# Patient Record
Sex: Female | Born: 1975 | Race: Black or African American | Hispanic: No | Marital: Married | Smoking: Never smoker
Health system: Southern US, Community
[De-identification: ages and names within clinical notes are randomized; demographics above are authoritative.]

## PROBLEM LIST (undated history)

## (undated) DIAGNOSIS — K760 Fatty (change of) liver, not elsewhere classified: Secondary | ICD-10-CM

## (undated) DIAGNOSIS — Z8619 Personal history of other infectious and parasitic diseases: Secondary | ICD-10-CM

## (undated) DIAGNOSIS — E119 Type 2 diabetes mellitus without complications: Secondary | ICD-10-CM

## (undated) DIAGNOSIS — M7989 Other specified soft tissue disorders: Secondary | ICD-10-CM

## (undated) DIAGNOSIS — K5900 Constipation, unspecified: Secondary | ICD-10-CM

## (undated) DIAGNOSIS — G43909 Migraine, unspecified, not intractable, without status migrainosus: Secondary | ICD-10-CM

## (undated) DIAGNOSIS — I10 Essential (primary) hypertension: Secondary | ICD-10-CM

## (undated) DIAGNOSIS — K824 Cholesterolosis of gallbladder: Secondary | ICD-10-CM

## (undated) DIAGNOSIS — E7800 Pure hypercholesterolemia, unspecified: Secondary | ICD-10-CM

## (undated) DIAGNOSIS — K429 Umbilical hernia without obstruction or gangrene: Principal | ICD-10-CM

## (undated) DIAGNOSIS — D649 Anemia, unspecified: Secondary | ICD-10-CM

## (undated) DIAGNOSIS — K7689 Other specified diseases of liver: Secondary | ICD-10-CM

## (undated) HISTORY — DX: Essential (primary) hypertension: I10

## (undated) HISTORY — DX: Fatty (change of) liver, not elsewhere classified: K76.0

## (undated) HISTORY — DX: Cholesterolosis of gallbladder: K82.4

## (undated) HISTORY — DX: Pure hypercholesterolemia, unspecified: E78.00

## (undated) HISTORY — DX: Constipation, unspecified: K59.00

## (undated) HISTORY — DX: Migraine, unspecified, not intractable, without status migrainosus: G43.909

## (undated) HISTORY — DX: Other specified diseases of liver: K76.89

## (undated) HISTORY — DX: Anemia, unspecified: D64.9

## (undated) HISTORY — DX: Umbilical hernia without obstruction or gangrene: K42.9

## (undated) HISTORY — DX: Personal history of other infectious and parasitic diseases: Z86.19

## (undated) HISTORY — DX: Other specified soft tissue disorders: M79.89

---

## 2005-07-21 HISTORY — PX: HERNIA REPAIR: SHX51

## 2006-02-18 ENCOUNTER — Ambulatory Visit: Payer: Self-pay

## 2006-04-02 ENCOUNTER — Other Ambulatory Visit: Payer: Self-pay

## 2006-04-09 ENCOUNTER — Ambulatory Visit: Payer: Self-pay | Admitting: General Surgery

## 2009-11-29 ENCOUNTER — Ambulatory Visit: Payer: Self-pay | Admitting: Unknown Physician Specialty

## 2012-07-15 ENCOUNTER — Emergency Department: Payer: Self-pay | Admitting: Emergency Medicine

## 2012-07-15 LAB — URINALYSIS, COMPLETE
Bilirubin,UR: NEGATIVE
Blood: NEGATIVE
Nitrite: NEGATIVE
Protein: NEGATIVE
RBC,UR: <1 /HPF (ref 0–5)
Specific Gravity: 1.018 (ref 1.003–1.030)
Squamous Epithelial: 5

## 2012-07-15 LAB — CBC
MCH: 26.1 pg (ref 26.0–34.0)
MCHC: 31.5 g/dL — ABNORMAL LOW (ref 32.0–36.0)
Platelet: 337 10*3/uL (ref 150–440)
RBC: 4.52 10*6/uL (ref 3.80–5.20)
WBC: 8.7 10*3/uL (ref 3.6–11.0)

## 2012-07-15 LAB — COMPREHENSIVE METABOLIC PANEL
Albumin: 3.5 g/dL (ref 3.4–5.0)
Anion Gap: 7 (ref 7–16)
BUN: 7 mg/dL (ref 7–18)
Bilirubin,Total: 0.4 mg/dL (ref 0.2–1.0)
Calcium, Total: 8.9 mg/dL (ref 8.5–10.1)
Chloride: 106 mmol/L (ref 98–107)
Creatinine: 0.88 mg/dL (ref 0.60–1.30)
EGFR (African American): >60
EGFR (Non-African Amer.): >60
Glucose: 92 mg/dL (ref 65–99)
Osmolality: 273 (ref 275–301)
Potassium: 3.7 mmol/L (ref 3.5–5.1)
Sodium: 138 mmol/L (ref 136–145)
Total Protein: 8.1 g/dL (ref 6.4–8.2)

## 2012-07-15 LAB — LIPASE, BLOOD: Lipase: 73 U/L (ref 73–393)

## 2012-07-21 DIAGNOSIS — K429 Umbilical hernia without obstruction or gangrene: Secondary | ICD-10-CM

## 2012-07-21 HISTORY — DX: Umbilical hernia without obstruction or gangrene: K42.9

## 2012-10-12 ENCOUNTER — Encounter: Payer: Self-pay | Admitting: Internal Medicine

## 2012-10-12 ENCOUNTER — Ambulatory Visit (INDEPENDENT_AMBULATORY_CARE_PROVIDER_SITE_OTHER): Payer: No Typology Code available for payment source | Admitting: Internal Medicine

## 2012-10-12 DIAGNOSIS — I10 Essential (primary) hypertension: Secondary | ICD-10-CM

## 2012-10-12 DIAGNOSIS — N946 Dysmenorrhea, unspecified: Secondary | ICD-10-CM

## 2012-10-12 NOTE — Patient Instructions (Addendum)
  Return for fasting labs at your convenience  Lab appt)  Schedule follow up visit a few days later  Ask hubby about snoring and breathing pattern  Food diary 3 days   Referral to Dr Evelene Croon for evaluation of fibroids

## 2012-10-12 NOTE — Progress Notes (Signed)
Patient ID: Nicole Burns, female   DOB: 24-Nov-1975, 37 y.o.   MRN: [id number]    Patient Active Problem List  Diagnosis  . Obesity, morbid  . Dysmenorrhea  . Essential hypertension, benign    Subjective:  CC:   Chief Complaint  Patient presents with  . Establish Care    HPI:   Nicole Burns is a 37 y.o. female who presents as a new patient to establish primary care with the chief complaint of Transferring from [facility].  No labs in over one year.    1) Hypertension, diastolic. History of e and levated bp with each pregnancy which would resolve.  Not taking hctz every day home (bp checks usually 134/90)   bc it gives her cramps and headaches. Snores. Has low energy during the day,  Has tried taking vitamins.  Has changed diet since her father came to live with her July last year after his hospitalization  For CABG x 4 preceded by a  hip fracture . Will and a will also has a history of Diabetes, tobacco abuse.   2) dysmenorrhea and in her menstrual cycle is regular but her bleeds are very heavy and accompanied by extreme irritability and severe cramping. She's had prior evaluation with transvaginal ultrasound which showed fibroids. This was done by by Dr. Barnabas Lister who attempted to place a Mirena but was unable to do so because of the pelvic anatomy.  She has 2 children and has no interest in enlarging her family. She is ready to have a hysterectomy..  Gets very cold during menses but has not been told she has developed anemia.  Wants to see Dr Logan Bores or Dr. Evelene Croon.Marland Kitchen   3) Reduced libido:  She fears intercourse because it is painful to her fibroid uterus. She has pain both during and after intercourse.      4) lower extremity edema:  Develops fluid retention on left ankle > right pre menses.  Uses the hctz for that. Had a 24 hour period of recurrent stabbing chest pain which started under her left scapula and radiated to left breast /chest wall during her last cycle.. Lasted  about 24 hours, resolved with use of diuretic.   5) Morbid obesity:  High school wt was 195.  Dropped to 160 after first pregnancy at age 67, then started Depo Provera and has been steadily gaining weight ever since.  After second pregnancy baseline became 200,  Which represents a 40 lb wt gain in ten years.  Dr Welton Flakes tried an appetite suppressant and diet plan which was moderately successful until she had a episode of SVT which resulted in stopping the medication.  She had some success with  Wt Watchers on her 2nd attempt because the knees were done at work during her lunch break up. However when meetings were no longer held at work she lost her momentum and has been unable to lose any weight since then. She has considered gastric bypass but would like to avoid surgery if possible.     6) Has some insomnia issues due to home life ,  She is troubled by the recent unusual behavior over 16 year old daughter who is living at home and beginning to rebel.     Past Medical History  Diagnosis Date  . History of chicken pox   . Hypertension   . Migraine     After menses cycle.     Past Surgical History  Procedure Laterality Date  . Hernia repair  2005  .  Cesarean section      1993 & 2003    Family History  Problem Relation Age of Onset  . Hypertension Mother   . Hyperlipidemia Father   . Heart disease Father   . Stroke Father   . Hypertension Father   . Diabetes Father   . Hyperlipidemia Maternal Grandmother   . Cancer Maternal Grandmother     Breast  . Kidney disease Paternal Grandmother   . Diabetes Paternal Grandmother     History   Social History  . Marital Status: Married    Spouse Name: N/A    Number of Children: N/A  . Years of Education: N/A   Occupational History  . Not on file.   Social History Main Topics  . Smoking status: Never Smoker   . Smokeless tobacco: Not on file  . Alcohol Use: No  . Drug Use: No  . Sexually Active: Yes   Other Topics Concern  . Not  on file   Social History Narrative  . No narrative on file   No Known Allergies  Review of Systems:   Patient denies headache, fevers, malaise, unintentional weight loss, skin rash, eye pain, sinus congestion and sinus pain, sore throat, dysphagia,  hemoptysis , cough, dyspnea, wheezing, chest pain, palpitations, orthopnea, edema, abdominal pain, nausea, melena, diarrhea, constipation, flank pain, dysuria, hematuria, urinary  Frequency, nocturia, numbness, tingling, seizures,  Focal weakness, Loss of consciousness,  Tremor, insomnia, depression, anxiety, and suicidal ideation.       Objective:  BP 134/90  Pulse 79  Temp(Src) 98.6 F (37 C) (Oral)  Resp 16  Ht 5' 4.25" (1.632 m)  Wt 271 lb 8 oz (123.152 kg)  BMI 46.24 kg/m2  SpO2 97%  LMP 10/05/2012  General appearance: alert, cooperative and appears stated age Ears: normal TM's and external ear canals both ears Throat: lips, mucosa, and tongue normal; teeth and gums normal Neck: no adenopathy, no carotid bruit, supple, symmetrical, trachea midline and thyroid not enlarged, symmetric, no tenderness/mass/nodules Back: symmetric, no curvature. ROM normal. No CVA tenderness. Lungs: clear to auscultation bilaterally Heart: regular rate and rhythm, S1, S2 normal, no murmur, click, rub or gallop Abdomen: soft, non-tender; bowel sounds normal; no masses,  no organomegaly Pulses: 2+ and symmetric Skin: Skin color, texture, turgor normal. No rashes or lesions Lymph nodes: Cervical, supraclavicular, and axillary nodes normal.  Assessment and Plan:  Obesity, morbid I have addressed  BMI and recommended a low glycemic index diet utilizing smaller more frequent meals to increase metabolism.  I have also recommended that patient start exercising with a goal of 30 minutes of aerobic exercise a minimum of 5 days per week. Screening for lipid disorders, thyroid and diabetes to be done today.    Dysmenorrhea With history of fibroid uterus  causing severe cramping , dyspareunia and heavy menses. Referral to Dr. Evelene Croon for consideration of  hysterectomy.  Essential hypertension, benign Mostly diastolic with history of hypertension during pregnancy. Return for for assessment of renal function .  She has not tolerated hydrochlorothiazide in the past due to cramping. We'll assess renal function and electrolytes and consider ACE inhibitor    A total of 60 minutes was spent with patient more than half of which was spent in counseling, reviewing records from other prviders and coordination of care. Updated Medication List  Outpatient Encounter Prescriptions as of 10/12/2012  Medication Sig Dispense Refill  . hydrochlorothiazide (HYDRODIURIL) 25 MG tablet Pt takes 1 tablet every other day, due to side effects.      Marland Kitchen  Multiple Vitamin (MULTIVITAMIN) tablet Take 1 tablet by mouth daily.      . pantoprazole (PROTONIX) 40 MG tablet        No facility-administered encounter medications on file as of 10/12/2012.     Orders Placed This Encounter  Procedures  . Ambulatory referral to Gynecology    No Follow-up on file.

## 2012-10-13 ENCOUNTER — Encounter: Payer: Self-pay | Admitting: Internal Medicine

## 2012-10-13 DIAGNOSIS — I10 Essential (primary) hypertension: Secondary | ICD-10-CM | POA: Insufficient documentation

## 2012-10-13 DIAGNOSIS — N943 Premenstrual tension syndrome: Secondary | ICD-10-CM | POA: Insufficient documentation

## 2012-10-13 NOTE — Assessment & Plan Note (Signed)
Mostly diastolic with history of hypertension during pregnancy. Return for for assessment of renal function .  She has not tolerated hydrochlorothiazide in the past due to cramping. We'll assess renal function and electrolytes and consider ACE inhibitor

## 2012-10-13 NOTE — Assessment & Plan Note (Signed)
I have addressed  BMI and recommended a low glycemic index diet utilizing smaller more frequent meals to increase metabolism.  I have also recommended that patient start exercising with a goal of 30 minutes of aerobic exercise a minimum of 5 days per week. Screening for lipid disorders, thyroid and diabetes to be done today.   

## 2012-10-13 NOTE — Assessment & Plan Note (Signed)
With history of fibroid uterus causing severe cramping , dyspareunia and heavy menses. Referral to Dr. Evelene Croon for consideration of  hysterectomy.

## 2012-10-26 ENCOUNTER — Telehealth: Payer: Self-pay | Admitting: *Deleted

## 2012-10-26 DIAGNOSIS — R739 Hyperglycemia, unspecified: Secondary | ICD-10-CM

## 2012-10-26 NOTE — Telephone Encounter (Signed)
Pt is coming in for labs tomorrow 04.09.2014 what labs and dx code would you like?

## 2012-10-26 NOTE — Telephone Encounter (Signed)
Labs entered, thanks!

## 2012-10-27 ENCOUNTER — Other Ambulatory Visit (INDEPENDENT_AMBULATORY_CARE_PROVIDER_SITE_OTHER): Payer: No Typology Code available for payment source

## 2012-10-27 DIAGNOSIS — R739 Hyperglycemia, unspecified: Secondary | ICD-10-CM

## 2012-10-27 DIAGNOSIS — R7309 Other abnormal glucose: Secondary | ICD-10-CM

## 2012-10-27 LAB — COMPREHENSIVE METABOLIC PANEL
Albumin: 3.7 g/dL (ref 3.5–5.2)
Alkaline Phosphatase: 59 U/L (ref 39–117)
BUN: 10 mg/dL (ref 6–23)
CO2: 24 mEq/L (ref 19–32)
Calcium: 9 mg/dL (ref 8.4–10.5)
GFR: 86.3 mL/min (ref >60.00)
Glucose, Bld: 118 mg/dL — ABNORMAL HIGH (ref 70–99)
Potassium: 3.9 mEq/L (ref 3.5–5.1)

## 2012-10-27 LAB — LIPID PANEL
Cholesterol: 135 mg/dL (ref 0–200)
LDL Cholesterol: 91 mg/dL (ref 0–99)
Triglycerides: 55 mg/dL (ref 0.0–149.0)

## 2012-10-27 LAB — TSH: TSH: 1.1 u[IU]/mL (ref 0.35–5.50)

## 2012-10-29 ENCOUNTER — Encounter: Payer: Self-pay | Admitting: Internal Medicine

## 2012-10-29 ENCOUNTER — Ambulatory Visit (INDEPENDENT_AMBULATORY_CARE_PROVIDER_SITE_OTHER): Payer: No Typology Code available for payment source | Admitting: Internal Medicine

## 2012-10-29 VITALS — BP 138/85 | HR 95 | Temp 98.7°F | Resp 16 | Wt 268.0 lb

## 2012-10-29 DIAGNOSIS — I10 Essential (primary) hypertension: Secondary | ICD-10-CM

## 2012-10-29 DIAGNOSIS — R197 Diarrhea, unspecified: Secondary | ICD-10-CM

## 2012-10-29 DIAGNOSIS — Z1239 Encounter for other screening for malignant neoplasm of breast: Secondary | ICD-10-CM

## 2012-10-29 DIAGNOSIS — R7309 Other abnormal glucose: Secondary | ICD-10-CM

## 2012-10-29 MED ORDER — LISINOPRIL-HYDROCHLOROTHIAZIDE 10-12.5 MG PO TABS: 1.0000 | ORAL_TABLET | Freq: Every day | ORAL | Status: DC

## 2012-10-29 NOTE — Progress Notes (Signed)
Patient ID: Nicole Burns, female   DOB: [birth year], 37 y.o.   MRN: [id number]  Patient Active Problem List  Diagnosis  . Obesity, morbid  . Dysmenorrhea  . Essential hypertension, benign  . Other abnormal glucose  . Diarrhea of presumed infectious origin    Subjective:  CC:   Chief Complaint  Patient presents with  . Follow-up    From Inital visit    HPI:   Nicole Mitchellis a 37 y.o. female who presents 1 month follow up on hypertension, dysmennorha, edema and obesity.  She is recovering from a Gi illness which started after eating hibachi vegetables with cooked shrimp at [employer]. She had recurrent diarrhea and vomiting for one week but was able to  Maintain hydration.  The vomiting has resolved., but she continues to have two or three liquid stools daily.  She lost 3 lbs because of the illness.  She was referred to GYN for consideration of hysterectomy secondary to severe dysmenorrha and dyspareunia secondary to large fibroid uterus.  Dr. Evelene Croon plants to do a cryoablation on April 20th per patient under conscious sedation.  No notes available,.  She is morbidly obese but has no known history of sleep apnea, although she has hypertension and edema .    Past Medical History  Diagnosis Date  . History of chicken pox   . Hypertension   . Migraine     After menses cycle.     Past Surgical History  Procedure Laterality Date  . Hernia repair  2005  . Cesarean section      1993 & 2003       The following portions of the patient's history were reviewed and updated as appropriate: Allergies, current medications, and problem list.    Review of Systems:   Patient denies headache, fevers, malaise, unintentional weight loss, skin rash, eye pain, sinus congestion and sinus pain, sore throat, dysphagia,  hemoptysis , cough, dyspnea, wheezing, chest pain, palpitations, orthopnea, edema, abdominal pain, nausea, melena, diarrhea, constipation, flank pain,  dysuria, hematuria, urinary  Frequency, nocturia, numbness, tingling, seizures,  Focal weakness, Loss of consciousness,  Tremor, insomnia, depression, anxiety, and suicidal ideation.     History   Social History  . Marital Status: Married    Spouse Name: N/A    Number of Children: N/A  . Years of Education: N/A   Occupational History  . Not on file.   Social History Main Topics  . Smoking status: Never Smoker   . Smokeless tobacco: Not on file  . Alcohol Use: No  . Drug Use: No  . Sexually Active: Yes   Other Topics Concern  . Not on file   Social History Narrative  . No narrative on file    Objective:  BP 138/85  Pulse 95  Temp(Src) 98.7 F (37.1 C) (Oral)  Resp 16  Wt 268 lb (121.564 kg)  BMI 45.64 kg/m2  SpO2 98%  LMP 10/05/2012  General appearance: alert, cooperative and appears stated age Ears: normal TM's and external ear canals both ears Throat: lips, mucosa, and tongue normal; teeth and gums normal Neck: no adenopathy, no carotid bruit, supple, symmetrical, trachea midline and thyroid not enlarged, symmetric, no tenderness/mass/nodules Back: symmetric, no curvature. ROM normal. No CVA tenderness. Lungs: clear to auscultation bilaterally Heart: regular rate and rhythm, S1, S2 normal, no murmur, click, rub or gallop Abdomen: soft, non-tender; bowel sounds normal; no masses,  no organomegaly Pulses: 2+ and symmetric Skin: Skin color, texture, turgor normal. No rashes  or lesions Lymph nodes: Cervical, supraclavicular, and axillary nodes normal.  Assessment and Plan:  Obesity, morbid Screening done at first visit shows normal cholesterol and thyroid function, and abnormal fasting glucose of 118 and hgba1c of 6.1 Low GI diet and exercise discussed at length today.  Diet handout given.   Essential hypertension, benign Given borderline dx of DM,  Starting lisinopril/hct .  Asked to return for BMET one week after starting ACE inhibitor to monitor renal  function and K.   Diarrhea of presumed infectious origin Nonbloody, without fevers,  Occurring after local restaurant dining but without fevers and bloody stools unlikely to be shigella or salmonella, still  Could be campylobacter.  Recommended stool cultures if still present on Sunday.  Probiotics and metamucil advised to help bulk up stools    Updated Medication List Outpatient Encounter Prescriptions as of 10/29/2012  Medication Sig Dispense Refill  . hydrochlorothiazide (HYDRODIURIL) 25 MG tablet Pt takes 1 tablet every other day, due to side effects.      Marland Kitchen lisinopril-hydrochlorothiazide (ZESTORETIC) 10-12.5 MG per tablet Take 1 tablet by mouth daily.  30 tablet  2  . Multiple Vitamin (MULTIVITAMIN) tablet Take 1 tablet by mouth daily.      . pantoprazole (PROTONIX) 40 MG tablet        No facility-administered encounter medications on file as of 10/29/2012.     Orders Placed This Encounter  Procedures  . Stool C-Diff Toxin Assay  . Stool Culture  . Stool Giardia/Cryptosporidium  . MM Digital Screening  . Stool, WBC/Lactoferrin  . Basic metabolic panel    No Follow-up on file.

## 2012-10-29 NOTE — Patient Instructions (Addendum)
When you make the switch from hctz to lisinopril/hctz,  Return for blood test (nonfasting ) 1 week later.    I would like you to lose 10%  Of your current body weight over the next 6 months ( to help prevent diabetes )   This is  my version of a  "Low GI"  Diet:  It is not ultra low carb, but will still lower your blood sugars and allow you to lose 5 to 10 lbs per month if you follow it carefully. All of the foods can be found at grocery stores and in bulk at [employer].  The Atkins protein bars and shakes are available in more varieties at [employer], [employer] and [employer].     7 AM Breakfast:  Low carbohydrate Protein  Shakes (I recommend the EAS AdvantEdge "Carb Control" shakes  Or the low carb shakes by Atkins.   Both are available everywhere:  In  cases at [employer]Js  Or in 4 packs at grocery stores and pharmacies  2.5 carbs  (Alternative is  a toasted Arnold's Sandwhich Thin w/ peanut butter, a "Bagel Thin" with cream cheese and salmon) or  a scrambled egg burrito made with a low carb tortilla .  Avoid cereal and bananas, oatmeal too unless you are cooking the old fashioned kind that takes 30-40 minutes to prepare.  the rest is overly processed, has minimal fiber, and is loaded with carbohydrates!   10 AM: Protein bar by Atkins (the snack size, under 200 cal).  There are many varieties , available widely again or in bulk in limited varieties at BJs)  Other so called "protein bars" tend to be loaded with carbohydrates.  Remember, in food advertising, the word "energy" is synonymous for " carbohydrate."  Lunch: sandwich of [place], (or any lunchmeat, grilled meat or canned tuna), fresh avocado, mayonnaise  and cheese on a lower carbohydrate pita bread, flatbread, or tortilla . Ok to use regular mayonnaise. The bread is the only source or carbohydrate that can be decreased (Joseph's makes a pita bread and a flat bread that are 50 cal and 4 net carbs ; Toufayan makes a low carb flatbread that's 100 cal and 9 net  carbs  and  Mission makes a low carb whole wheat tortilla  That is 210 cal and 6 net carbs)  3 PM:  Mid day :  Another protein bar,  Or a  cheese stick (100 cal, 0 carbs),  Or 1 ounce of  almonds, walnuts, pistachios, pecans, peanuts,  Macadamia nuts. Or a Dannon light n Fit greek yogurt, 80 cal 8 net carbs . Avoid "granola"; the dried cranberries and raisins are loaded with carbohydrates. Mixed nuts ok if no raisins or cranberries or dried fruit.      6 PM  Dinner:  "mean and green:"  Meat/chicken/fish or a high protein legume; , with a green salad, and a low GI  Veggie (broccoli, cauliflower, green beans, spinach, brussel sprouts. Lima beans) : Avoid "Low fat dressings, as well as Reyne Dumas and [address]! They are loaded with sugar! Instead use ranch, vinagrette,  Blue cheese, etc.  There is a low carb pasta by Dreamfield's available at [facility] that is acceptable and tastes great. Try Michel Angel's chicken piccata over low carb pasta. The chicken dish is 0 carbs, and can be found in frozen section at BJs and [employer]. Also try [employer]" (pulled pork, no sauce,  0 carbs) and his pot roast.  both are in the refrigerated section at BJs   Dreamfield's makes a low carb pasta only 5 g/serving.  Available at all grocery stores,  And tastes like normal pasta  9 PM snack : Breyer's "low carb" fudgsicle or  ice cream bar (Carb Smart line), or  Weight Watcher's ice cream bar , or another "no sugar added" ice cream;a serving of fresh berries/cherries with whipped cream (Avoid bananas, pineapple, grapes  and watermelon on a regular basis because they are high in sugar)   Remember that snack Substitutions should be less than 10 carbs per serving and meals < 20 carbs. Remember to subtract fiber grams and sugar alcohols to get the "net carbs."

## 2012-10-30 ENCOUNTER — Encounter: Payer: Self-pay | Admitting: Internal Medicine

## 2012-10-31 ENCOUNTER — Encounter: Payer: Self-pay | Admitting: Internal Medicine

## 2012-10-31 DIAGNOSIS — E1169 Type 2 diabetes mellitus with other specified complication: Secondary | ICD-10-CM | POA: Insufficient documentation

## 2012-10-31 DIAGNOSIS — E1159 Type 2 diabetes mellitus with other circulatory complications: Secondary | ICD-10-CM | POA: Insufficient documentation

## 2012-10-31 DIAGNOSIS — R197 Diarrhea, unspecified: Secondary | ICD-10-CM | POA: Insufficient documentation

## 2012-10-31 DIAGNOSIS — E119 Type 2 diabetes mellitus without complications: Secondary | ICD-10-CM | POA: Insufficient documentation

## 2012-10-31 NOTE — Assessment & Plan Note (Signed)
Screening done at first visit shows normal cholesterol and thyroid function, and abnormal fasting glucose of 118 and hgba1c of 6.1 Low GI diet and exercise discussed at length today.  Diet handout given.

## 2012-10-31 NOTE — Assessment & Plan Note (Addendum)
Given borderline dx of DM,  Starting lisinopril/hct .  Asked to return for BMET one week after starting ACE inhibitor to monitor renal function and K.

## 2012-10-31 NOTE — Assessment & Plan Note (Addendum)
Nonbloody, without fevers,  Occurring after local restaurant dining but without fevers and bloody stools unlikely to be shigella or salmonella, still  Could be campylobacter.  Recommended stool cultures if still present on Sunday.  Probiotics and metamucil advised to help bulk up stools

## 2012-12-17 ENCOUNTER — Encounter: Payer: Self-pay | Admitting: Internal Medicine

## 2013-01-14 ENCOUNTER — Other Ambulatory Visit: Payer: Self-pay | Admitting: Adult Health

## 2013-01-14 ENCOUNTER — Ambulatory Visit: Payer: Self-pay | Admitting: Adult Health

## 2013-01-14 ENCOUNTER — Encounter: Payer: Self-pay | Admitting: Adult Health

## 2013-01-14 ENCOUNTER — Ambulatory Visit (INDEPENDENT_AMBULATORY_CARE_PROVIDER_SITE_OTHER): Payer: No Typology Code available for payment source | Admitting: Adult Health

## 2013-01-14 VITALS — BP 132/80 | HR 77 | Temp 98.3°F | Resp 12 | Wt 262.5 lb

## 2013-01-14 DIAGNOSIS — R109 Unspecified abdominal pain: Secondary | ICD-10-CM

## 2013-01-14 DIAGNOSIS — K429 Umbilical hernia without obstruction or gangrene: Secondary | ICD-10-CM

## 2013-01-14 DIAGNOSIS — Z3490 Encounter for supervision of normal pregnancy, unspecified, unspecified trimester: Secondary | ICD-10-CM

## 2013-01-14 DIAGNOSIS — Z331 Pregnant state, incidental: Secondary | ICD-10-CM

## 2013-01-14 LAB — POCT URINE PREGNANCY: Preg Test, Ur: NEGATIVE

## 2013-01-14 LAB — BASIC METABOLIC PANEL
CO2: 29 mEq/L (ref 19–32)
Chloride: 104 mEq/L (ref 96–112)
Creatinine, Ser: 1 mg/dL (ref 0.4–1.2)
Potassium: 3.5 mEq/L (ref 3.5–5.1)
Sodium: 137 mEq/L (ref 135–145)

## 2013-01-14 LAB — HEPATIC FUNCTION PANEL
Albumin: 3.9 g/dL (ref 3.5–5.2)
Alkaline Phosphatase: 57 U/L (ref 39–117)

## 2013-01-14 LAB — CBC WITH DIFFERENTIAL/PLATELET
Basophils Absolute: 0 10*3/uL (ref 0.0–0.1)
Eosinophils Relative: 2.4 % (ref 0.0–5.0)
HCT: 35.9 % — ABNORMAL LOW (ref 36.0–46.0)
Hemoglobin: 11.6 g/dL — ABNORMAL LOW (ref 12.0–15.0)
Lymphs Abs: 2.9 10*3/uL (ref 0.7–4.0)
Monocytes Relative: 9.6 % (ref 3.0–12.0)
Neutro Abs: 5.1 10*3/uL (ref 1.4–7.7)
RDW: 15.4 % — ABNORMAL HIGH (ref 11.5–14.6)

## 2013-01-14 NOTE — Assessment & Plan Note (Signed)
Diffuse throughout abdomen but greater in the epigastric area and left quadrants. Check hCG, CBC, liver panel, metabolic panel and send for CT of the abdomen. Patient also has appointment with GI on Monday. I have advised her to keep that appointment. Will await results for further recommendation and plan of care.

## 2013-01-14 NOTE — Progress Notes (Signed)
  Subjective:    Patient ID: Nicole Burns, female    DOB: 1976-02-09, 37 y.o.   MRN: [id number]  HPI  Patient is a pleasant 37 year old female who presents to clinic with the following concerns: Tuesday - ate fresh fruit for breakfast. Shortly after "felt a war going on in her stomach". She later ate buffalo wings. Developed abdominal pain and vomiting. She was experiencing cramps. She had vomiting on Tuesday night into early Wednesday morning. She then developed diarrhea Thursday x 3. No blood noted. She reports feeling feverish. Patient has no appetite. She has not taken anything OTC. She had some left over Protonix from a previous visit to Urgent care for similar event during Christmas so she has started taking this. Also, patient developed a rash on both lower extremities with this event. She reports the rash feels like a "razor burn". Patient had not shaved. It is painful to touch. She has not taken any new medications. Recently started HCTZ in May. She does not take this on a daily basis.   Current Outpatient Prescriptions on File Prior to Visit  Medication Sig Dispense Refill  . lisinopril-hydrochlorothiazide (ZESTORETIC) 10-12.5 MG per tablet Take 1 tablet by mouth daily.  30 tablet  2  . pantoprazole (PROTONIX) 40 MG tablet Take 40 mg by mouth daily.        No current facility-administered medications on file prior to visit.     Review of Systems  Constitutional: Positive for fever. Negative for chills.  Respiratory: Negative.   Cardiovascular: Negative.   Gastrointestinal: Positive for nausea, vomiting, abdominal pain, diarrhea and abdominal distention. Negative for blood in stool.  Genitourinary: Negative.   Skin: Positive for rash.       Developed rash bilateral LE. The rash was painful. Describes feeling as "razor burn" but she had not shaved.  Allergic/Immunologic: Negative for environmental allergies and food allergies.  Neurological: Positive for headaches.   Psychiatric/Behavioral: Negative.     BP 132/80  Pulse 77  Temp(Src) 98.3 F (36.8 C) (Oral)  Resp 12  Wt 262 lb 8 oz (119.069 kg)  BMI 44.71 kg/m2  SpO2 98%  LMP 01/01/2013    Objective:   Physical Exam  Constitutional: She is oriented to person, place, and time.  Overweight, pleasant female. Appears uncomfortable  Cardiovascular: Normal rate and regular rhythm.   Pulmonary/Chest: Effort normal. No respiratory distress.  Abdominal: Soft. She exhibits no mass. There is tenderness.  Tenderness increased with palpation at the epigastric area and on the left side.  Musculoskeletal: Normal range of motion. She exhibits no edema.  Neurological: She is alert and oriented to person, place, and time. Coordination normal.  Skin: Skin is warm and dry. Rash noted. No erythema.  Pinpoint Papular rash bilateral lower extremities. Mid lower extremity down to right above ankle  Psychiatric: She has a normal mood and affect. Her behavior is normal. Judgment and thought content normal.       Assessment & Plan:

## 2013-01-17 ENCOUNTER — Encounter: Payer: Self-pay | Admitting: General Surgery

## 2013-01-24 ENCOUNTER — Encounter: Payer: Self-pay | Admitting: General Surgery

## 2013-01-24 ENCOUNTER — Ambulatory Visit (INDEPENDENT_AMBULATORY_CARE_PROVIDER_SITE_OTHER): Payer: No Typology Code available for payment source | Admitting: General Surgery

## 2013-01-24 VITALS — BP 132/70 | HR 74 | Resp 14 | Ht 63.0 in | Wt 270.0 lb

## 2013-01-24 DIAGNOSIS — K432 Incisional hernia without obstruction or gangrene: Secondary | ICD-10-CM | POA: Insufficient documentation

## 2013-01-24 DIAGNOSIS — K429 Umbilical hernia without obstruction or gangrene: Secondary | ICD-10-CM | POA: Insufficient documentation

## 2013-01-24 NOTE — Progress Notes (Signed)
Patient ID: Nicole Burns, female   DOB: [birth year], 37 y.o.   MRN: [id number]  Chief Complaint  Patient presents with  . Other    peri-umbilical hernia    HPI Nicole Burns is a 37 y.o. female here today for an umbilical hernia. She states her hernia has been there for years. She has not noticed it getting larger over time. She is non symptomatic at this time. Patient had a CT scan approximately 10 days ago for abdominal pain. The abdominal pain has resolved.   HPI  Past Medical History  Diagnosis Date  . History of chicken pox   . Hypertension   . Migraine     After menses cycle.   Marland Kitchen Umbilical hernia 2014    Past Surgical History  Procedure Laterality Date  . Cesarean section      1993 & 2003  . Hernia repair  2007    Family History  Problem Relation Age of Onset  . Hypertension Mother   . Hyperlipidemia Father   . Heart disease Father   . Stroke Father   . Hypertension Father   . Diabetes Father   . Hyperlipidemia Maternal Grandmother   . Cancer Maternal Grandmother     Breast  . Kidney disease Paternal Grandmother   . Diabetes Paternal Grandmother     Social History History  Substance Use Topics  . Smoking status: Never Smoker   . Smokeless tobacco: Not on file  . Alcohol Use: No    No Known Allergies  Current Outpatient Prescriptions  Medication Sig Dispense Refill  . lisinopril-hydrochlorothiazide (ZESTORETIC) 10-12.5 MG per tablet Take 1 tablet by mouth daily.  30 tablet  2  . pantoprazole (PROTONIX) 40 MG tablet Take 40 mg by mouth daily.        No current facility-administered medications for this visit.    Review of Systems Review of Systems  Constitutional: Negative.   Respiratory: Negative.   Cardiovascular: Negative.     Blood pressure 132/70, pulse 74, resp. rate 14, height 5\' 3"  (1.6 m), weight 270 lb (122.471 kg), last menstrual period 01/01/2013.  Physical Exam Physical Exam  Constitutional: She is oriented to person,  place, and time. She appears well-developed and well-nourished.  Eyes: Conjunctivae are normal. No scleral icterus.  Neck: Trachea normal. No mass and no thyromegaly present.  Cardiovascular: Normal rate, regular rhythm, normal heart sounds and normal pulses.   No murmur heard. Pulmonary/Chest: Effort normal and breath sounds normal.  Abdominal: Soft. Normal appearance and bowel sounds are normal. There is no hepatosplenomegaly. There is no tenderness. A hernia is present.  Umbilical Hernia noted.   Neurological: She is alert and oriented to person, place, and time.  Skin: Skin is warm and dry.    Data Reviewed CT reviewed.   Assessment    In addition to umbilical hernia there is large recurrent ventral hernia below the umbilicus with bowel seen within it.     Plan    Ideal option is an open repair with component separation. This was explained fullyto patient. Reasons, risks and benefits explained in full. Patient wishes to think about this and decide.    Patient to call back when ready to arrange surgery.    SANKAR,SEEPLAPUTHUR G 01/24/2013, 9:41 PM

## 2013-01-24 NOTE — Patient Instructions (Addendum)
Patient advised to have umbilical hernia repair. Patient to call our office back when she is ready to schedule surgery. Patient to contact our office with any new symptoms.    Hernia, Surgical Repair A hernia occurs when an internal organ pushes out through a weak spot in the belly (abdominal) wall muscles. Hernias commonly occur in the groin and around the navel. Hernias often can be pushed back into place (reduced). Most hernias tend to get worse over time. Problems occur when abdominal contents get stuck in the opening (incarcerated hernia). The blood supply gets cut off (strangulated hernia). This is an emergency and needs surgery. Otherwise, hernia repair can be an elective procedure. This means you can schedule this at your convenience when an emergency is not present. Because complications can occur, if you decide to repair the hernia, it is best to do it soon. When it becomes an emergency procedure, there is increased risk of complications after surgery. CAUSES   Heavy lifting.  Obesity.  Prolonged coughing.  Straining to move your bowels.  Hernias can also occur through a cut (incision) by a surgeonafter an abdominal operation. HOME CARE INSTRUCTIONS Before the repair:  Bed rest is not required. You may continue your normal activities, but avoid heavy lifting (more than 10 pounds) or straining. Cough gently. If you are a smoker, it is best to stop. Even the best hernia repair can break down with the continual strain of coughing.  Do not wear anything tight over your hernia. Do not try to keep it in with an outside bandage or truss. These can damage abdominal contents if they are trapped in the hernia sac.  Eat a normal diet. Avoid constipation. Straining over long periods of time to have a bowel movement will increase hernia size. It also can breakdown repairs. If you cannot do this with diet alone, laxatives or stool softeners may be used. PRIOR TO SURGERY, SEEK IMMEDIATE MEDICAL  CARE IF: You have problems (symptoms) of a trapped (incarcerated) hernia. Symptoms include:  An oral temperature above 102 F (38.9 C) develops, or as your caregiver suggests.  Increasing abdominal pain.  Feeling sick to your stomach(nausea) and vomiting.  You stop passing gas or stool.  The hernia is stuck outside the abdomen, looks discolored, feels hard, or is tender.  You have any changes in your bowel habits or in the hernia that is unusual for you. LET YOUR CAREGIVERS KNOW ABOUT THE FOLLOWING:  Allergies.  Medications taken including herbs, eye drops, over the counter medications, and creams.  Use of steroids (by mouth or creams).  Family or personal history of problems with anesthetics or Novocaine.  Possibility of pregnancy, if this applies.  Personal history of blood clots (thrombophlebitis).  Family or personal history of bleeding or blood problems.  Previous surgery.  Other health problems. BEFORE THE PROCEDURE You should be present 1 hour prior to your procedure, or as directed by your caregiver.  AFTER THE PROCEDURE After surgery, you will be taken to the recovery area. A nurse will watch and check your progress there. Once you are awake, stable, and taking fluids well, you will be allowed to go home as long as there are no problems. Once home, an ice pack (wrapped in a light towel) applied to your operative site may help with discomfort. It may also keep the swelling down. Do not lift anything heavier than 10 pounds (4.55 kilograms). Take showers not baths. Do not drive while taking narcotics. Follow instructions as suggested by  your caregiver.  SEEK IMMEDIATE MEDICAL CARE IF: After surgery:  There is redness, swelling, or increasing pain in the wound.  There is pus coming from the wound.  There is drainage from a wound lasting longer than 1 day.  An unexplained oral temperature above 102 F (38.9 C) develops.  You notice a foul smell coming from the  wound or dressing.  There is a breaking open of a wound (edged not staying together) after the sutures have been removed.  You notice increasing pain in the shoulders (shoulder strap areas).  You develop dizzy episodes or fainting while standing.  You develop persistent nausea or vomiting.  You develop a rash.  You have difficulty breathing.  You develop any reaction or side effects to medications given. MAKE SURE YOU:   Understand these instructions.  Will watch your condition.  Will get help right away if you are not doing well or get worse. Document Released: 12/31/2000 Document Revised: 09/29/2011 Document Reviewed: 11/23/2007 [facility] Patient Information 2014 [place], [place].  Patient to call back when ready to arrange surgery.

## 2013-02-01 ENCOUNTER — Encounter: Payer: Self-pay | Admitting: Internal Medicine

## 2013-02-01 ENCOUNTER — Ambulatory Visit (INDEPENDENT_AMBULATORY_CARE_PROVIDER_SITE_OTHER): Payer: No Typology Code available for payment source | Admitting: Internal Medicine

## 2013-02-01 DIAGNOSIS — R7309 Other abnormal glucose: Secondary | ICD-10-CM

## 2013-02-01 DIAGNOSIS — N943 Premenstrual tension syndrome: Secondary | ICD-10-CM

## 2013-02-01 DIAGNOSIS — K429 Umbilical hernia without obstruction or gangrene: Secondary | ICD-10-CM

## 2013-02-01 DIAGNOSIS — I10 Essential (primary) hypertension: Secondary | ICD-10-CM

## 2013-02-01 DIAGNOSIS — N939 Abnormal uterine and vaginal bleeding, unspecified: Secondary | ICD-10-CM

## 2013-02-01 LAB — HM PAP SMEAR: HM Pap smear: NORMAL

## 2013-02-01 MED ORDER — LISINOPRIL 20 MG PO TABS: 20.0000 mg | ORAL_TABLET | Freq: Every day | ORAL | Status: DC

## 2013-02-01 MED ORDER — SPIRONOLACTONE 25 MG PO TABS: 25.0000 mg | ORAL_TABLET | Freq: Every day | ORAL | Status: DC

## 2013-02-01 NOTE — Progress Notes (Signed)
Patient ID: Nicole Burns, female   DOB: September 11, 1975, 37 y.o.   MRN: [id number]  Patient Active Problem List   Diagnosis Date Noted  . Severe obesity (BMI >= 40) 02/03/2013  . Recurrent ventral hernia 01/24/2013  . Umbilical hernia   . Abdominal  pain, other specified site 01/14/2013  . Other abnormal glucose 10/31/2012  . Obesity, morbid 10/13/2012  . Premenstrual symptom 10/13/2012  . Essential hypertension, benign 10/13/2012    Subjective:  CC:   Chief Complaint  Patient presents with  . Follow-up    3 month    HPI:   Nicole Mitchellis a 37 y.o. female who presents Follow up on multiple medical conditons including hypertension, morbid obesity,  And abnormal fastiing glucose.  She was referred to GYN Nicole Burns for consideration of hysterectomy secondary to recurrent dysmenorrhea and menorrhagiaespite trial of oral contraceptives and NuvaRing. A PAP smear and endometrial biopsy was  Done.  Cryoablation was attempted not successful due to position of cervix.  She is not considering hysterectomy at this time due to her obesity.  She was also referred to Nicole. Evette Burns for periumbilical hernia found on CT ordered during any evaluation of abdominal pain by Nicole Burns.  She was told that since she has a recurrent right inguinal hernia it would require open repair and drainage and was not advised at her current BMI.  She is frustrated and tearful and feels that it was not a good interaction with Nicole Burns.  She is motivated to lose weight and has an 39 yr old dtr who is obese as well.     Past Medical History  Diagnosis Date  . History of chicken pox   . Hypertension   . Migraine     After menses cycle.   Marland Kitchen Umbilical hernia 2014    Past Surgical History  Procedure Laterality Date  . Cesarean section      1993 & 2003  . Hernia repair  2007       The following portions of the patient's history were reviewed and updated as appropriate: Allergies, current medications,  and problem list.    Review of Systems:   Patient denies headache, fevers, malaise, unintentional weight loss, skin rash, eye pain, sinus congestion and sinus pain, sore throat, dysphagia,  hemoptysis , cough, dyspnea, wheezing, chest pain, palpitations, orthopnea, edema, abdominal pain, nausea, melena, diarrhea, constipation, flank pain, dysuria, hematuria, urinary  Frequency, nocturia, numbness, tingling, seizures,  Focal weakness, Loss of consciousness,  Tremor, insomnia, depression, anxiety, and suicidal ideation.     History   Social History  . Marital Status: Married    Spouse Name: N/A    Number of Children: N/A  . Years of Education: N/A   Occupational History  . Not on file.   Social History Main Topics  . Smoking status: Never Smoker   . Smokeless tobacco: Not on file  . Alcohol Use: No  . Drug Use: No  . Sexually Active: Yes   Other Topics Concern  . Not on file   Social History Narrative  . No narrative on file    Objective:  BP 142/96  Pulse 84  Temp(Src) 98.9 F (37.2 C) (Oral)  Resp 14  Wt 265 lb 1 oz (120.232 kg)  BMI 46.97 kg/m2  SpO2 93%  LMP 01/28/2013  General appearance: alert, cooperative and appears stated age Ears: normal TM's and external ear canals both ears Throat: lips, mucosa, and tongue normal; teeth and gums normal Neck: no  adenopathy, no carotid bruit, supple, symmetrical, trachea midline and thyroid not enlarged, symmetric, no tenderness/mass/nodules Back: symmetric, no curvature. ROM normal. No CVA tenderness. Lungs: clear to auscultation bilaterally Heart: regular rate and rhythm, S1, S2 normal, no murmur, click, rub or gallop Abdomen: soft, non-tender; bowel sounds normal; no masses,  no organomegaly Pulses: 2+ and symmetric Skin: Skin color, texture, turgor normal. No rashes or lesions Lymph nodes: Cervical, supraclavicular, and axillary nodes normal.  Assessment and Plan:  Obesity, morbid I have addressed  BMI and  recommended a low glycemic index diet utilizing smaller more frequent meals to increase metabolism.  I have also recommended that patient start exercising with a goal of 30 minutes of aerobic exercise a minimum of 5 days per week. She has been given a printed diet and has been referred to a Nicole Burns a [employer], newspaper    Essential hypertension, benign She has been having recurrent muscle cramps on current medications.  HCTZ stopped, Lisinopril dose increased to 20 mg  Daily   Premenstrual symptom She has increased edema prior to menses.  Prn spironolactone   Other abnormal glucose Fasting glucose was 118, a1c was 6.1 in March.  Low glycemic index diet, exercise and wt loss recommended and referrals made.   Umbilical hernia She will lose 10% of her body weight before considering hernia surgery after discussion today  A total of 40 minutes was spent with patient more than half of which was spent in counseling, reviewing records from other prviders and coordination of care.  Updated Medication List Outpatient Encounter Prescriptions as of 02/01/2013  Medication Sig Dispense Refill  . pantoprazole (PROTONIX) 40 MG tablet Take 40 mg by mouth daily.       . [DISCONTINUED] lisinopril-hydrochlorothiazide (ZESTORETIC) 10-12.5 MG per tablet Take 1 tablet by mouth daily.  30 tablet  2  . lisinopril (PRINIVIL,ZESTRIL) 20 MG tablet Take 1 tablet (20 mg total) by mouth daily.  90 tablet  1  . spironolactone (ALDACTONE) 25 MG tablet Take 1 tablet (25 mg total) by mouth daily. As needed for fluid retention  30 tablet  3   No facility-administered encounter medications on file as of 02/01/2013.     No orders of the defined types were placed in this encounter.    No Follow-up on file.

## 2013-02-01 NOTE — Patient Instructions (Addendum)
Stop the lisinopril hct.   Start lisinopril 20  Mg daily  Use spironolactone as needed for fluid retention   This is  One version of a  "Low GI"  Diet:  It will still lower your blood sugars and allow you to lose 4 to 8  lbs  per month if you follow it carefully.  Your goal with exercise is a minimum of 30 minutes of aerobic exercise 5 days per week (Walking does not count once it becomes easy!)    All of the foods can be found at grocery stores and in bulk at [employer].  The Atkins protein bars and shakes are available in more varieties at Target, WalMart and [employer]'s Foods.     7 AM Breakfast:  Choose from the following:  Low carbohydrate Protein  Shakes (I recommend the EAS AdvantEdge "Carb Control" shakes  Or the low carb shakes by Atkins.    2.5 carbs   Arnold's "Sandwhich Thin"toasted  w/ peanut butter (no jelly: about 20 net carbs  "Bagel Thin" with cream cheese and salmon: about 20 carbs   a scrambled egg/bacon/cheese burrito made with Mission's "carb balance" whole wheat tortilla  (about 10 net carbs )   Avoid cereal and bananas, oatmeal and cream of wheat and grits. They are loaded with carbohydrates!   10 AM: high protein snack  Protein bar by Atkins (the snack size, under 200 cal, usually < 6 net carbs).    A stick of cheese:  Around 1 carb,  100 cal     Dannon Light n Fit [place] Yogurt  (80 cal, 8 carbs)  Other so called "protein bars" and Greek yogurts tend to be loaded with carbohydrates.  Remember, in food advertising, the word "energy" is synonymous for " carbohydrate."  Lunch:   A Sandwich using the bread choices listed, Can use any  Eggs,  lunchmeat, grilled meat or canned tuna), avocado, regular mayo/mustard  and cheese.  A Salad using blue cheese, ranch,  Goddess or vinagrette,  No croutons or "confetti" and no "candied nuts" but regular nuts OK.   No pretzels or chips.  Pickles and miniature sweet peppers are a good low carb alternative that provide a "crunch"  The  bread is the only source of carbohydrate in a sandwich and  can be decreased by trying some of these alternatives to traditional loaf bread  Joseph's makes a pita bread and a flat bread that are 50 cal and 4 net carbs available at BJs and WalMart.  This can be toasted to use with hummous as well  Toufayan makes a low carb flatbread that's 100 cal and 9 net carbs available at [employer] and [employer] makes 2 sizes of  Low carb whole wheat tortilla  (The large one is 210 cal and 6 net carbs) Avoid "Low fat dressings, as well as Reyne Dumas and [address] dressings They are loaded with sugar!   3 PM/ Mid day  Snack:  Consider  1 ounce of  almonds, walnuts, pistachios, pecans, peanuts,  Macadamia nuts or a nut medley.  Avoid "granola"; the dried cranberries and raisins are loaded with carbohydrates. Mixed nuts as long as there are no raisins,  cranberries or dried fruit.     6 PM  Dinner:     Meat/fowl/fish with a green salad, and either broccoli, cauliflower, green beans, spinach, brussel sprouts or  Lima beans. DO NOT BREAD THE PROTEIN!!      There is a  low carb pasta by Dreamfield's that is acceptable and tastes great: only 5 digestible carbs/serving.( All grocery stores but BJs carry it )  Try Kai Levins Angelo's chicken piccata or chicken or eggplant parm over low carb pasta.(Lowes and BJs)   Clifton Custard Sanchez's "Carnitas" (pulled pork, no sauce,  0 carbs) or his beef pot roast to make a dinner burrito (at BJ's)  Pesto over low carb pasta (bj's sells a good quality pesto in the center refrigerated section of the deli   Whole wheat pasta is still full of digestible carbs and  Not as low in glycemic index as Dreamfield's.   Brown rice is still rice,  So skip the rice and noodles if you eat [place] or [place] (or at least limit to 1/2 cup)  9 PM snack :   Breyer's "low carb" fudgsicle or  ice cream bar (Carb Smart line), or  Weight Watcher's ice cream bar , or another "no sugar added" ice cream;  a  serving of fresh berries/cherries with whipped cream   Cheese or DANNON'S LlGHT N FIT [place] YOGURT  Avoid bananas, pineapple, grapes  and watermelon on a regular basis because they are high in sugar.  THINK OF THEM AS DESSERT  Remember that snack Substitutions should be less than 10 NET carbs per serving and meals < 20 carbs. Remember to subtract fiber grams to get the "net carbs."

## 2013-02-03 ENCOUNTER — Encounter: Payer: Self-pay | Admitting: Internal Medicine

## 2013-02-03 NOTE — Assessment & Plan Note (Addendum)
She has been having recurrent muscle cramps on current medications.  HCTZ stopped, Lisinopril dose increased to 20 mg  Daily

## 2013-02-03 NOTE — Assessment & Plan Note (Addendum)
She will lose 10% of her body weight before considering hernia surgery after discussion today

## 2013-02-03 NOTE — Assessment & Plan Note (Signed)
I have addressed  BMI and recommended a low glycemic index diet utilizing smaller more frequent meals to increase metabolism.  I have also recommended that patient start exercising with a goal of 30 minutes of aerobic exercise a minimum of 5 days per week. She has been given a printed diet and has been referred to a Nichelle Crump-Murray a [employer], newspaper

## 2013-02-03 NOTE — Assessment & Plan Note (Signed)
She has increased edema prior to menses.  Prn spironolactone

## 2013-02-03 NOTE — Assessment & Plan Note (Signed)
Fasting glucose was 118, a1c was 6.1 in March.  Low glycemic index diet, exercise and wt loss recommended and referrals made.

## 2013-02-10 ENCOUNTER — Encounter: Payer: Self-pay | Admitting: Adult Health

## 2013-05-04 ENCOUNTER — Encounter: Payer: Self-pay | Admitting: Internal Medicine

## 2013-05-04 ENCOUNTER — Ambulatory Visit (INDEPENDENT_AMBULATORY_CARE_PROVIDER_SITE_OTHER): Payer: No Typology Code available for payment source | Admitting: Internal Medicine

## 2013-05-04 VITALS — BP 126/78 | HR 86 | Temp 98.3°F | Resp 12 | Wt 264.5 lb

## 2013-05-04 DIAGNOSIS — R739 Hyperglycemia, unspecified: Secondary | ICD-10-CM

## 2013-05-04 DIAGNOSIS — N920 Excessive and frequent menstruation with regular cycle: Secondary | ICD-10-CM

## 2013-05-04 DIAGNOSIS — Z79899 Other long term (current) drug therapy: Secondary | ICD-10-CM

## 2013-05-04 DIAGNOSIS — R7309 Other abnormal glucose: Secondary | ICD-10-CM

## 2013-05-04 DIAGNOSIS — I10 Essential (primary) hypertension: Secondary | ICD-10-CM

## 2013-05-04 NOTE — Progress Notes (Signed)
Patient ID: Nicole Burns, female   DOB: 02-Aug-1975, 37 y.o.   MRN: [id number]   Patient Active Problem List   Diagnosis Date Noted  . Menorrhagia 05/04/2013  . Severe obesity (BMI >= 40) 02/03/2013  . Recurrent ventral hernia 01/24/2013  . Umbilical hernia   . Abdominal  pain, other specified site 01/14/2013  . Other abnormal glucose 10/31/2012  . Obesity, morbid 10/13/2012  . Premenstrual symptom 10/13/2012  . Essential hypertension, benign 10/13/2012    Subjective:  CC:   Chief Complaint  Patient presents with  . Follow-up    3 month followup    HPI:   Nicole Mitchellis a 37 y.o. female who presents for 3 month follow up on hypertension, obesity and metabolic syndrome.  She has decided to participatein a program at work with other women to lose weight .  The program involves following the Weight Watchers dietary program for 12 weeks planned.  Exercise ?  Not doing yet. Has lost 1 lb thus far   hypertension:  Her medication  makes her sleepy so she is taking lisinopril at night.  Home pressures have been very high.  Past Medical History  Diagnosis Date  . History of chicken pox   . Hypertension   . Migraine     After menses cycle.   Marland Kitchen Umbilical hernia 2014    Past Surgical History  Procedure Laterality Date  . Cesarean section      1993 & 2003  . Hernia repair  2007       The following portions of the patient's history were reviewed and updated as appropriate: Allergies, current medications, and problem list.    Review of Systems:   12 Pt  review of systems was negative except those addressed in the HPI,     History   Social History  . Marital Status: Married    Spouse Name: N/A    Number of Children: N/A  . Years of Education: N/A   Occupational History  . Not on file.   Social History Main Topics  . Smoking status: Never Smoker   . Smokeless tobacco: Not on file  . Alcohol Use: No  . Drug Use: No  . Sexual Activity: Yes   Other  Topics Concern  . Not on file   Social History Narrative  . No narrative on file    Objective:  Filed Vitals:   05/04/13 1541  BP: 126/78  Pulse: 86  Temp: 98.3 F (36.8 C)  Resp: 12     General appearance: alert, cooperative and appears stated age Ears: normal TM's and external ear canals both ears Throat: lips, mucosa, and tongue normal; teeth and gums normal Neck: no adenopathy, no carotid bruit, supple, symmetrical, trachea midline and thyroid not enlarged, symmetric, no tenderness/mass/nodules Back: symmetric, no curvature. ROM normal. No CVA tenderness. Lungs: clear to auscultation bilaterally Heart: regular rate and rhythm, S1, S2 normal, no murmur, click, rub or gallop Abdomen: soft, non-tender; bowel sounds normal; no masses,  no organomegaly Pulses: 2+ and symmetric Skin: Skin color, texture, turgor normal. No rashes or lesions Lymph nodes: Cervical, supraclavicular, and axillary nodes normal.  Assessment and Plan:  Menorrhagia Secondary to fibroids,  Bleeds heavy 4 or 5 days.  hgb was 11.6 in June  Will rechekc with iron.,  Severe obesity (BMI >= 40) I have addressed  BMI and recommended wt loss of 10% of body weight over the next 6 months using a low glycemic index diet and regular exercise  a minimum of 5 days per week.    Essential hypertension, benign Home bps have been elevated by BP is normal here Continue Lisinopril dose increased to 20 mg  Daily.  Adding spironolactone for edema,  Return in 2 weeks for bmet and bring bp cuff    Updated Medication List Outpatient Encounter Prescriptions as of 05/04/2013  Medication Sig Dispense Refill  . lisinopril (PRINIVIL,ZESTRIL) 20 MG tablet Take 1 tablet (20 mg total) by mouth daily.  90 tablet  1  . spironolactone (ALDACTONE) 25 MG tablet Take 1 tablet (25 mg total) by mouth daily. As needed for fluid retention  30 tablet  3  . [DISCONTINUED] pantoprazole (PROTONIX) 40 MG tablet Take 40 mg by mouth daily.         No facility-administered encounter medications on file as of 05/04/2013.     Orders Placed This Encounter  Procedures  . Iron and TIBC  . Ferritin  . CBC with Differential  . Comprehensive metabolic panel  . TSH  . Lipid panel  . Hemoglobin A1c    No Follow-up on file.

## 2013-05-04 NOTE — Patient Instructions (Signed)
Continue the lisinopril in the evening and add the spironolactone daily as well.   We will recheck your potassium when you return with your blood pressure monitor   You need to have a TDap and the influenza vaccine when your return  Return as soon as convenient  for fasting labs and a bp check using your own bp machine

## 2013-05-04 NOTE — Assessment & Plan Note (Signed)
Secondary to fibroids,  Bleeds heavy 4 or 5 days.  hgb was 11.6 in June  Will rechekc with iron.,

## 2013-05-08 NOTE — Assessment & Plan Note (Signed)
I have addressed  BMI and recommended wt loss of 10% of body weight over the next 6 months using a low glycemic index diet and regular exercise a minimum of 5 days per week.   

## 2013-05-08 NOTE — Assessment & Plan Note (Addendum)
Home bps have been elevated by BP is normal here Continue Lisinopril dose increased to 20 mg  Daily.  Adding spironolactone for edema,  Return in 2 weeks for bmet and bring bp cuff

## 2013-05-18 ENCOUNTER — Ambulatory Visit (INDEPENDENT_AMBULATORY_CARE_PROVIDER_SITE_OTHER): Payer: No Typology Code available for payment source | Admitting: *Deleted

## 2013-05-18 ENCOUNTER — Other Ambulatory Visit (INDEPENDENT_AMBULATORY_CARE_PROVIDER_SITE_OTHER): Payer: No Typology Code available for payment source

## 2013-05-18 DIAGNOSIS — N920 Excessive and frequent menstruation with regular cycle: Secondary | ICD-10-CM

## 2013-05-18 DIAGNOSIS — Z23 Encounter for immunization: Secondary | ICD-10-CM

## 2013-05-18 DIAGNOSIS — Z79899 Other long term (current) drug therapy: Secondary | ICD-10-CM

## 2013-05-18 DIAGNOSIS — I10 Essential (primary) hypertension: Secondary | ICD-10-CM

## 2013-05-18 LAB — COMPREHENSIVE METABOLIC PANEL
AST: 14 U/L (ref 0–37)
Albumin: 3.6 g/dL (ref 3.5–5.2)
BUN: 13 mg/dL (ref 6–23)
CO2: 23 mEq/L (ref 19–32)
Calcium: 8.9 mg/dL (ref 8.4–10.5)
Chloride: 105 mEq/L (ref 96–112)
Creatinine, Ser: 0.9 mg/dL (ref 0.4–1.2)
GFR: 86.05 mL/min (ref >60.00)
Glucose, Bld: 109 mg/dL — ABNORMAL HIGH (ref 70–99)
Sodium: 136 mEq/L (ref 135–145)
Total Protein: 7.4 g/dL (ref 6.0–8.3)

## 2013-05-18 LAB — LIPID PANEL
Cholesterol: 133 mg/dL (ref 0–200)
LDL Cholesterol: 85 mg/dL (ref 0–99)
Total CHOL/HDL Ratio: 4
Triglycerides: 55 mg/dL (ref 0.0–149.0)

## 2013-05-18 LAB — CBC WITH DIFFERENTIAL/PLATELET
Basophils Absolute: 0 10*3/uL (ref 0.0–0.1)
Basophils Relative: 0.5 % (ref 0.0–3.0)
Eosinophils Absolute: 0.1 10*3/uL (ref 0.0–0.7)
HCT: 32.3 % — ABNORMAL LOW (ref 36.0–46.0)
Hemoglobin: 10.4 g/dL — ABNORMAL LOW (ref 12.0–15.0)
Lymphocytes Relative: 31.1 % (ref 12.0–46.0)
Lymphs Abs: 2.3 10*3/uL (ref 0.7–4.0)
MCV: 81.8 fl (ref 78.0–100.0)
Monocytes Relative: 10.4 % (ref 3.0–12.0)
Neutro Abs: 4.2 10*3/uL (ref 1.4–7.7)
Platelets: 288 10*3/uL (ref 150.0–400.0)
RBC: 3.95 Mil/uL (ref 3.87–5.11)
RDW: 15.5 % — ABNORMAL HIGH (ref 11.5–14.6)

## 2013-05-18 LAB — FERRITIN: Ferritin: 13.5 ng/mL (ref 10.0–291.0)

## 2013-05-18 LAB — HEMOGLOBIN A1C: Hgb A1c MFr Bld: 5.9 % (ref 4.6–6.5)

## 2013-05-18 LAB — TSH: TSH: 0.93 u[IU]/mL (ref 0.35–5.50)

## 2013-05-19 LAB — IRON AND TIBC
%SAT: 9 % — ABNORMAL LOW (ref 20–55)
Iron: 30 ug/dL — ABNORMAL LOW (ref 42–145)
TIBC: 336 ug/dL (ref 250–470)
UIBC: 306 ug/dL (ref 125–400)

## 2013-08-05 ENCOUNTER — Telehealth: Payer: Self-pay | Admitting: *Deleted

## 2013-08-05 NOTE — Telephone Encounter (Signed)
Pt is coming in on Monday what labs and dx? 

## 2013-08-08 ENCOUNTER — Other Ambulatory Visit (INDEPENDENT_AMBULATORY_CARE_PROVIDER_SITE_OTHER): Payer: No Typology Code available for payment source

## 2013-08-08 ENCOUNTER — Telehealth: Payer: Self-pay | Admitting: *Deleted

## 2013-08-08 DIAGNOSIS — D649 Anemia, unspecified: Secondary | ICD-10-CM

## 2013-08-08 LAB — CBC WITH DIFFERENTIAL/PLATELET
BASOS ABS: 0 10*3/uL (ref 0.0–0.1)
Basophils Relative: 0.5 % (ref 0.0–3.0)
EOS ABS: 0.1 10*3/uL (ref 0.0–0.7)
Eosinophils Relative: 1.5 % (ref 0.0–5.0)
HCT: 32.7 % — ABNORMAL LOW (ref 36.0–46.0)
Hemoglobin: 10.3 g/dL — ABNORMAL LOW (ref 12.0–15.0)
LYMPHS PCT: 31.3 % (ref 12.0–46.0)
Lymphs Abs: 2.6 10*3/uL (ref 0.7–4.0)
MCHC: 31.6 g/dL (ref 30.0–36.0)
MCV: 80.7 fl (ref 78.0–100.0)
Monocytes Absolute: 1 10*3/uL (ref 0.1–1.0)
Monocytes Relative: 11.4 % (ref 3.0–12.0)
NEUTROS ABS: 4.6 10*3/uL (ref 1.4–7.7)
Neutrophils Relative %: 55.3 % (ref 43.0–77.0)
Platelets: 308 10*3/uL (ref 150.0–400.0)
RBC: 4.05 Mil/uL (ref 3.87–5.11)
RDW: 16.5 % — AB (ref 11.5–14.6)
WBC: 8.4 10*3/uL (ref 4.5–10.5)

## 2013-08-08 NOTE — Telephone Encounter (Signed)
What labs and dx?  

## 2013-08-08 NOTE — Telephone Encounter (Signed)
She came back too early to do the A1c , but everything else is ordered.  When patients are told to return in 3 months,  They need to  Avoid coming early .  Any ideas how we can prevent this ?

## 2013-08-09 LAB — COMPREHENSIVE METABOLIC PANEL
ALT: 12 U/L (ref 0–35)
AST: 13 U/L (ref 0–37)
Albumin: 3.9 g/dL (ref 3.5–5.2)
Alkaline Phosphatase: 55 U/L (ref 39–117)
BUN: 12 mg/dL (ref 6–23)
CO2: 24 mEq/L (ref 19–32)
Calcium: 9 mg/dL (ref 8.4–10.5)
Chloride: 108 mEq/L (ref 96–112)
Creat: 0.93 mg/dL (ref 0.50–1.10)
Glucose, Bld: 103 mg/dL — ABNORMAL HIGH (ref 70–99)
Potassium: 4.6 mEq/L (ref 3.5–5.3)
Sodium: 136 mEq/L (ref 135–145)
Total Bilirubin: 0.5 mg/dL (ref 0.3–1.2)
Total Protein: 7.1 g/dL (ref 6.0–8.3)

## 2013-08-09 LAB — IRON AND TIBC
%SAT: 11 % — ABNORMAL LOW (ref 20–55)
Iron: 40 ug/dL — ABNORMAL LOW (ref 42–145)
TIBC: 365 ug/dL (ref 250–470)
UIBC: 325 ug/dL (ref 125–400)

## 2013-08-09 LAB — FERRITIN: Ferritin: 12 ng/mL (ref 10–291)

## 2013-08-15 ENCOUNTER — Ambulatory Visit (INDEPENDENT_AMBULATORY_CARE_PROVIDER_SITE_OTHER): Payer: No Typology Code available for payment source | Admitting: Internal Medicine

## 2013-08-15 ENCOUNTER — Encounter: Payer: Self-pay | Admitting: Internal Medicine

## 2013-08-15 VITALS — BP 126/100 | HR 89 | Temp 98.2°F | Ht 64.25 in | Wt 265.4 lb

## 2013-08-15 DIAGNOSIS — R7309 Other abnormal glucose: Secondary | ICD-10-CM

## 2013-08-15 DIAGNOSIS — I10 Essential (primary) hypertension: Secondary | ICD-10-CM

## 2013-08-15 NOTE — Patient Instructions (Signed)
Your blood pressure is elevated today, which may be due to the fluid retention.  When you take the spironolactone you can suspend the lisinopril if your bp is still < 130/80.  If it is not,  Take 1/2 lisinopril tablet  You do not have diabetes,  But you are "borderline"  Losing weight through diet and exercise may prevent the development of diabetes   This is  my version of a  "Low GI"  Diet:  It will still lower your blood sugars and allow you to lose 4 to 8  lbs  per month if you follow it carefully.  Your goal with exercise is a minimum of 30 minutes of aerobic exercise 5 days per week (Walking does not count once it becomes easy!)    All of the foods can be found at grocery stores and in bulk at [employer].  The Atkins protein bars and shakes are available in more varieties at Target, WalMart and [place].     7 AM Breakfast:  Choose from the following:  Low carbohydrate Protein  Shakes (I recommend the EAS AdvantEdge "Carb Control" shakes  Or the low carb shakes by Atkins.    2.5 carbs   Arnold's "Sandwhich Thin"toasted  w/ peanut butter (no jelly: about 20 net carbs  "Bagel Thin" with cream cheese and salmon: about 20 carbs   a scrambled egg/bacon/cheese burrito made with [employer] "carb balance" whole wheat tortilla  (about 10 net carbs )   Avoid cereal and bananas, oatmeal and cream of wheat and grits. They are loaded with carbohydrates!   10 AM: high protein snack  Protein bar by Atkins (the snack size, under 200 cal, usually < 6 net carbs).    A stick of cheese:  Around 1 carb,  100 cal     Dannon Light n Fit [place] Yogurt  (80 cal, 8 carbs)  Other so called "protein bars" and Greek yogurts tend to be loaded with carbohydrates.  Remember, in food advertising, the word "energy" is synonymous for " carbohydrate."  Lunch:   A Sandwich using the bread choices listed, Can use any  Eggs,  lunchmeat, grilled meat or canned tuna), avocado, regular mayo/mustard  and cheese.  A Salad  using blue cheese, ranch,  Goddess or vinagrette,  No croutons or "confetti" and no "candied nuts" but regular nuts OK.   No pretzels or chips.  Pickles and miniature sweet peppers are a good low carb alternative that provide a "crunch"  The bread is the only source of carbohydrate in a sandwich and  can be decreased by trying some of these alternatives to traditional loaf bread  Joseph's makes a pita bread and a flat bread that are 50 cal and 4 net carbs available at [place] and [place].  This can be toasted to use with hummous as well  Toufayan makes a low carb flatbread that's 100 cal and 9 net carbs available at [employer] and [employer]J's makes 2 sizes of  Low carb whole wheat tortilla  (The large one is 210 cal and 6 net carbs) Avoid "Low fat dressings, as well as Barry Brunner and [place] dressings They are loaded with sugar!   3 PM/ Mid day  Snack:  Consider  1 ounce of  almonds, walnuts, pistachios, pecans, peanuts,  Macadamia nuts or a nut medley.  Avoid "granola"; the dried cranberries and raisins are loaded with carbohydrates. Mixed nuts as long as there are no raisins,  cranberries  or dried fruit.     6 PM  Dinner:     Meat/fowl/fish with a green salad, and either broccoli, cauliflower, green beans, spinach, brussel sprouts or  Lima beans. DO NOT BREAD THE PROTEIN!!      There is a low carb pasta by Dreamfield's that is acceptable and tastes great: only 5 digestible carbs/serving.( All grocery stores but BJs carry it )  Try Hurley Cisco Angelo's chicken piccata or chicken or eggplant parm over low carb pasta.(Lowes and BJs)   Marjory Lies Sanchez's "Carnitas" (pulled pork, no sauce,  0 carbs) or his beef pot roast to make a dinner burrito (at [employer]J's)  Pesto over low carb pasta (bj's sells a good quality pesto in the center refrigerated section of the deli   Whole wheat pasta is still full of digestible carbs and  Not as low in glycemic index as Dreamfield's.   Brown rice is still rice,  So skip  the rice and noodles if you eat [place] or [place] (or at least limit to 1/2 cup)  9 PM snack :   Breyer's "low carb" fudgsicle or  ice cream bar (Carb Smart line), or  Weight Watcher's ice cream bar , or another "no sugar added" ice cream;  a serving of fresh berries/cherries with whipped cream   Cheese or DANNON'S LlGHT N FIT GREEK YOGURT  Avoid bananas, pineapple, grapes  and watermelon on a regular basis because they are high in sugar.  THINK OF THEM AS DESSERT  Remember that snack Substitutions should be less than 10 NET carbs per serving and meals < 20 carbs. Remember to subtract fiber grams to get the "net carbs."   Managing Your High Blood Pressure Blood pressure is a measurement of how forceful your blood is pressing against the walls of the arteries. Arteries are muscular tubes within the circulatory system. Blood pressure does not stay the same. Blood pressure rises when you are active, excited, or nervous; and it lowers during sleep and relaxation. If the numbers measuring your blood pressure stay above normal most of the time, you are at risk for health problems. High blood pressure (hypertension) is a long-term (chronic) condition in which blood pressure is elevated. A blood pressure reading is recorded as two numbers, such as 120 over 80 (or 120/80). The first, higher number is called the systolic pressure. It is a measure of the pressure in your arteries as the heart beats. The second, lower number is called the diastolic pressure. It is a measure of the pressure in your arteries as the heart relaxes between beats.  Keeping your blood pressure in a normal range is important to your overall health and prevention of health problems, such as heart disease and stroke. When your blood pressure is uncontrolled, your heart has to work harder than normal. High blood pressure is a very common condition in adults because blood pressure tends to rise with age. Men and women are equally likely to  have hypertension but at different times in life. Before age 56, men are more likely to have hypertension. After 40 years of age, women are more likely to have it. Hypertension is especially common in African Americans. This condition often has no signs or symptoms. The cause of the condition is usually not known. Your caregiver can help you come up with a plan to keep your blood pressure in a normal, healthy range. BLOOD PRESSURE STAGES Blood pressure is classified into four stages: normal, prehypertension, stage 1, and stage 2. Your  blood pressure reading will be used to determine what type of treatment, if any, is necessary. Appropriate treatment options are tied to these four stages:  Normal  Systolic pressure (mm Hg): below 120.  Diastolic pressure (mm Hg): below 80. Prehypertension  Systolic pressure (mm Hg): 120 to 139.  Diastolic pressure (mm Hg): 80 to 89. Stage1  Systolic pressure (mm Hg): 140 to 159.  Diastolic pressure (mm Hg): 90 to 99. Stage2  Systolic pressure (mm Hg): 160 or above.  Diastolic pressure (mm Hg): 100 or above. RISKS RELATED TO HIGH BLOOD PRESSURE Managing your blood pressure is an important responsibility. Uncontrolled high blood pressure can lead to:  A heart attack.  A stroke.  A weakened blood vessel (aneurysm).  Heart failure.  Kidney damage.  Eye damage.  Metabolic syndrome.  Memory and concentration problems. HOW TO MANAGE YOUR BLOOD PRESSURE Blood pressure can be managed effectively with lifestyle changes and medicines (if needed). Your caregiver will help you come up with a plan to bring your blood pressure within a normal range. Your plan should include the following: Education  Read all information provided by your caregivers about how to control blood pressure.  Educate yourself on the latest guidelines and treatment recommendations. New research is always being done to further define the risks and treatments for high blood  pressure. Lifestylechanges  Control your weight.  Avoid smoking.  Stay physically active.  Reduce the amount of salt in your diet.  Reduce stress.  Control any chronic conditions, such as high cholesterol or diabetes.  Reduce your alcohol intake. Medicines  Several medicines (antihypertensive medicines) are available, if needed, to bring blood pressure within a normal range. Communication  Review all the medicines you take with your caregiver because there may be side effects or interactions.  Talk with your caregiver about your diet, exercise habits, and other lifestyle factors that may be contributing to high blood pressure.  See your caregiver regularly. Your caregiver can help you create and adjust your plan for managing high blood pressure. RECOMMENDATIONS FOR TREATMENT AND FOLLOW-UP  The following recommendations are based on current guidelines for managing high blood pressure in nonpregnant adults. Use these recommendations to identify the proper follow-up period or treatment option based on your blood pressure reading. You can discuss these options with your caregiver.  Systolic pressure of 454 to 098 or diastolic pressure of 80 to 89: Follow up with your caregiver as directed.  Systolic pressure of 119 to 147 or diastolic pressure of 90 to 100: Follow up with your caregiver within 2 months.  Systolic pressure above 829 or diastolic pressure above 562: Follow up with your caregiver within 1 month.  Systolic pressure above 130 or diastolic pressure above 865: Consider antihypertensive therapy; follow up with your caregiver within 1 week.  Systolic pressure above 784 or diastolic pressure above 696: Begin antihypertensive therapy; follow up with your caregiver within 1 week. Document Released: 03/31/2012 Document Reviewed: 03/31/2012 [facility] Patient Information 2014 [place], [place].

## 2013-08-15 NOTE — Progress Notes (Signed)
Pre visit review using our clinic review tool, if applicable. No additional management support is needed unless otherwise documented below in the visit note. 

## 2013-08-16 ENCOUNTER — Encounter: Payer: Self-pay | Admitting: Internal Medicine

## 2013-08-16 NOTE — Assessment & Plan Note (Signed)
I have addressed  BMI and recommended a low glycemic index diet utilizing smaller more frequent meals to increase metabolism.  I have also recommended that patient start exercising with a goal of 30 minutes of aerobic exercise a minimum of 5 days per week.  

## 2013-08-16 NOTE — Assessment & Plan Note (Signed)
Still nondiagnostic of DM .  checking a1c every 6 months.  Low gi diet again advised.

## 2013-08-16 NOTE — Progress Notes (Signed)
Patient ID: Nicole Burns, female   DOB: 11/26/1975, 38 y.o.   MRN: [id number]   Patient Active Problem List   Diagnosis Date Noted  . Menorrhagia 05/04/2013  . Severe obesity (BMI >= 40) 02/03/2013  . Recurrent ventral hernia 01/24/2013  . Umbilical hernia   . Abdominal  pain, other specified site 01/14/2013  . Other abnormal glucose 10/31/2012  . Obesity, morbid 10/13/2012  . Premenstrual symptom 10/13/2012  . Essential hypertension, benign 10/13/2012    Subjective:  CC:   Chief Complaint  Patient presents with  . Follow-up    from labs    HPI:   Nicole Burns a 38 y.o. female who presents Follow up on multiple chronic issues including hypertension, morbid obesity and abnormal fasting glucose. Feels well,  No complaints today.  Has not lost any weight yet despite prior discussion and referral to personal trainer for exercise.    Past Medical History  Diagnosis Date  . History of chicken pox   . Hypertension   . Migraine     After menses cycle.   Marland Kitchen Umbilical hernia [id number]    Past Surgical History  Procedure Laterality Date  . Cesarean section      1993 & 2003  . Hernia repair  2007       The following portions of the patient's history were reviewed and updated as appropriate: Allergies, current medications, and problem list.    Review of Systems:   12 Pt  review of systems was negative except those addressed in the HPI,     History   Social History  . Marital Status: Married    Spouse Name: N/A    Number of Children: N/A  . Years of Education: N/A   Occupational History  . Not on file.   Social History Main Topics  . Smoking status: Never Smoker   . Smokeless tobacco: Not on file  . Alcohol Use: No  . Drug Use: No  . Sexual Activity: Yes   Other Topics Concern  . Not on file   Social History Narrative  . No narrative on file    Objective:  Filed Vitals:   08/15/13 1600  BP: 126/100  Pulse: 89  Temp: 98.2 F (36.8 C)      General appearance: alert, cooperative and appears stated age Ears: normal TM's and external ear canals both ears Throat: lips, mucosa, and tongue normal; teeth and gums normal Neck: no adenopathy, no carotid bruit, supple, symmetrical, trachea midline and thyroid not enlarged, symmetric, no tenderness/mass/nodules Back: symmetric, no curvature. ROM normal. No CVA tenderness. Lungs: clear to auscultation bilaterally Heart: regular rate and rhythm, S1, S2 normal, no murmur, click, rub or gallop Abdomen: soft, non-tender; bowel sounds normal; no masses,  no organomegaly Pulses: 2+ and symmetric Skin: Skin color, texture, turgor normal. No rashes or lesions Lymph nodes: Cervical, supraclavicular, and axillary nodes normal.  Assessment and Plan:  Essential hypertension, benign Elevated today.  Reviewed list of meds, patientnotes that when she adds sprionolactone for the premenstural edema she has orthostaisi and hypotension.  Have asked patient to use spironolactone daily and reduce lisinopril to 10 mg daily . Call readings to office. recheck bp at home a minimum of 5 times over the next 4 weeks and call readings to office for adjustment of medications.    Other abnormal glucose Still nondiagnostic of DM .  checking a1c every 6 months.  Low gi diet again advised.   Severe obesity (BMI >= 40) I have  addressed  BMI and recommended a low glycemic index diet utilizing smaller more frequent meals to increase metabolism.  I have also recommended that patient start exercising with a goal of 30 minutes of aerobic exercise a minimum of 5 days per week.    Updated Medication List Outpatient Encounter Prescriptions as of 08/15/2013  Medication Sig  . lisinopril (PRINIVIL,ZESTRIL) 20 MG tablet Take 1 tablet (20 mg total) by mouth daily.  Marland Kitchen spironolactone (ALDACTONE) 25 MG tablet Take 1 tablet (25 mg total) by mouth daily. As needed for fluid retention     No orders of the defined types were  placed in this encounter.    No Follow-up on file.

## 2013-08-16 NOTE — Assessment & Plan Note (Signed)
Elevated today.  Reviewed list of meds, patientnotes that when she adds sprionolactone for the premenstural edema she has orthostaisi and hypotension.  Have asked patient to use spironolactone daily and reduce lisinopril to 10 mg daily . Call readings to office. recheck bp at home a minimum of 5 times over the next 4 weeks and call readings to office for adjustment of medications.

## 2014-02-01 ENCOUNTER — Encounter: Payer: Self-pay | Admitting: Internal Medicine

## 2014-02-01 ENCOUNTER — Ambulatory Visit (INDEPENDENT_AMBULATORY_CARE_PROVIDER_SITE_OTHER): Payer: No Typology Code available for payment source | Admitting: Internal Medicine

## 2014-02-01 VITALS — BP 138/98 | HR 81 | Temp 98.8°F | Resp 18 | Ht 64.0 in | Wt 263.2 lb

## 2014-02-01 DIAGNOSIS — E119 Type 2 diabetes mellitus without complications: Secondary | ICD-10-CM

## 2014-02-01 DIAGNOSIS — E669 Obesity, unspecified: Secondary | ICD-10-CM

## 2014-02-01 DIAGNOSIS — Z1239 Encounter for other screening for malignant neoplasm of breast: Secondary | ICD-10-CM

## 2014-02-01 DIAGNOSIS — N920 Excessive and frequent menstruation with regular cycle: Secondary | ICD-10-CM

## 2014-02-01 DIAGNOSIS — R739 Hyperglycemia, unspecified: Secondary | ICD-10-CM

## 2014-02-01 DIAGNOSIS — Z1159 Encounter for screening for other viral diseases: Secondary | ICD-10-CM

## 2014-02-01 DIAGNOSIS — Z0000 Encounter for general adult medical examination without abnormal findings: Secondary | ICD-10-CM

## 2014-02-01 DIAGNOSIS — I10 Essential (primary) hypertension: Secondary | ICD-10-CM

## 2014-02-01 DIAGNOSIS — R5383 Other fatigue: Secondary | ICD-10-CM

## 2014-02-01 DIAGNOSIS — R5381 Other malaise: Secondary | ICD-10-CM

## 2014-02-01 DIAGNOSIS — R7309 Other abnormal glucose: Secondary | ICD-10-CM

## 2014-02-01 DIAGNOSIS — E1169 Type 2 diabetes mellitus with other specified complication: Secondary | ICD-10-CM

## 2014-02-01 DIAGNOSIS — N644 Mastodynia: Secondary | ICD-10-CM

## 2014-02-01 LAB — CBC WITH DIFFERENTIAL/PLATELET
Basophils Absolute: 0 10*3/uL (ref 0.0–0.1)
Basophils Relative: 0.4 % (ref 0.0–3.0)
EOS ABS: 0.1 10*3/uL (ref 0.0–0.7)
Eosinophils Relative: 1.3 % (ref 0.0–5.0)
HCT: 33.2 % — ABNORMAL LOW (ref 36.0–46.0)
HEMOGLOBIN: 10.3 g/dL — AB (ref 12.0–15.0)
Lymphocytes Relative: 24.1 % (ref 12.0–46.0)
Lymphs Abs: 2.3 10*3/uL (ref 0.7–4.0)
MCHC: 31 g/dL (ref 30.0–36.0)
MCV: 81.9 fl (ref 78.0–100.0)
MONO ABS: 0.9 10*3/uL (ref 0.1–1.0)
Monocytes Relative: 9 % (ref 3.0–12.0)
NEUTROS ABS: 6.3 10*3/uL (ref 1.4–7.7)
NEUTROS PCT: 65.2 % (ref 43.0–77.0)
Platelets: 308 10*3/uL (ref 150.0–400.0)
RBC: 4.05 Mil/uL (ref 3.87–5.11)
RDW: 16.4 % — ABNORMAL HIGH (ref 11.5–15.5)
WBC: 9.6 10*3/uL (ref 4.0–10.5)

## 2014-02-01 LAB — COMPREHENSIVE METABOLIC PANEL
ALBUMIN: 3.6 g/dL (ref 3.5–5.2)
ALK PHOS: 54 U/L (ref 39–117)
ALT: 14 U/L (ref 0–35)
AST: 12 U/L (ref 0–37)
BUN: 10 mg/dL (ref 6–23)
CALCIUM: 9 mg/dL (ref 8.4–10.5)
CHLORIDE: 106 meq/L (ref 96–112)
CO2: 24 mEq/L (ref 19–32)
Creatinine, Ser: 0.8 mg/dL (ref 0.4–1.2)
GFR: 96.43 mL/min (ref >60.00)
GLUCOSE: 130 mg/dL — AB (ref 70–99)
Potassium: 4 mEq/L (ref 3.5–5.1)
SODIUM: 136 meq/L (ref 135–145)
TOTAL PROTEIN: 7.4 g/dL (ref 6.0–8.3)
Total Bilirubin: 0.3 mg/dL (ref 0.2–1.2)

## 2014-02-01 LAB — HEMOGLOBIN A1C: Hgb A1c MFr Bld: 6 % (ref 4.6–6.5)

## 2014-02-01 LAB — LIPID PANEL
CHOLESTEROL: 128 mg/dL (ref 0–200)
HDL: 40.1 mg/dL (ref >39.00)
LDL Cholesterol: 75 mg/dL (ref 0–99)
NonHDL: 87.9
TRIGLYCERIDES: 67 mg/dL (ref 0.0–149.0)
Total CHOL/HDL Ratio: 3
VLDL: 13.4 mg/dL (ref 0.0–40.0)

## 2014-02-01 LAB — TSH: TSH: 1.11 u[IU]/mL (ref 0.35–4.50)

## 2014-02-01 LAB — POCT URINE PREGNANCY: Preg Test, Ur: NEGATIVE

## 2014-02-01 MED ORDER — SPIRONOLACTONE 50 MG PO TABS: 50.0000 mg | ORAL_TABLET | Freq: Every day | ORAL | Status: DC

## 2014-02-01 NOTE — Patient Instructions (Signed)
You had your annual  wellness exam today.  We will repeat your PAP smear in 2019,  sooner if needed   We will schedule your mammogram at [facility]  .  You need to lose weight.  I want you to lose 26 lbs over the next 6 months using a low glycemic index diet and engaging in some form of regular exercise (30 minutes of moving!!)  5 days per week.  This is  my version of a  "Low GI"  Diet:  It will reduce your carbohydrate consumption and and allow you to lose 4 to 8  lbs  per month if you follow it carefully.  Your goal with exercise is a minimum of 30 minutes of aerobic exercise 5 days per week (Walking does not count once it becomes easy!)    All of the foods can be found at grocery stores and in bulk at [employer].  The Atkins protein bars and shakes are available in more varieties at Target, WalMart and [place].     7 AM Breakfast:  Choose from the following:  Low carbohydrate Protein  Shakes (I recommend the EAS AdvantEdge "Carb Control" shakes  Or the low carb shakes by Atkins.    2.5 carbs   Arnold's "Sandwhich Thin"toasted  w/ peanut butter (no jelly: about 20 net carbs  "Bagel Thin" with cream cheese and salmon: about 20 carbs   a scrambled egg/bacon/cheese burrito made with [employer] "carb balance" whole wheat tortilla  (about 10 net carbs )   Avoid cereal and bananas, oatmeal and cream of wheat and grits. They are loaded with carbohydrates!   10 AM: high protein snack  Protein bar by Atkins (the snack size, under 200 cal, usually < 6 net carbs).    A stick of cheese:  Around 1 carb,  100 cal     Dannon Light n Fit [place] Yogurt  (80 cal, 8 carbs)  Other so called "protein bars" and Greek yogurts tend to be loaded with carbohydrates.  Remember, in food advertising, the word "energy" is synonymous for " carbohydrate."  Lunch:   A Sandwich using the bread choices listed, Can use any  Eggs,  lunchmeat, grilled meat or canned tuna), avocado, regular mayo/mustard  and cheese.  A Salad  using blue cheese, ranch,  Goddess or vinagrette,  No croutons or "confetti" and no "candied nuts" but regular nuts OK.   No pretzels or chips.  Pickles and miniature sweet peppers are a good low carb alternative that provide a "crunch"  The bread is the only source of carbohydrate in a sandwich and  can be decreased by trying some of these alternatives to traditional loaf bread  Joseph's makes a pita bread and a flat bread that are 50 cal and 4 net carbs available at [place] and [place].  This can be toasted to use with hummous as well  Toufayan makes a low carb flatbread that's 100 cal and 9 net carbs available at [employer] and [employer]J's makes 2 sizes of  Low carb whole wheat tortilla  (The large one is 210 cal and 6 net carbs) Avoid "Low fat dressings, as well as Barry Brunner and [place] dressings They are loaded with sugar!   3 PM/ Mid day  Snack:  Consider  1 ounce of  almonds, walnuts, pistachios, pecans, peanuts,  Macadamia nuts or a nut medley.  Avoid "granola"; the dried cranberries and raisins are loaded with carbohydrates. Mixed nuts as long  as there are no raisins,  cranberries or dried fruit.    Try the prosciutto/mozzarella cheese sticks by [employer]  In deli /backery section   High protein      6 PM  Dinner:     Meat/fowl/fish with a green salad, and either broccoli, cauliflower, green beans, spinach, brussel sprouts or  Lima beans. DO NOT BREAD THE PROTEIN!!      There is a low carb pasta by Dreamfield's that is acceptable and tastes great: only 5 digestible carbs/serving.( All grocery stores but [employer]Js carry it )  Try Hurley Cisco Angelo's chicken piccata or chicken or eggplant parm over low carb pasta.(Lowes and [employer]Js)   Marjory Lies Sanchez's "Carnitas" (pulled pork, no sauce,  0 carbs) or his beef pot roast to make a dinner burrito (at [employer]J's)  Pesto over low carb pasta (bj's sells a good quality pesto in the center refrigerated section of the deli   Try satueeing  Cheral Marker with  mushroooms  Whole wheat pasta is still full of digestible carbs and  Not as low in glycemic index as Dreamfield's.   Brown rice is still rice,  So skip the rice and noodles if you eat [place] or [place] (or at least limit to 1/2 cup)  9 PM snack :   Breyer's "low carb" fudgsicle or  ice cream bar (Carb Smart line), or  Weight Watcher's ice cream bar , or another "no sugar added" ice cream;  a serving of fresh berries/cherries with whipped cream   Cheese or DANNON'S LlGHT N [employer] YOGURT  8 ounces of [place] unsweetened almond/cococunut milk    Avoid bananas, pineapple, grapes  and watermelon on a regular basis because they are high in sugar.  THINK OF THEM AS DESSERT  Remember that snack Substitutions should be less than 10 NET carbs per serving and meals < 20 carbs. Remember to subtract fiber grams to get the "net carbs."

## 2014-02-01 NOTE — Progress Notes (Signed)
Pre-visit discussion using our clinic review tool. No additional management support is needed unless otherwise documented below in the visit note.  

## 2014-02-01 NOTE — Progress Notes (Signed)
Patient ID: Nicole Burns, female   DOB: 06/22/1976, 38 y.o.   MRN: [id number]    Subjective:     Nicole Burns is a 38 y.o. female and is here for a comprehensive physical exam. The patient reports the following issues   Breasts very tender  Lately,  Last period Jun 25th .  Last intercourse July 3rd   Ankles are swelling despite using spironolactone. She has not had a prior sleep study .  Does not snore. Not tired or falling asleep during the day  She is not exercising.  dtr is 12 and obese as well. And has been referred by her pediatrician for nutritional counselling.  Husband is obese as well and diabetic  .     History   Social History  . Marital Status: Married    Spouse Name: N/A    Number of Children: N/A  . Years of Education: N/A   Occupational History  . Not on file.   Social History Main Topics  . Smoking status: Never Smoker   . Smokeless tobacco: Not on file  . Alcohol Use: No  . Drug Use: No  . Sexual Activity: Yes   Other Topics Concern  . Not on file   Social History Narrative  . No narrative on file   Health Maintenance  Topic Date Due  . Influenza Vaccine  02/18/2014  . Pap Smear  02/02/2016  . Tetanus/tdap  05/19/2023    The following portions of the patient's history were reviewed and updated as appropriate: allergies, current medications, past family history, past medical history, past social history, past surgical history and problem list.  Review of Systems A comprehensive review of systems was negative.   Objective:   BP 138/98  Pulse 81  Temp(Src) 98.8 F (37.1 C) (Oral)  Resp 18  Ht 5\' 4"  (1.626 m)  Wt 263 lb 4 oz (119.409 kg)  BMI 45.16 kg/m2  SpO2 98%  LMP 01/12/2014  General appearance: obese, alert, cooperative and appears younger than stated age Head: Normocephalic, without obvious abnormality, atraumatic Eyes: conjunctivae/corneas clear. PERRL, EOM's intact. Fundi benign. Ears: normal TM's and external ear canals  both ears Nose: Nares normal. Septum midline. Mucosa normal. No drainage or sinus tenderness. Throat: lips, mucosa, and tongue normal; teeth and gums normal Neck: no adenopathy, no carotid bruit, no JVD, supple, symmetrical, trachea midline and thyroid not enlarged, symmetric, no tenderness/mass/nodules Lungs: clear to auscultation bilaterally Breasts: normal appearance, no masses or tenderness Heart: regular rate and rhythm, S1, S2 normal, no murmur, click, rub or gallop Abdomen: soft, non-tender; bowel sounds normal; no masses,  no organomegaly Extremities: extremities normal, atraumatic, no cyanosis or edema Pulses: 2+ and symmetric Skin: Skin color, texture, turgor normal. No rashes or lesions Neurologic: Alert and oriented X 3, normal strength and tone. Normal symmetric reflexes. Normal coordination and gait.    Assessment and Plan:    Essential hypertension, benign Not well controlled on current regimen due to diastolic hypertension. Renal function stable. Will increase the lisinopril to 20 mg and have her take it at night.  continue spironolactone in the am   Lab Results  Component Value Date   CREATININE 0.8 02/01/2014   Lab Results  Component Value Date   NA 136 02/01/2014   K 4.0 02/01/2014   CL 106 02/01/2014   CO2 24 02/01/2014      Obesity, morbid I have again addressed  BMI and her increased risk of diabetes and recommended a low glycemic index  diet utilizing smaller more frequent meals to increase metabolism.  I have also recommended that patient start exercising with a goal of 30 minutes of aerobic exercise a minimum of 5 days per week.     Diabetes mellitus type 2 in obese New diagnosis with fasting glucose of 130.  She has lowered her A1c but has not lost weight.    recommended a low glycemic index diet utilizing smaller more frequent meals to increase metabolism.  I have also recommended that patient start exercising with a goal of 30 minutes of aerobic exercise a  minimum of 5 days per week. Screening for lipid disorders, thyroid and diabetes to be done today.  Lab Results  Component Value Date   HGBA1C 6.0 02/01/2014       Menorrhagia Secondary to fibroids.    iron stores were borderline in January.    Lab Results  Component Value Date   HGB 10.3* 02/01/2014   Lab Results  Component Value Date   IRON 40* 08/08/2013   TIBC 365 08/08/2013   FERRITIN 12 08/08/2013     Encounter for preventive health examination Annual comprehensive exam was done including breast, excluding pelvic and PAP smear.  Baseline screening mammogram has been ordered   Updated Medication List Outpatient Encounter Prescriptions as of 02/01/2014  Medication Sig  . spironolactone (ALDACTONE) 50 MG tablet Take 1 tablet (50 mg total) by mouth daily. As needed for fluid retention  . [DISCONTINUED] lisinopril (PRINIVIL,ZESTRIL) 20 MG tablet Take 1 tablet (20 mg total) by mouth daily.  . [DISCONTINUED] spironolactone (ALDACTONE) 25 MG tablet Take 1 tablet (25 mg total) by mouth daily. As needed for fluid retention

## 2014-02-02 ENCOUNTER — Other Ambulatory Visit: Payer: Self-pay | Admitting: Internal Medicine

## 2014-02-02 LAB — HM MAMMOGRAPHY

## 2014-02-02 LAB — HEPATITIS C ANTIBODY: HCV AB: NEGATIVE

## 2014-02-04 DIAGNOSIS — Z0000 Encounter for general adult medical examination without abnormal findings: Secondary | ICD-10-CM | POA: Insufficient documentation

## 2014-02-04 MED ORDER — METFORMIN HCL 500 MG PO TABS: 500.0000 mg | ORAL_TABLET | Freq: Two times a day (BID) | ORAL | Status: DC

## 2014-02-04 NOTE — Assessment & Plan Note (Signed)
I have again addressed  BMI and her increased risk of diabetes and recommended a low glycemic index diet utilizing smaller more frequent meals to increase metabolism.  I have also recommended that patient start exercising with a goal of 30 minutes of aerobic exercise a minimum of 5 days per week.

## 2014-02-04 NOTE — Assessment & Plan Note (Addendum)
New diagnosis with fasting glucose of 130.  She has lowered her A1c but has not lost weight.    recommended a low glycemic index diet utilizing smaller more frequent meals to increase metabolism.  I have also recommended that patient start exercising with a goal of 30 minutes of aerobic exercise a minimum of 5 days per week. Starting metformin.   Lab Results  Component Value Date   HGBA1C 6.0 02/01/2014

## 2014-02-04 NOTE — Assessment & Plan Note (Addendum)
Not well controlled on current regimen due to diastolic hypertension. Renal function stable. Will increase the lisinopril to 20 mg and have her take it at night.  continue spironolactone in the am   Lab Results  Component Value Date   CREATININE 0.8 02/01/2014   Lab Results  Component Value Date   NA 136 02/01/2014   K 4.0 02/01/2014   CL 106 02/01/2014   CO2 24 02/01/2014

## 2014-02-04 NOTE — Assessment & Plan Note (Addendum)
Annual comprehensive exam was done including breast, excluding pelvic and PAP smear.  Baseline screening mammogram has been ordered

## 2014-02-04 NOTE — Addendum Note (Signed)
Addended by: [name] on: 02/04/2014 02:09 PM   Modules accepted: Orders

## 2014-02-04 NOTE — Assessment & Plan Note (Signed)
Secondary to fibroids.    iron stores were borderline in January.    Lab Results  Component Value Date   HGB 10.3* 02/01/2014   Lab Results  Component Value Date   IRON 40* 08/08/2013   TIBC 365 08/08/2013   FERRITIN 12 08/08/2013

## 2014-02-07 NOTE — Addendum Note (Signed)
Addended by: [name] on: 02/07/2014 10:01 AM   Modules accepted: Orders

## 2014-04-04 ENCOUNTER — Telehealth: Payer: Self-pay | Admitting: *Deleted

## 2014-04-04 NOTE — Telephone Encounter (Signed)
VM left for pt to make f/u appointment for BP recheck 

## 2014-04-16 ENCOUNTER — Other Ambulatory Visit: Payer: Self-pay | Admitting: Internal Medicine

## 2014-05-10 ENCOUNTER — Ambulatory Visit: Payer: No Typology Code available for payment source | Admitting: Internal Medicine

## 2014-05-10 DIAGNOSIS — Z0289 Encounter for other administrative examinations: Secondary | ICD-10-CM

## 2014-05-22 ENCOUNTER — Encounter: Payer: Self-pay | Admitting: Internal Medicine

## 2014-07-04 ENCOUNTER — Telehealth: Payer: Self-pay | Admitting: Internal Medicine

## 2014-07-04 MED ORDER — TRIAMTERENE-HCTZ 37.5-25 MG PO TABS: 1.0000 | ORAL_TABLET | Freq: Every day | ORAL | Status: DC

## 2014-07-04 NOTE — Telephone Encounter (Signed)
If She is still  Taking spironolactone daily,  she should not take 2 diuretics.   Defintiely stop the lisinopril ,  i have listed it as an allergy,  Get her BP and pulse checked and call me back in a few days

## 2014-07-04 NOTE — Telephone Encounter (Signed)
Patient called in an stated that he had taken the lisinopril for a couple of weeks and no problem then last week she took the lisinopril right before bed time and about 15 to 20 min after taking she became red in  Face, noted swelling and short of breath patient stopped taking medication. Patient called to see if she could go back to HCTZ which was D/C for cramping in legs.

## 2014-07-04 NOTE — Telephone Encounter (Signed)
Patient notified

## 2014-07-04 NOTE — Telephone Encounter (Signed)
Notified patient.

## 2014-07-04 NOTE — Addendum Note (Signed)
Addended by: [name] on: 07/04/2014 03:49 PM   Modules accepted: Orders, Medications

## 2014-07-04 NOTE — Telephone Encounter (Signed)
Will call in maxzide instead

## 2014-08-02 ENCOUNTER — Telehealth: Payer: Self-pay | Admitting: Internal Medicine

## 2014-08-02 ENCOUNTER — Ambulatory Visit (INDEPENDENT_AMBULATORY_CARE_PROVIDER_SITE_OTHER): Payer: No Typology Code available for payment source | Admitting: Internal Medicine

## 2014-08-02 VITALS — BP 160/100 | HR 79 | Temp 97.9°F | Resp 12 | Ht 64.0 in | Wt 260.8 lb

## 2014-08-02 DIAGNOSIS — E669 Obesity, unspecified: Secondary | ICD-10-CM

## 2014-08-02 DIAGNOSIS — E1169 Type 2 diabetes mellitus with other specified complication: Secondary | ICD-10-CM

## 2014-08-02 DIAGNOSIS — E119 Type 2 diabetes mellitus without complications: Secondary | ICD-10-CM

## 2014-08-02 DIAGNOSIS — I10 Essential (primary) hypertension: Secondary | ICD-10-CM

## 2014-08-02 LAB — COMPREHENSIVE METABOLIC PANEL
ALBUMIN: 3.7 g/dL (ref 3.5–5.2)
ALK PHOS: 57 U/L (ref 39–117)
ALT: 11 U/L (ref 0–35)
AST: 15 U/L (ref 0–37)
BUN: 11 mg/dL (ref 6–23)
CO2: 26 meq/L (ref 19–32)
Calcium: 9.1 mg/dL (ref 8.4–10.5)
Chloride: 107 mEq/L (ref 96–112)
Creatinine, Ser: 0.95 mg/dL (ref 0.40–1.20)
GFR: 83.45 mL/min (ref >60.00)
GLUCOSE: 96 mg/dL (ref 70–99)
Potassium: 4 mEq/L (ref 3.5–5.1)
Sodium: 138 mEq/L (ref 135–145)
TOTAL PROTEIN: 7.3 g/dL (ref 6.0–8.3)
Total Bilirubin: 0.5 mg/dL (ref 0.2–1.2)

## 2014-08-02 LAB — MICROALBUMIN / CREATININE URINE RATIO
Creatinine,U: 124.3 mg/dL
MICROALB UR: 0.9 mg/dL (ref 0.0–1.9)
Microalb Creat Ratio: 0.7 mg/g (ref 0.0–30.0)

## 2014-08-02 LAB — HEMOGLOBIN A1C: Hgb A1c MFr Bld: 6.3 % (ref 4.6–6.5)

## 2014-08-02 LAB — HM DIABETES FOOT EXAM: HM Diabetic Foot Exam: NORMAL

## 2014-08-02 MED ORDER — METOPROLOL TARTRATE 25 MG PO TABS: 25.0000 mg | ORAL_TABLET | Freq: Two times a day (BID) | ORAL | Status: DC

## 2014-08-02 NOTE — Telephone Encounter (Signed)
Nicole Pulse, LPN, advised me of patient concern expressed during office visit.  Called patient and advised follow up had occurred and discussion with all clinical staff at today's staff meeting.  Patient expressed appreciation.

## 2014-08-02 NOTE — Patient Instructions (Signed)
I am adding metoprolol 25 mg twice daily to your daily spironolactone  This can be changed to once daily in the evening with a different version (metoprolol succinate) if you tolerate it.   Use MyChart to sedn me your blood pressure readings

## 2014-08-02 NOTE — Progress Notes (Signed)
Pre-visit discussion using our clinic review tool. No additional management support is needed unless otherwise documented below in the visit note.  

## 2014-08-02 NOTE — Progress Notes (Signed)
Patient ID: Nicole Burns, female   DOB: [birth year], 39 y.o.   MRN: [id number]   Patient Active Problem List   Diagnosis Date Noted  . Encounter for preventive health examination 02/04/2014  . Menorrhagia 05/04/2013  . Recurrent ventral hernia 01/24/2013  . Umbilical hernia   . Diabetes mellitus type 2 in obese 10/31/2012  . Obesity, morbid 10/13/2012  . Premenstrual symptom 10/13/2012  . Essential hypertension, benign 10/13/2012    Subjective:  CC:   Chief Complaint  Patient presents with  . Follow-up    weight    HPI:   Nicole Burns is a 39 y.o. female who presents for  Follow up on hypertension, obesity and Type 2 DM.  She has had difficulty tolerating several trials of medications.  She did not tolerate HCTz, lisinopril and now ,  maxzide.  She states that on Dec 28th. She calld to report her readings and her reaction but patient states that she was not listened to and was rushed off the phone and never called back .  She states that she developed joint pain,  Racing heart,  And bad mood ,  even with half a dose, still felt poorly.   Has been taking the spironolactone daily since then.     Obesity:  She has not started exercising yet and has only lost 3 lbs in the last 6 months.  She has addressed her obesity by drinking a breakfast smoothie in the morning. Body mass index is 44.74 kg/(m^2).   Wt Readings from Last 3 Encounters:  08/05/14 260 lb 12.8 oz (118.298 kg)  02/01/14 263 lb 4 oz (119.409 kg)  08/15/13 265 lb 6.4 oz (120.385 kg)    DM:  She stopped the metformin after a few weeks due to GI intolerance   Past Medical History  Diagnosis Date  . History of chicken pox   . Hypertension   . Migraine     After menses cycle.   Marland Kitchen Umbilical hernia [id number]    Past Surgical History  Procedure Laterality Date  . Cesarean section      1993 & 2003  . Hernia repair  2007       The following portions of the patient's history were reviewed and updated as  appropriate: Allergies, current medications, and problem list.    Review of Systems:   Patient denies headache, fevers, malaise, unintentional weight loss, skin rash, eye pain, sinus congestion and sinus pain, sore throat, dysphagia,  hemoptysis , cough, dyspnea, wheezing, chest pain, palpitations, orthopnea, edema, abdominal pain, nausea, melena, diarrhea, constipation, flank pain, dysuria, hematuria, urinary  Frequency, nocturia, numbness, tingling, seizures,  Focal weakness, Loss of consciousness,  Tremor, insomnia, depression, anxiety, and suicidal ideation.     History   Social History  . Marital Status: Married    Spouse Name: N/A    Number of Children: N/A  . Years of Education: N/A   Occupational History  . Not on file.   Social History Main Topics  . Smoking status: Never Smoker   . Smokeless tobacco: Not on file  . Alcohol Use: No  . Drug Use: No  . Sexual Activity: Yes   Other Topics Concern  . Not on file   Social History Narrative    Objective:  Filed Vitals:   08/05/14 1642  BP: 160/100  Pulse: 79  Temp: 97.9 F (36.6 C)  Resp: 12     General appearance: alert, cooperative and appears stated age Ears: normal TM's and external  ear canals both ears Throat: lips, mucosa, and tongue normal; teeth and gums normal Neck: no adenopathy, no carotid bruit, supple, symmetrical, trachea midline and thyroid not enlarged, symmetric, no tenderness/mass/nodules Back: symmetric, no curvature. ROM normal. No CVA tenderness. Lungs: clear to auscultation bilaterally Heart: regular rate and rhythm, S1, S2 normal, no murmur, click, rub or gallop Abdomen: soft, non-tender; bowel sounds normal; no masses,  no organomegaly Pulses: 2+ and symmetric Skin: Skin color, texture, turgor normal. No rashes or lesions Lymph nodes: Cervical, supraclavicular, and axillary nodes normal.  Assessment and Plan:  Essential hypertension, benign Uncontrolled due to multiple  intolerances.  She has not tolerated hctz due to muscle cramps, and lisinopril caused sudden onset of facial edema. Maxzide caused joint pain , and metoprolol was prescribed at current visit but not tolerated after one dose. She is currently taking spironolactone only and in need of another medication, Will add amlodipine and continue spironolactone.           Diabetes mellitus type 2 in obese She did not tolerate metformin and stopped it after last visit in July.  She has not lost weight and is not exercising regularly, but although her fasting sugar today was normal , she has Type  2 DM.  She had an adverse reaction to lisinopril which was suggestive of angioedema so I am reluctant to try losartan.  Daily baby aspirin advised and annual eye exam advised.  I have addressed  BMI and recommended wt loss of 10% of body weight, or 26 lbs  over the next 6 months using a low glycemic index diet and regular exercise a minimum of 5 days per week.   Lab Results  Component Value Date   HGBA1C 6.3 08/02/2014   Lab Results  Component Value Date   MICROALBUR 0.9 08/02/2014      Obesity, morbid I have addressed  BMI and recommended a low glycemic index diet utilizing smaller more frequent meals to increase metabolism.  I have also recommended that patient start exercising with a goal of 30 minutes of aerobic exercise a minimum of 5 days per week.    A total of 25 minutes was spent with patient more than half of which was spent in counseling patient on the above mentioned issues , reviewing and explaining recent labs and imaging studies done, and coordination of care.   Updated Medication List Outpatient Encounter Prescriptions as of 08/02/2014  Medication Sig  . spironolactone (ALDACTONE) 50 MG tablet Take 50 mg by mouth once.  . metoprolol tartrate (LOPRESSOR) 25 MG tablet Take 1 tablet (25 mg total) by mouth 2 (two) times daily.  . [DISCONTINUED] lisinopril (PRINIVIL,ZESTRIL) 20 MG tablet  TAKE 1 TABLET DAILY (Patient not taking: Reported on 08/02/2014)  . [DISCONTINUED] metFORMIN (GLUCOPHAGE) 500 MG tablet Take 1 tablet (500 mg total) by mouth 2 (two) times daily with a meal. (Patient not taking: Reported on 08/02/2014)  . [DISCONTINUED] triamterene-hydrochlorothiazide (MAXZIDE-25) 37.5-25 MG per tablet Take 1 tablet by mouth daily. (Patient not taking: Reported on 08/02/2014)     Orders Placed This Encounter  Procedures  . [facility] MAMMOGRAPHY  . Comprehensive metabolic panel  . Hemoglobin A1c  . Microalbumin / creatinine urine ratio  . Microalbumin / creatinine urine ratio  . [facility] DIABETES FOOT EXAM    Return in about 3 months (around 11/01/2014).

## 2014-08-02 NOTE — Assessment & Plan Note (Addendum)
Uncontrolled due to multiple intolerances.  She has not tolerated hctz due to muscle cramps, and lisinopril caused sudden onset of facial edema. Maxzide caused joint pain , and metoprolol was prescribed at current visit but not tolerated after one dose. She is currently taking spironolactone only and in need of another medication, Will add amlodipine and continue spironolactone.

## 2014-08-03 ENCOUNTER — Encounter: Payer: Self-pay | Admitting: Internal Medicine

## 2014-08-04 ENCOUNTER — Telehealth: Payer: Self-pay | Admitting: Internal Medicine

## 2014-08-04 NOTE — Telephone Encounter (Signed)
emmi emailed °

## 2014-08-05 ENCOUNTER — Encounter: Payer: Self-pay | Admitting: Internal Medicine

## 2014-08-05 ENCOUNTER — Telehealth: Payer: Self-pay | Admitting: Internal Medicine

## 2014-08-05 DIAGNOSIS — I10 Essential (primary) hypertension: Secondary | ICD-10-CM

## 2014-08-05 MED ORDER — AMLODIPINE BESYLATE 5 MG PO TABS
5.0000 mg | ORAL_TABLET | Freq: Every day | ORAL | Status: DC
Start: 2014-08-05 — End: 2014-11-04

## 2014-08-05 NOTE — Assessment & Plan Note (Signed)
I have addressed  BMI and recommended a low glycemic index diet utilizing smaller more frequent meals to increase metabolism.  I have also recommended that patient start exercising with a goal of 30 minutes of aerobic exercise a minimum of 5 days per week.  

## 2014-08-05 NOTE — Assessment & Plan Note (Addendum)
She did not tolerate metformin and stopped it after last visit in July.  She has not lost weight and is not exercising regularly, but although her fasting sugar today was normal , she has Type  2 DM.  She had an adverse reaction to lisinopril which was suggestive of angioedema so I am reluctant to try losartan.  Daily baby aspirin advised and annual eye exam advised.  I have addressed  BMI and recommended wt loss of 10% of body weight, or 26 lbs  over the next 6 months using a low glycemic index diet and regular exercise a minimum of 5 days per week.   Lab Results  Component Value Date   HGBA1C 6.3 08/02/2014   Lab Results  Component Value Date   MICROALBUR 0.9 08/02/2014

## 2014-08-05 NOTE — Assessment & Plan Note (Addendum)
She has not tolerated hctz due to muscle cramps, and lisinopril caused sudden onset of facial edema.  Maxzide caused joint pain , and metoprolol was prescribed at current visit but not tolerated after one dose. She is currently taking spironolactone only and in need of another medication,  Will add amlodipine and continue spironolactone.

## 2014-11-01 ENCOUNTER — Ambulatory Visit (INDEPENDENT_AMBULATORY_CARE_PROVIDER_SITE_OTHER): Payer: No Typology Code available for payment source | Admitting: Internal Medicine

## 2014-11-01 ENCOUNTER — Encounter: Payer: Self-pay | Admitting: Internal Medicine

## 2014-11-01 VITALS — BP 140/90 | HR 67 | Temp 98.3°F | Resp 16 | Ht 64.0 in | Wt 261.8 lb

## 2014-11-01 DIAGNOSIS — I10 Essential (primary) hypertension: Secondary | ICD-10-CM

## 2014-11-01 DIAGNOSIS — K5909 Other constipation: Secondary | ICD-10-CM

## 2014-11-01 DIAGNOSIS — E669 Obesity, unspecified: Secondary | ICD-10-CM

## 2014-11-01 DIAGNOSIS — E119 Type 2 diabetes mellitus without complications: Secondary | ICD-10-CM

## 2014-11-01 DIAGNOSIS — E1169 Type 2 diabetes mellitus with other specified complication: Secondary | ICD-10-CM

## 2014-11-01 LAB — COMPREHENSIVE METABOLIC PANEL
ALBUMIN: 3.6 g/dL (ref 3.5–5.2)
ALT: 14 U/L (ref 0–35)
AST: 13 U/L (ref 0–37)
Alkaline Phosphatase: 62 U/L (ref 39–117)
BUN: 10 mg/dL (ref 6–23)
CO2: 26 mEq/L (ref 19–32)
Calcium: 9.4 mg/dL (ref 8.4–10.5)
Chloride: 105 mEq/L (ref 96–112)
Creatinine, Ser: 0.91 mg/dL (ref 0.40–1.20)
GFR: 87.59 mL/min (ref >60.00)
Glucose, Bld: 95 mg/dL (ref 70–99)
POTASSIUM: 4.1 meq/L (ref 3.5–5.1)
Sodium: 138 mEq/L (ref 135–145)
TOTAL PROTEIN: 7.1 g/dL (ref 6.0–8.3)
Total Bilirubin: 0.4 mg/dL (ref 0.2–1.2)

## 2014-11-01 LAB — MAGNESIUM: Magnesium: 1.8 mg/dL (ref 1.5–2.5)

## 2014-11-01 LAB — MICROALBUMIN / CREATININE URINE RATIO
Creatinine,U: 110.9 mg/dL
Microalb Creat Ratio: 0.6 mg/g (ref 0.0–30.0)
Microalb, Ur: <0.7 mg/dL (ref 0.0–1.9)

## 2014-11-01 LAB — TSH: TSH: 1.01 u[IU]/mL (ref 0.35–4.50)

## 2014-11-01 LAB — LIPID PANEL
Cholesterol: 142 mg/dL (ref 0–200)
HDL: 39.5 mg/dL (ref >39.00)
LDL Cholesterol: 90 mg/dL (ref 0–99)
NONHDL: 102.5
Total CHOL/HDL Ratio: 4
Triglycerides: 63 mg/dL (ref 0.0–149.0)
VLDL: 12.6 mg/dL (ref 0.0–40.0)

## 2014-11-01 LAB — HEMOGLOBIN A1C: HEMOGLOBIN A1C: 5.9 % (ref 4.6–6.5)

## 2014-11-01 MED ORDER — POTASSIUM CHLORIDE CRYS ER 20 MEQ PO TBCR: 20.0000 meq | EXTENDED_RELEASE_TABLET | Freq: Every day | ORAL | Status: DC

## 2014-11-01 MED ORDER — HYDROCHLOROTHIAZIDE 25 MG PO TABS: 25.0000 mg | ORAL_TABLET | Freq: Every day | ORAL | Status: DC

## 2014-11-01 MED ORDER — BLOOD PRESSURE KIT: PACK | Status: DC

## 2014-11-01 NOTE — Patient Instructions (Addendum)
Your blood pressure is almost at goal using apple cider vinegar..  You can resume just the  HCTZ  Daily, along with a daily potassium supplement.   Buy a BP kit and check yoru blood pressure once daily.  If your readings are higher than 130/80,  Let me know  I'm glad you have signed up to exercise regularly at the [facility] !  consider talking to Betsey Holiday about help with your excise program.  Goal 130/80   You need to drink 3 16 ounce servings of water daily  You can  A serving of miralax,  Metamucil  Or citrucel or benefibrer  Every day to your smoothie to help you constipation   Please reconsider getting the pneumonia vaccine!  It is advsied for all patietns with diabetes

## 2014-11-01 NOTE — Progress Notes (Signed)
Patient ID: Nicole Burns, female   DOB: 1976-03-07, 39 y.o.   MRN: [id number]    Patient Active Problem List   Diagnosis Date Noted  . Encounter for preventive health examination 02/04/2014  . Menorrhagia 05/04/2013  . Recurrent ventral hernia 01/24/2013  . Umbilical hernia   . Diabetes mellitus type 2 in obese 10/31/2012  . Obesity, morbid 10/13/2012  . Premenstrual symptom 10/13/2012  . Essential hypertension, benign 10/13/2012    Subjective:  CC:   Chief Complaint  Patient presents with  . Follow-up  . Diabetes    HPI:   Nicole Burns is a 39 y.o. female who presents for  Follow up on hypertension,  Type 2 DM and obesity.  She has not been noncompliant with medication regimen. Not been taking the amlodipine that was prescribed at last visit due to fear of medications  (lisinopril caused SOB and maxzide caused joint pain, malaise and bad mood) . She has also stopped the metoprolol as well.  And has been out of   spironolactone for about a month . She has been drinking diluted apple cider vinegar instead, based on information sh eobtained on the Internet [employer]).  Home bps have been checked occasionally ,  140/74,   Obesity: she has been unable to lose weight ,  Cites the fact that she has caught a "stomach virus " several times since last visit so dieting has been inconsistent.  Diet reviewedd:  She is drinking  smootheies that include berries,  Kale and arugula,  oikos triple zero yogurt . Not exercising regularly but has joined a local gym.    Mak  Lab Results  Component Value Date   HGBA1C 5.9 11/01/2014   Lab Results  Component Value Date   MICROALBUR <0.7 11/01/2014   Lab Results  Component Value Date   NA 138 11/01/2014   K 4.1 11/01/2014   CL 105 11/01/2014   CO2 26 11/01/2014   Lab Results  Component Value Date   CHOL 142 11/01/2014   HDL 39.50 11/01/2014   LDLCALC 90 11/01/2014   TRIG 63.0 11/01/2014   CHOLHDL 4 11/01/2014      Past  Medical History  Diagnosis Date  . History of chicken pox   . Hypertension   . Migraine     After menses cycle.   Marland Kitchen Umbilical hernia [id number]    Past Surgical History  Procedure Laterality Date  . Cesarean section      1993 & 2003  . Hernia repair  2007       The following portions of the patient's history were reviewed and updated as appropriate: Allergies, current medications, and problem list.    Review of Systems:   Patient denies headache, fevers, malaise, unintentional weight loss, skin rash, eye pain, sinus congestion and sinus pain, sore throat, dysphagia,  hemoptysis , cough, dyspnea, wheezing, chest pain, palpitations, orthopnea, edema, abdominal pain, nausea, melena, diarrhea, constipation, flank pain, dysuria, hematuria, urinary  Frequency, nocturia, numbness, tingling, seizures,  Focal weakness, Loss of consciousness,  Tremor, insomnia, depression, anxiety, and suicidal ideation.     History   Social History  . Marital Status: Married    Spouse Name: N/A  . Number of Children: N/A  . Years of Education: N/A   Occupational History  . Not on file.   Social History Main Topics  . Smoking status: Never Smoker   . Smokeless tobacco: Not on file  . Alcohol Use: No  . Drug Use: No  .  Sexual Activity: Yes   Other Topics Concern  . Not on file   Social History Narrative    Objective:  Filed Vitals:   10/21/14 1013  BP: 140/90  Pulse: 67  Temp: 98.3 F (36.8 C)  Resp: 16     General appearance: alert, cooperative and appears stated age Ears: normal TM's and external ear canals both ears Throat: lips, mucosa, and tongue normal; teeth and gums normal Neck: no adenopathy, no carotid bruit, supple, symmetrical, trachea midline and thyroid not enlarged, symmetric, no tenderness/mass/nodules Back: symmetric, no curvature. ROM normal. No CVA tenderness. Lungs: clear to auscultation bilaterally Heart: regular rate and rhythm, S1, S2 normal, no murmur,  click, rub or gallop Abdomen: soft, non-tender; bowel sounds normal; no masses,  no organomegaly Pulses: 2+ and symmetric Skin: Skin color, texture, turgor normal. No rashes or lesions Lymph nodes: Cervical, supraclavicular, and axillary nodes normal.  Assessment and Plan:  Obesity, morbid I have addressed  BMI  (Body mass index is 44.91 kg/(m^2). and recommended wt loss of 10% of body weigh over the next 6 months using a low glycemic index diet and regular exercise a minimum of 5 days per week.    Wt Readings from Last 3 Encounters:  10/21/14 261 lb 12 oz (118.729 kg)  08/05/14 260 lb 12.8 oz (118.298 kg)  02/01/14 263 lb 4 oz (119.409 kg)     Essential hypertension, benign Almost  controlled today, despite medication noncompliance.   She wants to resume  hctz despite her history of muscle cramps.  nd lisinopril caused sudden onset of facial edema.  Maxzide caused joint pain , and metoprolol was not tolerated after one dose. She did not start amlodipine and tolerates but  has been out of  Spironolactone for the past month and is only treating BP with apple cider vinegar.   Will renew spironolactone for management of  premenstrual edema.   Lab Results  Component Value Date   CREATININE 0.91 11/01/2014   Lab Results  Component Value Date   NA 138 11/01/2014   K 4.1 11/01/2014   CL 105 11/01/2014   CO2 26 11/01/2014      Diabetes mellitus type 2 in obese She did not tolerate metformin and stopped it after last visit in July.  She has not lost weight and is not exercising regularly, but has reduced her a1c to 5.9 .  She had an adverse reaction to lisinopril which was suggestive of angioedema so I am reluctant to try losartan. continue daily baby aspirin advised and annual eye exam.   I have addressed  BMI and recommended wt loss of 10% of body weight, or 26 lbs  over the next 6 months using a low glycemic index diet and regular exercise a minimum of 5 days per week.   Lab  Results  Component Value Date   HGBA1C 5.9 11/01/2014   Lab Results  Component Value Date   MICROALBUR <0.7 11/01/2014         Updated Medication List Outpatient Encounter Prescriptions as of 11/01/2014  Medication Sig  . spironolactone (ALDACTONE) 50 MG tablet Take 1 tablet (50 mg total) by mouth once.  . [DISCONTINUED] spironolactone (ALDACTONE) 50 MG tablet Take 50 mg by mouth once.  . Blood Pressure KIT Use daily to check blood pressure  . hydrochlorothiazide (HYDRODIURIL) 25 MG tablet Take 1 tablet (25 mg total) by mouth daily.  . potassium chloride SA (K-DUR,KLOR-CON) 20 MEQ tablet Take 1 tablet (20 mEq total) by  mouth daily.  . [DISCONTINUED] amLODipine (NORVASC) 5 MG tablet Take 1 tablet (5 mg total) by mouth daily. (Patient not taking: Reported on 11/01/2014)  . [DISCONTINUED] hydrochlorothiazide (HYDRODIURIL) 25 MG tablet Take 1 tablet (25 mg total) by mouth daily.  . [DISCONTINUED] metoprolol tartrate (LOPRESSOR) 25 MG tablet Take 1 tablet (25 mg total) by mouth 2 (two) times daily. (Patient not taking: Reported on 11/01/2014)     Orders Placed This Encounter  Procedures  . TSH  . Magnesium    Return in about 3 months (around 01/31/2015) for follow up diabetes.

## 2014-11-01 NOTE — Progress Notes (Signed)
Pre-visit discussion using our clinic review tool. No additional management support is needed unless otherwise documented below in the visit note.  

## 2014-11-03 ENCOUNTER — Encounter: Payer: Self-pay | Admitting: Internal Medicine

## 2014-11-04 MED ORDER — HYDROCHLOROTHIAZIDE 25 MG PO TABS: 25.0000 mg | ORAL_TABLET | Freq: Every day | ORAL | Status: DC

## 2014-11-04 MED ORDER — SPIRONOLACTONE 50 MG PO TABS: 50.0000 mg | ORAL_TABLET | Freq: Once | ORAL | Status: DC

## 2014-11-04 NOTE — Assessment & Plan Note (Addendum)
Almost  controlled today, despite medication noncompliance.   She wants to resume  hctz despite her history of muscle cramps.  nd lisinopril caused sudden onset of facial edema.  Maxzide caused joint pain , and metoprolol was not tolerated after one dose. She did not start amlodipine and tolerates but  has been out of  Spironolactone for the past month and is only treating BP with apple cider vinegar.   Will renew spironolactone for management of  premenstrual edema.   Lab Results  Component Value Date   CREATININE 0.91 11/01/2014   Lab Results  Component Value Date   NA 138 11/01/2014   K 4.1 11/01/2014   CL 105 11/01/2014   CO2 26 11/01/2014

## 2014-11-04 NOTE — Assessment & Plan Note (Signed)
She did not tolerate metformin and stopped it after last visit in July.  She has not lost weight and is not exercising regularly, but has reduced her a1c to 5.9 .  She had an adverse reaction to lisinopril which was suggestive of angioedema so I am reluctant to try losartan. continue daily baby aspirin advised and annual eye exam.   I have addressed  BMI and recommended wt loss of 10% of body weight, or 26 lbs  over the next 6 months using a low glycemic index diet and regular exercise a minimum of 5 days per week.   Lab Results  Component Value Date   HGBA1C 5.9 11/01/2014   Lab Results  Component Value Date   MICROALBUR <0.7 11/01/2014

## 2014-11-04 NOTE — Assessment & Plan Note (Addendum)
I have addressed  BMI  (Body mass index is 44.91 kg/(m^2). and recommended wt loss of 10% of body weigh over the next 6 months using a low glycemic index diet and regular exercise a minimum of 5 days per week.    Wt Readings from Last 3 Encounters:  10/21/14 261 lb 12 oz (118.729 kg)  08/05/14 260 lb 12.8 oz (118.298 kg)  02/01/14 263 lb 4 oz (119.409 kg)

## 2015-01-17 ENCOUNTER — Ambulatory Visit (INDEPENDENT_AMBULATORY_CARE_PROVIDER_SITE_OTHER): Payer: PRIVATE HEALTH INSURANCE | Admitting: Internal Medicine

## 2015-01-17 ENCOUNTER — Encounter: Payer: Self-pay | Admitting: Internal Medicine

## 2015-01-17 VITALS — BP 120/78 | HR 70 | Temp 98.7°F | Resp 14 | Ht 64.0 in | Wt 258.5 lb

## 2015-01-17 DIAGNOSIS — R1011 Right upper quadrant pain: Secondary | ICD-10-CM | POA: Diagnosis not present

## 2015-01-17 DIAGNOSIS — E119 Type 2 diabetes mellitus without complications: Secondary | ICD-10-CM

## 2015-01-17 DIAGNOSIS — R1013 Epigastric pain: Secondary | ICD-10-CM | POA: Diagnosis not present

## 2015-01-17 DIAGNOSIS — G8929 Other chronic pain: Secondary | ICD-10-CM

## 2015-01-17 DIAGNOSIS — I10 Essential (primary) hypertension: Secondary | ICD-10-CM

## 2015-01-17 DIAGNOSIS — E1169 Type 2 diabetes mellitus with other specified complication: Secondary | ICD-10-CM

## 2015-01-17 DIAGNOSIS — E669 Obesity, unspecified: Secondary | ICD-10-CM

## 2015-01-17 DIAGNOSIS — K760 Fatty (change of) liver, not elsewhere classified: Secondary | ICD-10-CM

## 2015-01-17 DIAGNOSIS — R109 Unspecified abdominal pain: Secondary | ICD-10-CM | POA: Insufficient documentation

## 2015-01-17 NOTE — Assessment & Plan Note (Signed)
Etiology unclear,  And includes  transient incarceration of her ventral hernia, recurrent  mesenteric inflammation (seen on 2014 CT), vs or gallstones/biliary colic,  ultrasound of abdomen ordered and referral to [facility]

## 2015-01-17 NOTE — Progress Notes (Signed)
Subjective:  Patient ID: Nicole Burns, female    DOB: 05/04/1976  Age: 39 y.o. MRN: [id number]  CC: The primary encounter diagnosis was Essential hypertension, benign. Diagnoses of Abdominal pain, chronic, epigastric, Hepatic steatosis, Right upper quadrant pain, and Diabetes mellitus type 2 in obese were also pertinent to this visit.  HPI Nicole Burns presents for follow up on hypertension,  Fatty liver, pre diabetes  and morbid obesity.  She has not started exercising yet, and is not following a specific diet.   She has been having  recurrent  Prolonged episodes of mid epigastric and riqht upper quadrant abdominal pain, accompanied by Nausea,  anorexia and increased gas followed by multiple loose watery stools which last  for 4 to 5 days.  She has tried changing her diet  To exclude corn and things with seeds. Her last episode was last severe  And ended about two weeks ago.  She recalls that the episode was less severe,  and using motrin has helped the pain somewhat .  During the episodes she noted increased gastric sounds,  (patient stats that "it sounds like there's a monster in my stomach"  Prior episodes have resulted in work absences  And have occurred every 4 to 8 weeks.   Prior workup included CT abd and pelvis in 2014 that suggested only mesenteric inflammatory  and referral to Dr Allen Norris   by [facility].   Dr Allen Norris diagnosed an isolated episode of viral gastroenteritis.   Outpatient Prescriptions Prior to Visit  Medication Sig Dispense Refill  . Blood Pressure KIT Use daily to check blood pressure 1 each 0  . hydrochlorothiazide (HYDRODIURIL) 25 MG tablet Take 1 tablet (25 mg total) by mouth daily. 90 tablet 3  . potassium chloride SA (K-DUR,KLOR-CON) 20 MEQ tablet Take 1 tablet (20 mEq total) by mouth daily. 90 tablet 3  . spironolactone (ALDACTONE) 50 MG tablet Take 1 tablet (50 mg total) by mouth once. (Patient not taking: Reported on 01/17/2015) 90 tablet 1   No  facility-administered medications prior to visit.    Review of Systems;  Patient denies headache, fevers, malaise, unintentional weight loss, skin rash, eye pain, sinus congestion and sinus pain, sore throat, dysphagia,  hemoptysis , cough, dyspnea, wheezing, chest pain, palpitations, orthopnea, edema, , melena,  constipation, flank pain, dysuria, hematuria, urinary  Frequency, nocturia, numbness, tingling, seizures,  Focal weakness, Loss of consciousness,  Tremor, insomnia, depression, anxiety, and suicidal ideation.      Objective:  BP 120/78 mmHg  Pulse 70  Temp(Src) 98.7 F (37.1 C) (Oral)  Resp 14  Ht _0  (1.626 m)  Wt 258 lb 8 oz (117.255 kg)  BMI 44.35 kg/m2  SpO2 99%  LMP 01/15/2015  BP Readings from Last 3 Encounters:  01/17/15 120/78  10/21/14 140/90  08/05/14 160/100    Wt Readings from Last 3 Encounters:  01/17/15 258 lb 8 oz (117.255 kg)  10/21/14 261 lb 12 oz (118.729 kg)  08/05/14 260 lb 12.8 oz (118.298 kg)    General appearance: alert, cooperative and appears stated age Ears: normal TM's and external ear canals both ears Throat: lips, mucosa, and tongue normal; teeth and gums normal Neck: no adenopathy, no carotid bruit, supple, symmetrical, trachea midline and thyroid not enlarged, symmetric, no tenderness/mass/nodules Back: symmetric, no curvature. ROM normal. No CVA tenderness. Lungs: clear to auscultation bilaterally Heart: regular rate and rhythm, S1, S2 normal, no murmur, click, rub or gallop Abdomen: obese, soft, non-tender; bowel sounds normal; no masses,  no organomegaly Pulses: 2+ and symmetric Skin: Skin color, texture, turgor normal. No rashes or lesions Lymph nodes: Cervical, supraclavicular, and axillary nodes normal.  Lab Results  Component Value Date   HGBA1C 5.9 11/01/2014   HGBA1C 6.3 08/02/2014   HGBA1C 6.0 02/01/2014    Lab Results  Component Value Date   CREATININE 0.91 11/01/2014   CREATININE 0.95 08/02/2014   CREATININE  0.8 02/01/2014    Lab Results  Component Value Date   WBC 9.6 02/01/2014   HGB 10.3* 02/01/2014   HCT 33.2* 02/01/2014   PLT 308.0 02/01/2014   GLUCOSE 95 11/01/2014   CHOL 142 11/01/2014   TRIG 63.0 11/01/2014   HDL 39.50 11/01/2014   LDLCALC 90 11/01/2014   ALT 14 11/01/2014   AST 13 11/01/2014   NA 138 11/01/2014   K 4.1 11/01/2014   CL 105 11/01/2014   CREATININE 0.91 11/01/2014   BUN 10 11/01/2014   CO2 26 11/01/2014   TSH 1.01 11/01/2014   HGBA1C 5.9 11/01/2014   MICROALBUR <0.7 11/01/2014    No results found.  Assessment & Plan:   Problem List Items Addressed This Visit      Unprioritized   Essential hypertension, benign - Primary    Tolerating hctz currently,  Using the potassium supplement daily        Diabetes mellitus type 2 in obese    She did not tolerate metformin and stopped it after last visit in July.  She has not lost weight and is not exercising regularly, but has reduced her a1c to 5.9 .  She had an adverse reaction to lisinopril which was suggestive of angioedema so I am reluctant to try losartan. continue daily baby aspirin advised and annual eye exam.   I have addressed  BMI and recommended wt loss of 10% of body weight, or 26 lbs  over the next 6 months using a low glycemic index diet and regular exercise a minimum of 5 days per week.   Lab Results  Component Value Date   HGBA1C 5.9 11/01/2014   Lab Results  Component Value Date   MICROALBUR <0.7 11/01/2014             Hepatic steatosis    Current liver enzymes are normal and all modifiable risk factors including obesity, diabetes and hyperlipidemia have been addressed .  She has received vaccines for hepatitis A and B in the past.       Abdominal pain    Etiology unclear,  And includes  transient incarceration of her ventral hernia, recurrent  mesenteric inflammation (seen on 2014 CT), vs or gallstones/biliary colic,  ultrasound of abdomen ordered and referral to [facility]        Other Visit Diagnoses    Abdominal pain, chronic, epigastric        Relevant Orders    US Abdomen Limited       I am having [name] maintain her potassium chloride SA, Blood Pressure, spironolactone, and hydrochlorothiazide.  No orders of the defined types were placed in this encounter.    There are no discontinued medications.  Follow-up: Return in about 3 months (around 04/19/2015).   [name], MD

## 2015-01-17 NOTE — Progress Notes (Signed)
Pre-visit discussion using our clinic review tool. No additional management support is needed unless otherwise documented below in the visit note.  

## 2015-01-17 NOTE — Assessment & Plan Note (Signed)
Current liver enzymes are normal and all modifiable risk factors including obesity, diabetes and hyperlipidemia have been addressed .  She has received vaccines for hepatitis A and B in the past.

## 2015-01-17 NOTE — Patient Instructions (Addendum)
Ultrasound and possibly HIDA  Scan to evaluate your liver  [facility]  Referral  Trial of generic Bentyl ,  An antispasmodic for the next episode  Of abdominal pain,  Use with Gas X   Please call the next time you have an episode so Morey Hummingbird or I can see you   I want you to lose 12 lbs by your next appointment   Fatty Liver Fatty liver is the accumulation of fat in liver cells. It is also called hepatosteatosis or steatohepatitis. It is normal for your liver to contain some fat. If fat is more than 5 to 10% of your liver's weight, you have fatty liver.  There are often no symptoms (problems) for years while damage is still occurring. People often learn about their fatty liver when they have medical tests for other reasons. Fat can damage your liver for years or even decades without causing problems. When it becomes severe, it can cause fatigue, weight loss, weakness, and confusion. This makes you more likely to develop more serious liver problems. The liver is the largest organ in the body. It does a lot of work and often gives no warning signs when it is sick until late in a disease. The liver has many important jobs including:  Breaking down foods.  Storing vitamins, iron, and other minerals.  Making proteins.  Making bile for food digestion.  Breaking down many products including medications, alcohol and some poisons. CAUSES  There are a number of different conditions, medications, and poisons that can cause a fatty liver. Eating too many calories causes fat to build up in the liver. Not processing and breaking fats down normally may also cause this. Certain conditions, such as obesity, diabetes, and high triglycerides also cause this. Most fatty liver patients tend to be middle-aged and over weight.  Some causes of fatty liver are:  Alcohol over consumption.  Malnutrition.  Steroid use.  Valproic acid toxicity.  Obesity.  Cushing's syndrome.  Poisons.  Tetracycline in  high dosages.  Pregnancy.  Diabetes.  Hyperlipidemia.  Rapid weight loss. Some people develop fatty liver even having none of these conditions. SYMPTOMS  Fatty liver most often causes no problems. This is called asymptomatic.  It can be diagnosed with blood tests and also by a liver biopsy.  It is one of the most common causes of minor elevations of liver enzymes on routine blood tests.  Specialized Imaging of the liver using ultrasound, CT (computed tomography) scan, or MRI (magnetic resonance imaging) can suggest a fatty liver but a biopsy is needed to confirm it.  A biopsy involves taking a small sample of liver tissue. This is done by using a needle. It is then looked at under a microscope by a specialist. TREATMENT  It is important to treat the cause. Simple fatty liver without a medical reason may not need treatment.  Weight loss, fat restriction, and exercise in overweight patients produces inconsistent results but is worth trying.  Fatty liver due to alcohol toxicity may not improve even with stopping drinking.  Good control of diabetes may reduce fatty liver.  Lower your triglycerides through diet, medication or both.  Eat a balanced, healthy diet.  Increase your physical activity.  Get regular checkups from a liver specialist.  There are no medical or surgical treatments for a fatty liver or NASH, but improving your diet and increasing your exercise may help prevent or reverse some of the damage. PROGNOSIS  Fatty liver may cause no damage or  it can lead to an inflammation of the liver. This is, called steatohepatitis. When it is linked to alcohol abuse, it is called alcoholic steatohepatitis. It often is not linked to alcohol. It is then called nonalcoholic steatohepatitis, or NASH. Over time the liver may become scarred and hardened. This condition is called cirrhosis. Cirrhosis is serious and may lead to liver failure or cancer. NASH is one of the leading causes of  cirrhosis. About 10-20% of Americans have fatty liver and a smaller 2-5% has NASH. Document Released: 08/22/2005 Document Revised: 09/29/2011 Document Reviewed: 11/16/2013 [facility] Patient Information 2015 [place], [place]. This information is not intended to replace advice given to you by your health care provider. Make sure you discuss any questions you have with your health care provider.

## 2015-01-17 NOTE — Assessment & Plan Note (Addendum)
Tolerating hctz currently,  Using the potassium supplement daily

## 2015-01-17 NOTE — Assessment & Plan Note (Signed)
She did not tolerate metformin and stopped it after last visit in July.  She has not lost weight and is not exercising regularly, but has reduced her a1c to 5.9 .  She had an adverse reaction to lisinopril which was suggestive of angioedema so I am reluctant to try losartan. continue daily baby aspirin advised and annual eye exam.   I have addressed  BMI and recommended wt loss of 10% of body weight, or 26 lbs  over the next 6 months using a low glycemic index diet and regular exercise a minimum of 5 days per week.   Lab Results  Component Value Date   HGBA1C 5.9 11/01/2014   Lab Results  Component Value Date   MICROALBUR <0.7 11/01/2014

## 2015-01-28 ENCOUNTER — Other Ambulatory Visit: Payer: Self-pay | Admitting: Internal Medicine

## 2015-01-28 DIAGNOSIS — R1084 Generalized abdominal pain: Secondary | ICD-10-CM

## 2015-02-01 ENCOUNTER — Encounter: Payer: Self-pay | Admitting: Internal Medicine

## 2015-02-01 ENCOUNTER — Other Ambulatory Visit: Payer: Self-pay | Admitting: Internal Medicine

## 2015-02-01 ENCOUNTER — Ambulatory Visit
Admission: RE | Admit: 2015-02-01 | Discharge: 2015-02-01 | Disposition: A | Discharge status: Home / Self Care | Payer: PRIVATE HEALTH INSURANCE | Source: Ambulatory Visit | Attending: Internal Medicine | Admitting: Internal Medicine

## 2015-02-01 DIAGNOSIS — K7689 Other specified diseases of liver: Secondary | ICD-10-CM | POA: Diagnosis not present

## 2015-02-01 DIAGNOSIS — K824 Cholesterolosis of gallbladder: Secondary | ICD-10-CM | POA: Insufficient documentation

## 2015-02-01 DIAGNOSIS — K760 Fatty (change of) liver, not elsewhere classified: Secondary | ICD-10-CM

## 2015-02-01 DIAGNOSIS — R1084 Generalized abdominal pain: Secondary | ICD-10-CM | POA: Diagnosis present

## 2015-02-01 DIAGNOSIS — R1013 Epigastric pain: Secondary | ICD-10-CM

## 2015-02-05 ENCOUNTER — Ambulatory Visit: Payer: No Typology Code available for payment source | Admitting: Internal Medicine

## 2015-02-14 ENCOUNTER — Encounter: Payer: Self-pay | Admitting: Physician Assistant

## 2015-03-05 ENCOUNTER — Encounter: Payer: Self-pay | Admitting: Physician Assistant

## 2015-03-05 ENCOUNTER — Other Ambulatory Visit (INDEPENDENT_AMBULATORY_CARE_PROVIDER_SITE_OTHER): Payer: PRIVATE HEALTH INSURANCE

## 2015-03-05 ENCOUNTER — Ambulatory Visit (INDEPENDENT_AMBULATORY_CARE_PROVIDER_SITE_OTHER): Payer: PRIVATE HEALTH INSURANCE | Admitting: Physician Assistant

## 2015-03-05 VITALS — BP 130/84 | HR 64 | Ht 63.0 in | Wt 257.4 lb

## 2015-03-05 DIAGNOSIS — Z8719 Personal history of other diseases of the digestive system: Secondary | ICD-10-CM

## 2015-03-05 DIAGNOSIS — K439 Ventral hernia without obstruction or gangrene: Secondary | ICD-10-CM

## 2015-03-05 DIAGNOSIS — R109 Unspecified abdominal pain: Secondary | ICD-10-CM

## 2015-03-05 DIAGNOSIS — R197 Diarrhea, unspecified: Secondary | ICD-10-CM | POA: Diagnosis not present

## 2015-03-05 DIAGNOSIS — R112 Nausea with vomiting, unspecified: Secondary | ICD-10-CM

## 2015-03-05 LAB — COMPREHENSIVE METABOLIC PANEL
ALBUMIN: 3.9 g/dL (ref 3.5–5.2)
ALK PHOS: 57 U/L (ref 39–117)
ALT: 11 U/L (ref 0–35)
AST: 12 U/L (ref 0–37)
BUN: 11 mg/dL (ref 6–23)
CALCIUM: 9.2 mg/dL (ref 8.4–10.5)
CHLORIDE: 105 meq/L (ref 96–112)
CO2: 26 mEq/L (ref 19–32)
Creatinine, Ser: 0.84 mg/dL (ref 0.40–1.20)
GFR: 95.91 mL/min (ref >60.00)
Glucose, Bld: 110 mg/dL — ABNORMAL HIGH (ref 70–99)
POTASSIUM: 4.1 meq/L (ref 3.5–5.1)
Sodium: 136 mEq/L (ref 135–145)
Total Bilirubin: 0.5 mg/dL (ref 0.2–1.2)
Total Protein: 7.5 g/dL (ref 6.0–8.3)

## 2015-03-05 LAB — CBC WITH DIFFERENTIAL/PLATELET
BASOS PCT: 0.4 % (ref 0.0–3.0)
Basophils Absolute: 0 10*3/uL (ref 0.0–0.1)
EOS PCT: 2 % (ref 0.0–5.0)
Eosinophils Absolute: 0.2 10*3/uL (ref 0.0–0.7)
HEMATOCRIT: 31.6 % — AB (ref 36.0–46.0)
HEMOGLOBIN: 10 g/dL — AB (ref 12.0–15.0)
LYMPHS PCT: 29.3 % (ref 12.0–46.0)
Lymphs Abs: 2.4 10*3/uL (ref 0.7–4.0)
MCHC: 31.6 g/dL (ref 30.0–36.0)
MCV: 78.3 fl (ref 78.0–100.0)
MONOS PCT: 11.5 % (ref 3.0–12.0)
Monocytes Absolute: 0.9 10*3/uL (ref 0.1–1.0)
Neutro Abs: 4.6 10*3/uL (ref 1.4–7.7)
Neutrophils Relative %: 56.8 % (ref 43.0–77.0)
Platelets: 337 10*3/uL (ref 150.0–400.0)
RBC: 4.03 Mil/uL (ref 3.87–5.11)
RDW: 16.2 % — AB (ref 11.5–15.5)
WBC: 8.2 10*3/uL (ref 4.0–10.5)

## 2015-03-05 NOTE — Patient Instructions (Signed)
Your physician has requested that you go to the basement for lab work before leaving today     You have been scheduled for a CT scan of the abdomen and pelvis at [facility] ([address]---this is in the same building as [facility]).   You are scheduled on 02/24/2015 at 3:00pm. You should arrive 15 minutes prior to your appointment time for registration. Please follow the written instructions below on the day of your exam:  WARNING: IF YOU ARE ALLERGIC TO IODINE/X-RAY DYE, PLEASE NOTIFY RADIOLOGY IMMEDIATELY AT [phone]! YOU WILL BE GIVEN A 13 HOUR PREMEDICATION PREP.  1) Do not eat or drink anything after 11:00am (4 hours prior to your test) 2) You have been given 2 bottles of oral contrast to drink. The solution may taste               better if refrigerated, but do NOT add ice or any other liquid to this solution. Shake             well before drinking.    Drink 1 bottle of contrast @ 1;00PM (2 hours prior to your exam)  Drink 1 bottle of contrast @ 2:00PM (1 hour prior to your exam)  You may take any medications as prescribed with a small amount of water except for the following: Metformin, Glucophage, Glucovance, Avandamet, Riomet, Fortamet, Actoplus Met, Janumet, Glumetza or Metaglip. The above medications must be held the day of the exam AND 48 hours after the exam.  The purpose of you drinking the oral contrast is to aid in the visualization of your intestinal tract. The contrast solution may cause some diarrhea. Before your exam is started, you will be given a small amount of fluid to drink. Depending on your individual set of symptoms, you may also receive an intravenous injection of x-ray contrast/dye. Plan on being at [facility] for 30 minutes or long, depending on the type of exam you are having performed.  This test typically takes 30-45 minutes to complete.  If you have any questions regarding your exam or if you need to reschedule, you may  call the CT department at [phone] between the hours of 8:00 am and 5:00 pm, Monday-Friday.  ________________________________________________________________________

## 2015-03-05 NOTE — Progress Notes (Signed)
Patient ID: Nicole Burns, female   DOB: [birth year], 39 y.o.   MRN: [id number]   Subjective:    Patient ID: Nicole Burns, female    DOB: 1975-09-15, 39 y.o.   MRN: [id number]  HPI Nicole Burns is a pleasant 39 year old African-American female new to GI today referred by Dr.Tullo for evaluation of episodic abdominal pain nausea vomiting and diarrhea. Patient has history of adult-onset diabetes mellitus, hypertension, and morbid obesity. She is status post right inguinal hernia repair and has had 2 prior C-sections. Patient says she has been having symptoms sporadically over the past couple of years. She says the episodes seem to have no pattern and start oat out of the blue". Her last episode was in late June 2016 and says this was not quite as bad as the one prior. She will have abrupt onset of hard cramping mid abdominal pain which she describes as "like hard labor pains" which, and go in her midabdomen and spread down to her lower abdomen.'s will be associated with multiple episodes of nausea and vomiting and then diarrhea. She says her worst episodes will last for 3-4 days and then resolve. She says her abdomen is usually sore for a few days afterwards and then eats she is fine in between. She had recent upper abdominal ultrasound done in July 2016 no gallstones normal common bile duct one small hepatic cyst 2 cm and slight echogenicity consistent with fatty infiltration of the liver. Reviewing her records she had CT of the abdomen and pelvis in 2014 for abdominal pain nausea and vomiting and this showed a umbilical hernia containing a loop of small bowel and just inferior to this is a second hernia with an opening of 9.42 cm and the hernia sac measuring 5.1 x 11.3 cm there are loops of thickened wall small bowel appreciated within the hernia also entering and exiting the hernia and mild inflammatory change within the surrounding mesenteric fat with a very small fluid collection anteriorly within the  hernia.  Review of Systems Pertinent positive and negative review of systems were noted in the above HPI section.  All other review of systems was otherwise negative.  Outpatient Encounter Prescriptions as of 03/05/2015  Medication Sig  . Blood Pressure KIT Use daily to check blood pressure  . hydrochlorothiazide (HYDRODIURIL) 25 MG tablet Take 1 tablet (25 mg total) by mouth daily.  . potassium chloride SA (K-DUR,KLOR-CON) 20 MEQ tablet Take 1 tablet (20 mEq total) by mouth daily.  . [DISCONTINUED] spironolactone (ALDACTONE) 50 MG tablet Take 1 tablet (50 mg total) by mouth once. (Patient not taking: Reported on 01/17/2015)   No facility-administered encounter medications on file as of 03/05/2015.   Allergies  Allergen Reactions  . Lisinopril Shortness Of Breath  . Maxzide [Hydrochlorothiazide W-Triamterene] Other (See Comments)    Joint pain,  Malaise and bad mood   Patient Active Problem List   Diagnosis Date Noted  . Hepatic steatosis 01/17/2015  . Abdominal pain 01/17/2015  . Encounter for preventive health examination 02/04/2014  . Menorrhagia 05/04/2013  . Recurrent ventral hernia 01/24/2013  . Umbilical hernia   . Diabetes mellitus type 2 in obese 10/31/2012  . Obesity, morbid 10/13/2012  . Premenstrual symptom 10/13/2012  . Essential hypertension, benign 10/13/2012   Social History   Social History  . Marital Status: Married    Spouse Name: N/A  . Number of Children: N/A  . Years of Education: N/A   Occupational History  . Not on file.   Social History  Main Topics  . Smoking status: Never Smoker   . Smokeless tobacco: Not on file  . Alcohol Use: No  . Drug Use: No  . Sexual Activity: Yes   Other Topics Concern  . Not on file   Social History Narrative    Ms. Hatchell's family history includes Cancer in her maternal grandmother; Diabetes in her father and paternal grandmother; Heart disease in her father; Hyperlipidemia in her father and maternal  grandmother; Hypertension in her father and mother; Kidney disease in her paternal grandmother; Stroke in her father.      Objective:    Filed Vitals:   03/05/15 1033  BP: 130/84  Pulse: 64    Physical Exam  well-developed African-American female in no acute distress, pleasant blood pressure 130/84 pulse 64 height 5 foot 3 weight 257, BMI 44. HEENT ;nontraumatic normocephalic EOMI PERRLA sclera anicteric, Neck; supple no JVD, Cardiovascular; regular rate and rhythm with S1-S2 no murmur or gallop, Pulmonary ;clear bilaterally, Abdomen; large soft bowel sounds are presents currently nontender nondistended no palpable mass or hepatosplenomegaly she does have an umbilical hernia which is reducible and I believe a large abdominal wall hernia below the umbilicus, Rectal ;exam not done, Extremities; no clubbing cyanosis or edema skin warm and dry, Neuropsych; mood and affect appropriate       Assessment & Plan:   #31 39 year old female with 2+ year history of sporadic episodes of acute intense abdominal cramping, pain nausea vomiting and diarrhea. I suspect her symptoms may be secondary to intermittent incarceration of previously documented. Umbilical and infraumbilical abdominal wall hernias. #2 hepatic steatosis-liver tests documented normal #3 adult-onset diabetes mellitus #4 morbid obesity #5 hypertension  Plan; Ct abd/pelvis with contrast this week  Likely will need surgical consult  for hernia repairs   Merranda Bolls S Laurence Folz PA-C 03/05/2015   Cc: [name], MD

## 2015-03-06 ENCOUNTER — Ambulatory Visit (INDEPENDENT_AMBULATORY_CARE_PROVIDER_SITE_OTHER)
Admission: RE | Admit: 2015-03-06 | Discharge: 2015-03-06 | Disposition: A | Discharge status: Home / Self Care | Payer: PRIVATE HEALTH INSURANCE | Source: Ambulatory Visit | Attending: Physician Assistant | Admitting: Physician Assistant

## 2015-03-06 DIAGNOSIS — R197 Diarrhea, unspecified: Secondary | ICD-10-CM

## 2015-03-06 DIAGNOSIS — R109 Unspecified abdominal pain: Secondary | ICD-10-CM | POA: Diagnosis not present

## 2015-03-06 DIAGNOSIS — K439 Ventral hernia without obstruction or gangrene: Secondary | ICD-10-CM

## 2015-03-06 DIAGNOSIS — R112 Nausea with vomiting, unspecified: Secondary | ICD-10-CM

## 2015-03-06 DIAGNOSIS — Z8719 Personal history of other diseases of the digestive system: Secondary | ICD-10-CM | POA: Diagnosis not present

## 2015-03-06 MED ORDER — IOHEXOL 300 MG/ML  SOLN
100.0000 mL | Freq: Once | INTRAMUSCULAR | Status: AC | PRN
  Administered 2015-03-06: 100 mL via INTRAVENOUS

## 2015-03-08 ENCOUNTER — Telehealth: Payer: Self-pay

## 2015-03-08 NOTE — Telephone Encounter (Signed)
Patient aware. Appointment with [facility] Dr Rosendo Gros 03/23/15 arrive at 10:30

## 2015-03-08 NOTE — Telephone Encounter (Signed)
-----   Message from Alfredia Ferguson, PA-C sent at 03/07/2015 12:24 PM EDT ----- Please let pt know the Ct scan shows less fatty changes in liver, and the umbilical hernia and the second hernia midline below the umbilical hernia  Both look about the same- both have loops of small bowel in them but no current sign of blockage-.  Please get her referred to [facility]  For consult for repair of both hernias which are symptomatic, and causing intermittent episodes of acute pain, nausea/vomiting

## 2015-03-12 NOTE — Progress Notes (Signed)
Agree with Ms. Genia Harold assessment and plan. CT confirmed persistent hernias and [facility] consult ordered.  She also had a small but enlarging lesion in uterus - and pelvic US was recommended so would also have her see her GYN or arrange a GYN referral for this please  Gatha Mayer, MD, [facility]

## 2015-03-15 NOTE — Progress Notes (Signed)
Beth- please call pt and let her know the Ct scan she had also showed a probable fibroid in her uterus- just want to be sure that doesn't get ignored- ask he to see her GYN and let them know she had a CT-we can fax report as well- thanks

## 2015-04-18 ENCOUNTER — Telehealth: Payer: Self-pay | Admitting: *Deleted

## 2015-04-18 ENCOUNTER — Other Ambulatory Visit (INDEPENDENT_AMBULATORY_CARE_PROVIDER_SITE_OTHER): Payer: PRIVATE HEALTH INSURANCE

## 2015-04-18 DIAGNOSIS — E669 Obesity, unspecified: Secondary | ICD-10-CM | POA: Diagnosis not present

## 2015-04-18 DIAGNOSIS — K760 Fatty (change of) liver, not elsewhere classified: Secondary | ICD-10-CM

## 2015-04-18 DIAGNOSIS — E1169 Type 2 diabetes mellitus with other specified complication: Secondary | ICD-10-CM

## 2015-04-18 DIAGNOSIS — E119 Type 2 diabetes mellitus without complications: Secondary | ICD-10-CM | POA: Diagnosis not present

## 2015-04-18 DIAGNOSIS — I10 Essential (primary) hypertension: Secondary | ICD-10-CM

## 2015-04-18 LAB — COMPREHENSIVE METABOLIC PANEL
ALBUMIN: 3.8 g/dL (ref 3.5–5.2)
ALT: 12 U/L (ref 0–35)
AST: 11 U/L (ref 0–37)
Alkaline Phosphatase: 60 U/L (ref 39–117)
BUN: 15 mg/dL (ref 6–23)
CALCIUM: 9.2 mg/dL (ref 8.4–10.5)
CHLORIDE: 105 meq/L (ref 96–112)
CO2: 27 mEq/L (ref 19–32)
CREATININE: 0.95 mg/dL (ref 0.40–1.20)
GFR: 83.16 mL/min (ref >60.00)
Glucose, Bld: 127 mg/dL — ABNORMAL HIGH (ref 70–99)
POTASSIUM: 4.3 meq/L (ref 3.5–5.1)
Sodium: 138 mEq/L (ref 135–145)
Total Bilirubin: 0.4 mg/dL (ref 0.2–1.2)
Total Protein: 6.9 g/dL (ref 6.0–8.3)

## 2015-04-18 LAB — LIPID PANEL
CHOL/HDL RATIO: 3
CHOLESTEROL: 147 mg/dL (ref 0–200)
HDL: 43.4 mg/dL (ref >39.00)
LDL CALC: 90 mg/dL (ref 0–99)
NonHDL: 103.35
Triglycerides: 69 mg/dL (ref 0.0–149.0)
VLDL: 13.8 mg/dL (ref 0.0–40.0)

## 2015-04-18 LAB — HEMOGLOBIN A1C: HEMOGLOBIN A1C: 6 % (ref 4.6–6.5)

## 2015-04-18 NOTE — Telephone Encounter (Signed)
Labs and dx?  

## 2015-04-20 ENCOUNTER — Ambulatory Visit (INDEPENDENT_AMBULATORY_CARE_PROVIDER_SITE_OTHER): Payer: PRIVATE HEALTH INSURANCE | Admitting: Internal Medicine

## 2015-04-20 ENCOUNTER — Encounter: Payer: Self-pay | Admitting: Internal Medicine

## 2015-04-20 VITALS — BP 132/70 | HR 86 | Temp 98.0°F | Wt 257.0 lb

## 2015-04-20 DIAGNOSIS — E119 Type 2 diabetes mellitus without complications: Secondary | ICD-10-CM | POA: Diagnosis not present

## 2015-04-20 DIAGNOSIS — E1169 Type 2 diabetes mellitus with other specified complication: Secondary | ICD-10-CM

## 2015-04-20 DIAGNOSIS — R1084 Generalized abdominal pain: Secondary | ICD-10-CM

## 2015-04-20 DIAGNOSIS — N9489 Other specified conditions associated with female genital organs and menstrual cycle: Secondary | ICD-10-CM

## 2015-04-20 DIAGNOSIS — K432 Incisional hernia without obstruction or gangrene: Secondary | ICD-10-CM

## 2015-04-20 DIAGNOSIS — N858 Other specified noninflammatory disorders of uterus: Secondary | ICD-10-CM

## 2015-04-20 DIAGNOSIS — I10 Essential (primary) hypertension: Secondary | ICD-10-CM

## 2015-04-20 DIAGNOSIS — Z23 Encounter for immunization: Secondary | ICD-10-CM

## 2015-04-20 DIAGNOSIS — E669 Obesity, unspecified: Secondary | ICD-10-CM

## 2015-04-20 MED ORDER — PHENTERMINE HCL 37.5 MG PO TABS: ORAL_TABLET | ORAL | Status: DC

## 2015-04-20 NOTE — Assessment & Plan Note (Signed)
Recurrent ,  With ventral hernia and transient bowel  incarceration suspected.  Complicated surgical repair advised which will include revision and replacement of prior mesh .

## 2015-04-20 NOTE — Assessment & Plan Note (Signed)
Well controlled on current regimen. Renal function stable, no changes today.  Lab Results  Component Value Date   CREATININE 0.95 04/18/2015   Lab Results  Component Value Date   NA 138 04/18/2015   K 4.3 04/18/2015   CL 105 04/18/2015   CO2 27 04/18/2015

## 2015-04-20 NOTE — Assessment & Plan Note (Signed)
She has had difficulty losing weight due to lack of motivation, which is now not an issue since she saw Surgery. She is requesting a trial of  Phentermine.  She is aware of the possible side effects and risks and understands that    The medication will be discontinued if she has not lost 5% of her body weight over the next 3 months, which , based on today's weight is 15 lbs.

## 2015-04-20 NOTE — Patient Instructions (Addendum)
I have authorized the use of phentermine for 3 months.  Please return in one week to have have your vital signs checked and your iron studies repeated.  Return to see me in 3 months.You can either take the whole tablet in the morning,  Or take  1`/2 tablet in the morning,  The second half by 2 PM to avoid insomnia.  You should be able to lose a minimu of 15 and a maximum of 30 lbs  by the time you return.  Phentermine tablets or capsules What is this medicine? PHENTERMINE (FEN ter meen) decreases your appetite. It is used with a reduced calorie diet and exercise to help you lose weight. This medicine may be used for other purposes; ask your health care Nicole Nicole Burns or pharmacist if you have questions. COMMON BRAND NAME(S): Adipex-P, Atti-Plex P, Atti-Plex P Spansule, Fastin, Pro-Fast, Tara-8 What should I tell my health care Nicole Nicole Burns before I take this medicine? They need to know if you have any of these conditions: -agitation -glaucoma -heart disease -high blood pressure -history of substance abuse -lung disease called Primary Pulmonary Hypertension (PPH) -taken an MAOI like Carbex, Eldepryl, Marplan, Nardil, or Parnate in last 14 days -thyroid disease -an unusual or allergic reaction to phentermine, other medicines, foods, dyes, or preservatives -pregnant or trying to get pregnant -breast-feeding How should I use this medicine? Take this medicine by mouth with a glass of water. Follow the directions on the prescription label. This medicine is usually taken 30 minutes before or 1 to 2 hours after breakfast. Avoid taking this medicine in the evening. It may interfere with sleep. Take your doses at regular intervals. Do not take your medicine more often than directed. Talk to your pediatrician regarding the use of this medicine in children. Special care may be needed. Overdosage: If you think you have taken too much of this medicine contact a poison control center or emergency room at  once. NOTE: This medicine is only for you. Do not share this medicine with others. What if I miss a dose? If you miss a dose, take it as soon as you can. If it is almost time for your next dose, take only that dose. Do not take double or extra doses. What may interact with this medicine? Do not take this medicine with any of the following medications: -duloxetine -MAOIs like Carbex, Eldepryl, Marplan, Nardil, and Parnate -medicines for colds or breathing difficulties like pseudoephedrine or phenylephrine -procarbazine -sibutramine -SSRIs like citalopram, escitalopram, fluoxetine, fluvoxamine, paroxetine, and sertraline -stimulants like dexmethylphenidate, methylphenidate or modafinil -venlafaxine This medicine may also interact with the following medications: -medicines for diabetes This list may not describe all possible interactions. Give your health care Nicole Nicole Burns a list of all the medicines, herbs, non-prescription drugs, or dietary supplements you use. Also tell them if you smoke, drink alcohol, or use illegal drugs. Some items may interact with your medicine. What should I watch for while using this medicine? Notify your physician immediately if you become short of breath while doing your normal activities. Do not take this medicine within 6 hours of bedtime. It can keep you from getting to sleep. Avoid drinks that contain caffeine and try to stick to a regular bedtime every night. This medicine was intended to be used in addition to a healthy diet and exercise. The best results are achieved this way. This medicine is only indicated for short-term use. Eventually your weight loss may level out. At that point, the drug will only help you maintain  your new weight. Do not increase or in any way change your dose without consulting your doctor. You may get drowsy or dizzy. Do not drive, use machinery, or do anything that needs mental alertness until you know how this medicine affects you. Do not  stand or sit up quickly, especially if you are an older patient. This reduces the risk of dizzy or fainting spells. Alcohol may increase dizziness and drowsiness. Avoid alcoholic drinks. What side effects may I notice from receiving this medicine? Side effects that you should report to your doctor or health care professional as soon as possible: -chest pain, palpitations -depression or severe changes in mood -increased blood pressure -irritability -nervousness or restlessness -severe dizziness -shortness of breath -problems urinating -unusual swelling of the legs -vomiting Side effects that usually do not require medical attention (report to your doctor or health care professional if they continue or are bothersome): -blurred vision or other eye problems -changes in sexual ability or desire -constipation or diarrhea -difficulty sleeping -dry mouth or unpleasant taste -headache -nausea This list may not describe all possible side effects. Call your doctor for medical advice about side effects. You may report side effects to FDA at 1-800-FDA-1088. Where should I keep my medicine? Keep out of the reach of children. This medicine can be abused. Keep your medicine in a safe place to protect it from theft. Do not share this medicine with anyone. Selling or giving away this medicine is dangerous and against the law. Store at room temperature between 20 and 25 degrees C (68 and 77 degrees F). Keep Nicole Burns tightly closed. Throw away any unused medicine after the expiration date. NOTE: This sheet is a summary. It may not cover all possible information. If you have questions about this medicine, talk to your doctor, pharmacist, or health care Nicole Nicole Burns.  2015, Elsevier/Gold Standard. (2010-08-21 11:02:44)   This is  my version of a  "Low GI"  Weight loss Diet:  It has enabled many of my patients to lose up to 10 lbs per month depending on how much you restrict your carbs.   follow it carefully.   The idea behind low carb diets is that your body has to take the fat and Nicole Burns in your food and turn it into an energy that is less efficient than carbohydrates, so you lose weight.    I have listed several choices at each of your 6 meal times to make it easy to shop.and plan    Remember that snack Substitutions should be equal to or less than 5 NET carbs per serving and  The only carbs in meals come from the pasta or vegetables so, they should be < 15 net carbs. Remember that carbohydrates from fiber do not count, so you can  subtract fiber grams to get the "net carbs " of any particular food item.    All of the foods can be found at grocery stores and in bulk at Nicole Nicole Burns.  The Nicole Nicole Burns Nicole Burns bars and shakes are available in more varieties at Nicole Nicole Burns, Nicole Nicole Burns and Nicole Nicole Burns.     7 AM Breakfast:  Choose from the following:  Low carbohydrate Nicole Burns  Shakes (I recommend the Nicole Nicole Burns "Carb Control" shakes, Nicole Nicole Burns,  Nicole Nicole Burns or Nicole Nicole Burns shakes  All are < 4  carbs . My favorite is the Nicole Nicole Burns chocolate   a scrambled egg/bacon/cheese burrito made with [employer] "carb balance" whole wheat tortilla  (this particular tortilla has only 6 net carbs, once  you subtract the fiber grams  )  A slice of home made fritatta (egg based dish without a crust:  google it)    Avoid cereal and bananas, oatmeal and cream of wheat and grits. They are loaded with carbohydrates and have too few fiber grams    10 AM: high Nicole Burns snack  Nicole Burns bar by Nicole Nicole Burns  Or Nicole Nicole Burns  (the lw sugar variety,  Not all are low , under 200 cal, usually < 6 net carbs).    A stick of cheese:  Around 1 carb,  100 cal      Other so called "Nicole Burns bars" and Greek yogurts tend to be loaded with carbohydrates.  Remember, in food advertising, the word "energy" is synonymous for " carbohydrate."  Lunch:   A Sandwich using the bread choices listed below,  You  Can use any  Eggs,  lunchmeat, grilled meat or canned tuna),  avocado, regular mayo/mustard  and cheese.  A Salad using blue cheese, ranch,  Goddess or vinagrette,  No croutons or "confetti" and no "candied nuts" but regular nuts OK. No "fat free": they add sugar for taste  No pretzels or chips.  Pickles and miniature sweet peppers are a good low carb alternative that provide a "crunch"   Please Note:  The bread is the only source of carbohydrate in a sandwich and  can be decreased by trying some of these alternatives to traditional loaf bread. Nicole Nicole Burns has the best selection:  Nicole Nicole Burns makes a pita bread and a flat ("Lavash")  bread that are 50 cal and 4 net carbs and are available at Nicole Nicole Burns and Nicole Nicole Burns.  These can be toasted to make them taste better,  And can be  used as a pita chip to use with hummous as well  Touf[employer] makes a low carb flatbread that's 100 cal and 9 net carbs available at Nicole Nicole Burns and Nicole Nicole Burns makes 2 sizes of  Low carb whole wheat tortilla  (The large one is 210 cal and 6 net carbs, the small is 120 cal and also 6 net carbs)  Flat Out makes flatbreads that are low carb as well . They're called "Fold Its")   Avoid "Low fat dressings, as well as Nicole Nicole Burns and Nicole Nicole Burns dressings They are loaded with sugar!   3 PM/ Mid day  Snack:  Consider  1 ounce of  almonds, walnuts, pistachios, pecans, peanuts,  Macadamia nuts or a nut medley are ok .  Avoid "granola"; the dried cranberries and raisins are loaded with carbohydrates. Mixed nuts as long as there are no raisins,  cranberries or dried fruit.    Try the prosciutto/mozzarella cheese sticks by Nicole Nicole Burns  In deli /backery section   High Nicole Burns 80 cal each , 1 carb   To avoid overindulging in snacks: Try drinking a glass of unsweeted almond/coconut Nicole Burns  Or a cup of coffee with your Nicole Nicole Burns chocolate bar to keep you from having 3!!!   Pork rinds!  Yes Pork Rinds  Are on the diet .  They are your potato chip.        6 PM  Dinner:     Meat/fowl/fish with a green salad, with  steamed/grilled/sauteed vegggie: broccoli, cauliflower, green beans, spinach, brussel sprouts or  Lima beans. DO NOT BREAD THE Nicole Burns!!      There is a low carb pasta by [employer] that is acceptable and tastes great: only 5 digestible carbs/serving.( All grocery stores but [employer]Js carry it )  Try  Michel Angelo's chicken piccata or chicken or eggplant parm over low carb pasta.(Lowes and [employer]Js)   Nicole Nicole Burns's "Carnitas" (pulled pork, no sauce,  0 carbs) can be used to make a dinner  burrito (available at Nicole Nicole Burns ) and his pot roast is also without veggies or potatoes   Pesto over low carb pasta (Nicole Nicole Burns sells a good quality pesto in the center refrigerated section of the deli   Try satueeing  Cheral Marker with mushrooms as another side dish   Whole wheat pasta is still full of digestible carbs and  Not as low in glycemic index as Dreamfield's.   Brown rice is still rice,  So skip the rice and noodles if you eat [place] or [place] (or at least limit to 1 cooked cup)  9 PM snack :   Breyer's "low carb" fudgsicle or  ice cream bar (Carb Smart line), or  Weight Watcher's ice cream bar , or another "no sugar added" ice cream;  a serving of fresh berries/cherries with whipped cream   Cheese or DANNON[employer]S LlG[employer] GREEK YOGURT or the [employer] greek yogurt   8 ounces of [place] unsweetened almond/cococunut Nicole Burns  Cheese and crackers (using WASA crackers,  They are low carb) or peanut butter on low carb crackers or pita bread     Avoid bananas, pineapple, grapes  and watermelon on a regular basis because they are high in sugar.  THINK OF THEM AS DESSERT. Stick to" berries and cherries"

## 2015-04-20 NOTE — Assessment & Plan Note (Signed)
She did not tolerate metformin and stopped it after last visit in July.  She has not lost weight and is not exercising regularly, but is now motivated because Surgery has advised her to do so. . continue daily baby aspirin advised and annual eye exam.   I have addressed  BMI and recommended trial of phentermine for goal  wt loss of 10% of body weight, or 26 lbs  over the next 3 to 6  months using a low glycemic index diet and regular exercise a minimum of 5 days per week.   Lab Results  Component Value Date   HGBA1C 6.0 04/18/2015   Lab Results  Component Value Date   MICROALBUR <0.7 11/01/2014

## 2015-04-20 NOTE — Progress Notes (Signed)
Pre visit review using our clinic review tool, if applicable. No additional management support is needed unless otherwise documented below in the visit note. 

## 2015-04-20 NOTE — Progress Notes (Signed)
Subjective:  Patient ID: Nicole Burns, female    DOB: 1975-11-04  Age: 38 y.o. MRN: [id number]  CC: The primary encounter diagnosis was Need for prophylactic vaccination and inoculation against influenza. Diagnoses of Diabetes mellitus type 2 in obese, Generalized abdominal pain, Recurrent ventral hernia, Obesity, morbid, and Essential hypertension, benign were also pertinent to this visit.  HPI Nicole Burns presents for  Follow up on  type 2 DM, with fatty liver and  morbid obesity . Patient has been referred to General Surgery after her GI evaluation for recurrent abdominal pain now determined to be due to recurrent bowel incarceration from ventral hernia.  She is dismayed that her surgery will require a week of hospitalization, and is now motivated to lose as much weight as possible prior to surgery.  She is not exercising yet or following a low carbohydrate diet, despite my repeated advisements over the last two years.     Outpatient Prescriptions Prior to Visit  Medication Sig Dispense Refill  . Blood Pressure KIT Use daily to check blood pressure 1 each 0  . hydrochlorothiazide (HYDRODIURIL) 25 MG tablet Take 1 tablet (25 mg total) by mouth daily. 90 tablet 3  . potassium chloride SA (K-DUR,KLOR-CON) 20 MEQ tablet Take 1 tablet (20 mEq total) by mouth daily. 90 tablet 3   No facility-administered medications prior to visit.    Review of Systems;  Patient denies headache, fevers, malaise, unintentional weight loss, skin rash, eye pain, sinus congestion and sinus pain, sore throat, dysphagia,  hemoptysis , cough, dyspnea, wheezing, chest pain, palpitations, orthopnea, edema, abdominal pain, nausea, melena, diarrhea, constipation, flank pain, dysuria, hematuria, urinary  Frequency, nocturia, numbness, tingling, seizures,  Focal weakness, Loss of consciousness,  Tremor, insomnia, depression, anxiety, and suicidal ideation.      Objective:  BP 132/70 mmHg  Pulse 86  Temp(Src) 98  F (36.7 C) (Oral)  Wt 257 lb (116.574 kg)  SpO2 98%  LMP 03/27/2015  BP Readings from Last 3 Encounters:  04/20/15 132/70  03/05/15 130/84  01/17/15 120/78    Wt Readings from Last 3 Encounters:  04/20/15 257 lb (116.574 kg)  03/05/15 257 lb 6.4 oz (116.756 kg)  01/17/15 258 lb 8 oz (117.255 kg)    General appearance: alert, cooperative and appears stated age  Neck: no adenopathy, no carotid bruit, supple, symmetrical, trachea midline and thyroid not enlarged, symmetric, no tenderness/mass/nodules Back: symmetric, no curvature. ROM normal. No CVA tenderness. Lungs: clear to auscultation bilaterally Heart: regular rate and rhythm, S1, S2 normal, no murmur, click, rub or gallop Abdomen: soft, non-tender; bowel sounds normal; no masses,  no organomegaly Pulses: 2+ and symmetric Skin: Skin color, texture, turgor normal. No rashes or lesions Lymph nodes: Cervical, supraclavicular, and axillary nodes normal.  Lab Results  Component Value Date   HGBA1C 6.0 04/18/2015   HGBA1C 5.9 11/01/2014   HGBA1C 6.3 08/02/2014    Lab Results  Component Value Date   CREATININE 0.95 04/18/2015   CREATININE 0.84 03/05/2015   CREATININE 0.91 11/01/2014    Lab Results  Component Value Date   WBC 8.2 03/05/2015   HGB 10.0* 03/05/2015   HCT 31.6* 03/05/2015   PLT 337.0 03/05/2015   GLUCOSE 127* 04/18/2015   CHOL 147 04/18/2015   TRIG 69.0 04/18/2015   HDL 43.40 04/18/2015   LDLCALC 90 04/18/2015   ALT 12 04/18/2015   AST 11 04/18/2015   NA 138 04/18/2015   K 4.3 04/18/2015   CL 105 04/18/2015   CREATININE  0.95 04/18/2015   BUN 15 04/18/2015   CO2 27 04/18/2015   TSH 1.01 11/01/2014   HGBA1C 6.0 04/18/2015   MICROALBUR <0.7 11/01/2014    Ct Abdomen Pelvis W Contrast  03/06/2015   CLINICAL DATA:  Chronic intermittent abdominal pain for 1 year. Nausea and vomiting. Diarrhea. Ventral hernia.  EXAM: CT ABDOMEN AND PELVIS WITH CONTRAST  TECHNIQUE: Multidetector CT imaging of the  abdomen and pelvis was performed using the standard protocol following bolus administration of intravenous contrast.  CONTRAST:  114m OMNIPAQUE IOHEXOL 300 MG/ML  SOLN  COMPARISON:  01/14/2013  FINDINGS: Lower Chest: No acute findings.  Hepatobiliary: Decreased hepatic steatosis noted since prior study. Multiple small hepatic cysts appears stable. No liver masses are identified. Gallbladder is unremarkable.  Pancreas: No mass, inflammatory changes, or other significant abnormality identified.  Spleen:  Within normal limits in size and appearance.  Adrenals:  No masses identified.  Kidneys/Urinary Tract: No evidence of masses or hydronephrosis. Probable tiny nonobstructive bilateral intrarenal calculi noted. A few tiny renal cysts again noted.  Stomach/Bowel/Peritoneum: Surgical mesh again seen within the lower anterior abdominal wall. A small paraumbilical hernia containing a small bowel loop shows no significant change. A moderate infraumbilical midline hernia is again noted also containing several small bowel loops. No evidence of small bowel obstruction or ischemia.  Vascular/Lymphatic: No pathologically enlarged lymph nodes identified. No abdominal aortic aneurysm or other significant retroperitoneal abnormality demonstrated.  Reproductive: 2.5 cm central uterine mass is seen which involves the endometrial cavity. This is increased in size and could represent a submucosal fibroid or endometrial polyp. Simple appearing right ovarian cyst or follicle also seen measuring 2.8 cm.  Other:  None.  Musculoskeletal:  No suspicious bone lesions identified.  IMPRESSION: No acute findings identified.  Small paraumbilical and moderate infraumbilical ventral hernias, both containing small bowel. No evidence of small bowel obstruction or ischemia.  Increased size of 2.5 cm central uterine mass, which may represent a submucosal fibroid or endometrial polyp. Recommend pelvic ultrasound for further evaluation.   Electronically  Signed   By: JEarle GellM.D.   On: 03/06/2015 17:18    Assessment & Plan:   Problem List Items Addressed This Visit    Obesity, morbid    She has had difficulty losing weight due to lack of motivation, which is now not an issue since she saw Surgery. She is requesting a trial of  Phentermine.  She is aware of the possible side effects and risks and understands that    The medication will be discontinued if she has not lost 5% of her body weight over the next 3 months, which , based on today's weight is 15 lbs.      Relevant Medications   phentermine (ADIPEX-P) 37.5 MG tablet   Essential hypertension, benign    Well controlled on current regimen. Renal function stable, no changes today.  Lab Results  Component Value Date   CREATININE 0.95 04/18/2015   Lab Results  Component Value Date   NA 138 04/18/2015   K 4.3 04/18/2015   CL 105 04/18/2015   CO2 27 04/18/2015         Diabetes mellitus type 2 in obese    She did not tolerate metformin and stopped it after last visit in July.  She has not lost weight and is not exercising regularly, but is now motivated because Surgery has advised her to do so. . continue daily baby aspirin advised and annual eye exam.   I  have addressed  BMI and recommended trial of phentermine for goal  wt loss of 10% of body weight, or 26 lbs  over the next 3 to 6  months using a low glycemic index diet and regular exercise a minimum of 5 days per week.   Lab Results  Component Value Date   HGBA1C 6.0 04/18/2015   Lab Results  Component Value Date   MICROALBUR <0.7 11/01/2014               Recurrent ventral hernia    Patient plans to have surgery in a few months after weight loss is achieved.       Abdominal pain    Recurrent ,  With ventral hernia and transient bowel  incarceration suspected.  Complicated surgical repair advised which will include revision and replacement of prior mesh .       Other Visit Diagnoses    Need for  prophylactic vaccination and inoculation against influenza    -  Primary    Relevant Orders    Flu Vaccine QUAD 36+ mos IM (Completed)      A total of 25 minutes of face to face time was spent with patient more than half of which was spent in counselling about the above mentioned conditions  and coordination of care  I am having Ms. Fowles start on phentermine. I am also having her maintain her potassium chloride SA, Blood Pressure, and hydrochlorothiazide.  Meds ordered this encounter  Medications  . phentermine (ADIPEX-P) 37.5 MG tablet    Sig: 1/2 tablet in the am and early afternoon    Dispense:  30 tablet    Refill:  2    There are no discontinued medications.  Follow-up: Return in about 3 months (around 07/20/2015).   [name], MD

## 2015-04-20 NOTE — Assessment & Plan Note (Signed)
Patient plans to have surgery in a few months after weight loss is achieved.

## 2015-04-22 ENCOUNTER — Encounter: Payer: Self-pay | Admitting: Internal Medicine

## 2015-04-22 NOTE — Addendum Note (Signed)
Addended by: [name] on: 04/22/2015 09:05 PM   Modules accepted: Orders

## 2015-04-26 ENCOUNTER — Ambulatory Visit: Payer: PRIVATE HEALTH INSURANCE | Admitting: Surgical

## 2015-04-26 VITALS — BP 116/80 | HR 72

## 2015-04-26 DIAGNOSIS — I10 Essential (primary) hypertension: Secondary | ICD-10-CM

## 2015-04-26 NOTE — Progress Notes (Signed)
Patient came in today for a BP check. Patient BP was 116/80 and pulse rate was 72.

## 2015-05-07 NOTE — Telephone Encounter (Signed)
Mailed unread message to patient.  

## 2015-07-20 ENCOUNTER — Ambulatory Visit: Payer: PRIVATE HEALTH INSURANCE | Admitting: Internal Medicine

## 2015-07-30 ENCOUNTER — Other Ambulatory Visit: Payer: Self-pay | Admitting: Obstetrics and Gynecology

## 2015-07-30 DIAGNOSIS — Z419 Encounter for procedure for purposes other than remedying health state, unspecified: Secondary | ICD-10-CM

## 2015-07-31 MED ORDER — DOXYCYCLINE HYCLATE 100 MG IV SOLR
100.0000 mg | Freq: Once | INTRAVENOUS | Status: AC
  Administered 2015-08-01: 100 mg via INTRAVENOUS
  Filled 2015-07-31: qty 100

## 2015-07-31 MED ORDER — DOXYCYCLINE HYCLATE 100 MG IV SOLR
100.0000 mg | Freq: Two times a day (BID) | INTRAVENOUS | Status: DC
  Filled 2015-07-31 (×4): qty 100

## 2015-08-01 ENCOUNTER — Ambulatory Visit (HOSPITAL_COMMUNITY): Payer: Managed Care, Other (non HMO) | Admitting: Anesthesiology

## 2015-08-01 ENCOUNTER — Ambulatory Visit (HOSPITAL_COMMUNITY): Payer: Managed Care, Other (non HMO)

## 2015-08-01 ENCOUNTER — Encounter (HOSPITAL_COMMUNITY): Payer: Self-pay

## 2015-08-01 ENCOUNTER — Encounter (HOSPITAL_COMMUNITY)
Admission: RE | Disposition: A | Discharge status: Home / Self Care | Payer: Self-pay | Source: Ambulatory Visit | Attending: Obstetrics and Gynecology

## 2015-08-01 ENCOUNTER — Ambulatory Visit (HOSPITAL_COMMUNITY)
Admission: RE | Admit: 2015-08-01 | Discharge: 2015-08-01 | Disposition: A | Discharge status: Home / Self Care | Payer: Managed Care, Other (non HMO) | Source: Ambulatory Visit | Attending: Obstetrics and Gynecology | Admitting: Obstetrics and Gynecology

## 2015-08-01 DIAGNOSIS — D251 Intramural leiomyoma of uterus: Secondary | ICD-10-CM | POA: Insufficient documentation

## 2015-08-01 DIAGNOSIS — D250 Submucous leiomyoma of uterus: Secondary | ICD-10-CM | POA: Diagnosis not present

## 2015-08-01 DIAGNOSIS — O020 Blighted ovum and nonhydatidiform mole: Secondary | ICD-10-CM | POA: Diagnosis not present

## 2015-08-01 DIAGNOSIS — O029 Abnormal product of conception, unspecified: Secondary | ICD-10-CM

## 2015-08-01 DIAGNOSIS — O021 Missed abortion: Secondary | ICD-10-CM | POA: Diagnosis present

## 2015-08-01 HISTORY — PX: OPERATIVE ULTRASOUND: SHX5996

## 2015-08-01 HISTORY — PX: DILATION AND EVACUATION: SHX1459

## 2015-08-01 LAB — BASIC METABOLIC PANEL
ANION GAP: 7 (ref 5–15)
BUN: 9 mg/dL (ref 6–20)
CALCIUM: 9.2 mg/dL (ref 8.9–10.3)
CO2: 25 mmol/L (ref 22–32)
CREATININE: 0.86 mg/dL (ref 0.44–1.00)
Chloride: 105 mmol/L (ref 101–111)
Glucose, Bld: 108 mg/dL — ABNORMAL HIGH (ref 65–99)
Potassium: 3.8 mmol/L (ref 3.5–5.1)
SODIUM: 137 mmol/L (ref 135–145)

## 2015-08-01 LAB — CBC
HCT: 32.3 % — ABNORMAL LOW (ref 36.0–46.0)
Hemoglobin: 10 g/dL — ABNORMAL LOW (ref 12.0–15.0)
MCH: 24.8 pg — ABNORMAL LOW (ref 26.0–34.0)
MCHC: 31 g/dL (ref 30.0–36.0)
MCV: 80.1 fL (ref 78.0–100.0)
Platelets: 344 10*3/uL (ref 150–400)
RBC: 4.03 MIL/uL (ref 3.87–5.11)
RDW: 17.8 % — AB (ref 11.5–15.5)
WBC: 9.1 10*3/uL (ref 4.0–10.5)

## 2015-08-01 SURGERY — DILATION AND EVACUATION, UTERUS
Anesthesia: General

## 2015-08-01 MED ORDER — OXYCODONE HCL 5 MG/5ML PO SOLN: 5.0000 mg | Freq: Once | ORAL | Status: DC | PRN

## 2015-08-01 MED ORDER — DEXAMETHASONE SODIUM PHOSPHATE 4 MG/ML IJ SOLN
INTRAMUSCULAR | Status: AC
  Filled 2015-08-01: qty 1

## 2015-08-01 MED ORDER — SUCCINYLCHOLINE CHLORIDE 20 MG/ML IJ SOLN
INTRAMUSCULAR | Status: DC | PRN
  Administered 2015-08-01: 120 mg via INTRAVENOUS

## 2015-08-01 MED ORDER — SUCCINYLCHOLINE CHLORIDE 20 MG/ML IJ SOLN
INTRAMUSCULAR | Status: AC
Start: 2015-08-01 — End: 2015-08-01
  Filled 2015-08-01: qty 1

## 2015-08-01 MED ORDER — PROPOFOL 10 MG/ML IV BOLUS
INTRAVENOUS | Status: AC
  Filled 2015-08-01: qty 20

## 2015-08-01 MED ORDER — LIDOCAINE HCL (CARDIAC) 20 MG/ML IV SOLN
INTRAVENOUS | Status: AC
  Filled 2015-08-01: qty 5

## 2015-08-01 MED ORDER — PROPOFOL 10 MG/ML IV BOLUS
INTRAVENOUS | Status: DC | PRN
  Administered 2015-08-01: 200 ug/kg/min via INTRAVENOUS

## 2015-08-01 MED ORDER — ROCURONIUM BROMIDE 100 MG/10ML IV SOLN
INTRAVENOUS | Status: AC
  Filled 2015-08-01: qty 1

## 2015-08-01 MED ORDER — IBUPROFEN 800 MG PO TABS: 800.0000 mg | ORAL_TABLET | Freq: Three times a day (TID) | ORAL | Status: DC | PRN

## 2015-08-01 MED ORDER — CHLOROPROCAINE HCL 1 % IJ SOLN
INTRAMUSCULAR | Status: DC | PRN
  Administered 2015-08-01: 20 mL

## 2015-08-01 MED ORDER — SCOPOLAMINE 1 MG/3DAYS TD PT72
1.0000 | MEDICATED_PATCH | Freq: Once | TRANSDERMAL | Status: DC
  Administered 2015-08-01: 1.5 mg via TRANSDERMAL

## 2015-08-01 MED ORDER — FENTANYL CITRATE (PF) 250 MCG/5ML IJ SOLN
INTRAMUSCULAR | Status: AC
  Filled 2015-08-01: qty 5

## 2015-08-01 MED ORDER — CHLOROPROCAINE HCL 1 % IJ SOLN
INTRAMUSCULAR | Status: AC
  Filled 2015-08-01: qty 30

## 2015-08-01 MED ORDER — LACTATED RINGERS IV SOLN
INTRAVENOUS | Status: DC
  Administered 2015-08-01: 11:00:00 via INTRAVENOUS
  Administered 2015-08-01: 125 mL/h via INTRAVENOUS

## 2015-08-01 MED ORDER — MIDAZOLAM HCL 2 MG/2ML IJ SOLN
INTRAMUSCULAR | Status: AC
Start: 2015-08-01 — End: 2015-08-01
  Filled 2015-08-01: qty 2

## 2015-08-01 MED ORDER — MIDAZOLAM HCL 2 MG/2ML IJ SOLN
INTRAMUSCULAR | Status: DC | PRN
  Administered 2015-08-01: 1 mg via INTRAVENOUS

## 2015-08-01 MED ORDER — BUPIVACAINE HCL (PF) 0.25 % IJ SOLN
INTRAMUSCULAR | Status: AC
  Filled 2015-08-01: qty 30

## 2015-08-01 MED ORDER — SCOPOLAMINE 1 MG/3DAYS TD PT72
MEDICATED_PATCH | TRANSDERMAL | Status: AC
  Administered 2015-08-01: 1.5 mg via TRANSDERMAL
  Filled 2015-08-01: qty 1

## 2015-08-01 MED ORDER — OXYCODONE HCL 5 MG PO TABS: 5.0000 mg | ORAL_TABLET | Freq: Once | ORAL | Status: DC | PRN

## 2015-08-01 MED ORDER — ONDANSETRON HCL 4 MG/2ML IJ SOLN
INTRAMUSCULAR | Status: DC | PRN
  Administered 2015-08-01: 4 mg via INTRAVENOUS

## 2015-08-01 MED ORDER — HEPARIN SODIUM (PORCINE) 5000 UNIT/ML IJ SOLN
INTRAMUSCULAR | Status: AC
  Filled 2015-08-01: qty 1

## 2015-08-01 MED ORDER — FENTANYL CITRATE (PF) 100 MCG/2ML IJ SOLN: 25.0000 ug | INTRAMUSCULAR | Status: DC | PRN

## 2015-08-01 MED ORDER — ONDANSETRON HCL 4 MG/2ML IJ SOLN
INTRAMUSCULAR | Status: AC
  Filled 2015-08-01: qty 2

## 2015-08-01 MED ORDER — FENTANYL CITRATE (PF) 100 MCG/2ML IJ SOLN
INTRAMUSCULAR | Status: DC | PRN
  Administered 2015-08-01: 25 ug via INTRAVENOUS
  Administered 2015-08-01: 50 ug via INTRAVENOUS
  Administered 2015-08-01: 25 ug via INTRAVENOUS
  Administered 2015-08-01: 50 ug via INTRAVENOUS

## 2015-08-01 MED ORDER — ONDANSETRON HCL 4 MG/2ML IJ SOLN: 4.0000 mg | Freq: Four times a day (QID) | INTRAMUSCULAR | Status: DC | PRN

## 2015-08-01 MED ORDER — KETOROLAC TROMETHAMINE 30 MG/ML IJ SOLN
INTRAMUSCULAR | Status: AC
  Filled 2015-08-01: qty 1

## 2015-08-01 MED ORDER — KETOROLAC TROMETHAMINE 30 MG/ML IJ SOLN
INTRAMUSCULAR | Status: DC | PRN
  Administered 2015-08-01: 30 mg via INTRAVENOUS

## 2015-08-01 MED ORDER — DEXAMETHASONE SODIUM PHOSPHATE 10 MG/ML IJ SOLN
INTRAMUSCULAR | Status: DC | PRN
  Administered 2015-08-01: 4 mg via INTRAVENOUS

## 2015-08-01 SURGICAL SUPPLY — 40 items
APPLICATOR COTTON TIP 6IN STRL (MISCELLANEOUS) IMPLANT
CABLE HIGH FREQUENCY MONO STRZ (ELECTRODE) IMPLANT
CATH ROBINSON RED A/P 16FR (CATHETERS) ×3 IMPLANT
CLOTH BEACON ORANGE TIMEOUT ST (SAFETY) ×3 IMPLANT
DECANTER SPIKE VIAL GLASS SM (MISCELLANEOUS) ×3 IMPLANT
DRSG COVADERM PLUS 2X2 (GAUZE/BANDAGES/DRESSINGS) ×6 IMPLANT
DRSG OPSITE POSTOP 3X4 (GAUZE/BANDAGES/DRESSINGS) IMPLANT
ELECT LIGASURE LONG (ELECTRODE) IMPLANT
FORCEPS CUTTING 33CM 5MM (CUTTING FORCEPS) IMPLANT
FORCEPS CUTTING 45CM 5MM (CUTTING FORCEPS) IMPLANT
GLOVE BIOGEL PI IND STRL 7.0 (GLOVE) ×3 IMPLANT
GLOVE BIOGEL PI INDICATOR 7.0 (GLOVE) ×6
GLOVE ECLIPSE 6.5 STRL STRAW (GLOVE) ×3 IMPLANT
GOWN STRL REUS W/TWL LRG LVL3 (GOWN DISPOSABLE) ×9 IMPLANT
KIT BERKELEY 1ST TRIMESTER 3/8 (MISCELLANEOUS) ×3 IMPLANT
LIQUID BAND (GAUZE/BANDAGES/DRESSINGS) IMPLANT
NEEDLE INSUFFLATION 120MM (ENDOMECHANICALS) IMPLANT
NS IRRIG 1000ML POUR BTL (IV SOLUTION) ×3 IMPLANT
PACK LAPAROSCOPY BASIN (CUSTOM PROCEDURE TRAY) ×3 IMPLANT
PACK VAGINAL MINOR WOMEN LF (CUSTOM PROCEDURE TRAY) ×3 IMPLANT
PAD OB MATERNITY 4.3X12.25 (PERSONAL CARE ITEMS) ×3 IMPLANT
PAD PREP 24X48 CUFFED NSTRL (MISCELLANEOUS) ×3 IMPLANT
PAD TRENDELENBURG POSITION (MISCELLANEOUS) ×3 IMPLANT
SCISSORS LAP 5X35 DISP (ENDOMECHANICALS) IMPLANT
SET BERKELEY SUCTION TUBING (SUCTIONS) ×3 IMPLANT
SET IRRIG TUBING LAPAROSCOPIC (IRRIGATION / IRRIGATOR) IMPLANT
SLEEVE XCEL OPT CAN 5 100 (ENDOMECHANICALS) IMPLANT
SOLUTION ELECTROLUBE (MISCELLANEOUS) IMPLANT
SUT VICRYL 0 UR6 27IN ABS (SUTURE) ×3 IMPLANT
SUT VICRYL 4-0 PS2 18IN ABS (SUTURE) ×3 IMPLANT
TOWEL OR 17X24 6PK STRL BLUE (TOWEL DISPOSABLE) ×6 IMPLANT
TROCAR BALLN 12MMX100 BLUNT (TROCAR) IMPLANT
TROCAR OPTI TIP 5M 100M (ENDOMECHANICALS) IMPLANT
TROCAR XCEL DIL TIP R 11M (ENDOMECHANICALS) IMPLANT
VACURETTE 10 RIGID CVD (CANNULA) IMPLANT
VACURETTE 7MM CVD STRL WRAP (CANNULA) ×3 IMPLANT
VACURETTE 8 RIGID CVD (CANNULA) ×3 IMPLANT
VACURETTE 9 RIGID CVD (CANNULA) IMPLANT
WARMER LAPAROSCOPE (MISCELLANEOUS) ×3 IMPLANT
WATER STERILE IRR 1000ML POUR (IV SOLUTION) ×3 IMPLANT

## 2015-08-01 NOTE — Anesthesia Preprocedure Evaluation (Signed)
Anesthesia Evaluation  Patient identified by MRN, date of birth, ID band Patient awake    Reviewed: Allergy & Precautions, NPO status , Patient's Chart, lab work & pertinent test results  Airway Mallampati: II   Neck ROM: full    Dental   Pulmonary    breath sounds clear to auscultation       Cardiovascular hypertension,  Rhythm:regular Rate:Normal     Neuro/Psych  Headaches,    GI/Hepatic   Endo/Other  diabetes, Type 2Morbid obesity  Renal/GU      Musculoskeletal   Abdominal   Peds  Hematology   Anesthesia Other Findings   Reproductive/Obstetrics                             Anesthesia Physical Anesthesia Plan  ASA: II  Anesthesia Plan: General   Post-op Pain Management:    Induction: Intravenous  Airway Management Planned: Oral ETT  Additional Equipment:   Intra-op Plan:   Post-operative Plan: Extubation in OR  Informed Consent: I have reviewed the patients History and Physical, chart, labs and discussed the procedure including the risks, benefits and alternatives for the proposed anesthesia with the patient or authorized representative who has indicated his/her understanding and acceptance.     Plan Discussed with: CRNA, Anesthesiologist and Surgeon  Anesthesia Plan Comments:         Anesthesia Quick Evaluation

## 2015-08-01 NOTE — H&P (Signed)
Nicole Burns is an 40 y.o. female [phone] presents for u/s guided suction D&E , possible diagnostic laparoscopy due to missed abortion poss right cornual pregnancy. Pt denies any pain. She has had some brown vaginal spotting. Office sonogram showed IUP without fetal heart rate however due to central fibroid unable to assess cornual status. Pt had sonohysterogram which had not shown a central mass shown on CT scan   Pertinent Gynecological History: Menses: flow is moderate Bleeding: reg Contraception: none DES exposure: denies Blood transfusions: none Sexually transmitted diseases: no past history Previous GYN Procedures: c/s x 2, laparoscopic hernia repair with mesh  Last mammogram: normal Date: 2015 Last pap: normal Date: 2016 OB History: G4, P2012   Menstrual History: Menarche age: n/a No LMP recorded.    Past Medical History  Diagnosis Date  . History of chicken pox   . Hypertension   . Migraine     After menses cycle.   Marland Kitchen Umbilical hernia [id number]  . Gallbladder polyp   . Hepatic cyst   . Fatty liver     Past Surgical History  Procedure Laterality Date  . Cesarean section      1993 & 2003  . Hernia repair  2007    Family History  Problem Relation Age of Onset  . Hypertension Mother   . Hyperlipidemia Father   . Heart disease Father   . Stroke Father   . Hypertension Father   . Diabetes Father   . Hyperlipidemia Maternal Grandmother   . Cancer Maternal Grandmother     Breast  . Kidney disease Paternal Grandmother   . Diabetes Paternal Grandmother     Social History:  reports that she has never smoked. She does not have any smokeless tobacco history on file. She reports that she does not drink alcohol or use illicit drugs.  Allergies: Lisinopril Vicodin No latex allergy  No prescriptions prior to admission    Review of Systems  All other systems reviewed and are negative.   There were no vitals taken for this visit. Physical Exam   Constitutional: She is oriented to person, place, and time. She appears well-developed and well-nourished.  HENT:  Head: Atraumatic.  Eyes: EOM are normal.  Neck: Neck supple.  Cardiovascular: Regular rhythm.   Respiratory: Effort normal.  GI: Soft.  Genitourinary: Vagina normal.  Neurological: She is alert and oriented to person, place, and time.  Skin: Skin is warm and dry.  Psychiatric: She has a normal mood and affect.    No results found for this or any previous visit (from the past 24 hour(s)).  No results found.  Assessment/Plan: Missed Abortion ? Cornual pregnancy Fibroid uterus P) u/s guided suction D&E poss dx laparoscopy. Risk of surgery reviewed including infection, bleeding, Injury to underlying and surrounding organ, internal scar tissue, poss need for blood transfusion and its risk  Conception Doebler A 08/01/2015, 6:37 AM

## 2015-08-01 NOTE — Brief Op Note (Signed)
08/01/2015  11:09 AM  PATIENT:  Nicole Burns  40 y.o. female  PRE-OPERATIVE DIAGNOSIS:  Recurrent Pregnancy Loss, Blighted Ovum  POST-OPERATIVE DIAGNOSIS:  Recurrent Pregnancy Loss, Blighted Ovum, IM/SM fibroid  PROCEDURE:  Ultrasound guided suction dilation and evaucation with chromosomal analysis  SURGEON:  Surgeon(s) and Role:    * Zissel Biederman, MD - Primary  PHYSICIAN ASSISTANT:   ASSISTANTS: none   ANESTHESIA:   general and paracervical block Findings: empty intrauterine gestational sac, SM fibroid( fundal), post IM fibroid EBL:  Total I/O In: 1000 [I.V.:1000] Out: 50 [Blood:50]  BLOOD ADMINISTERED:none  DRAINS: none   LOCAL MEDICATIONS USED:  OTHER nesicaine  SPECIMEN:  Source of Specimen:  POC, chromosomal analysis  DISPOSITION OF SPECIMEN:  PATHOLOGY  COUNTS:  YES  TOURNIQUET:  * No tourniquets in log *  DICTATION: .Other Dictation: Dictation Number [id number]  PLAN OF CARE: Discharge to home after PACU  PATIENT DISPOSITION:  PACU - hemodynamically stable.   Delay start of Pharmacological VTE agent (>24hrs) due to surgical blood loss or risk of bleeding: no

## 2015-08-01 NOTE — Anesthesia Postprocedure Evaluation (Signed)
Anesthesia Post Note  Patient: Nicole Burns  Procedure(s) Performed: Procedure(s) (LRB): DILATATION AND EVACUATION With Tissue Sent For Chromosomal Analysis (N/A) OPERATIVE ULTRASOUND (N/A)  Patient location during evaluation: PACU Anesthesia Type: General Level of consciousness: awake and alert Pain management: pain level controlled Vital Signs Assessment: post-procedure vital signs reviewed and stable Respiratory status: spontaneous breathing, nonlabored ventilation, respiratory function stable and patient connected to nasal cannula oxygen Cardiovascular status: blood pressure returned to baseline and stable Postop Assessment: no signs of nausea or vomiting Anesthetic complications: no    Last Vitals:  Filed Vitals:   08/01/15 1145 08/01/15 1200  BP: 123/82 129/75  Pulse: 67 67  Temp:    Resp: 15 14    Last Pain: There were no vitals filed for this visit.               Joshlynn Alfonzo

## 2015-08-01 NOTE — Discharge Instructions (Signed)

## 2015-08-01 NOTE — Op Note (Signed)
NAMECIARAN, IMDIEKE            ACCOUNT NO.:  [id number]  MEDICAL RECORD NO.:  [id number]  LOCATION:  [facility]                          FACILITY:  [facility]  PHYSICIAN:  Servando Salina, M.D.DATE OF BIRTH:  1976/03/08  DATE OF PROCEDURE:  08/01/2015 DATE OF DISCHARGE:  08/01/2015                              OPERATIVE REPORT   PREOPERATIVE DIAGNOSES:  Recurrent pregnancy loss, blighted ovum.  PROCEDURE:  Ultrasound-guided suction, dilation, evacuation with chromosomal analysis.  POSTOPERATIVE DIAGNOSES:  Recurrent pregnancy loss, blighted ovum, intramural/submucosal fibroids.  ANESTHESIA:  General, paracervical block.  SURGEON:  Servando Salina, MD.  ASSISTANT:  None.  DESCRIPTION OF PROCEDURE:  Under adequate general anesthesia, the patient was placed in the dorsal lithotomy position.  She was sterilely prepped and draped in usual fashion.  The bladder was not catheterized due to planned ultrasound-guidance.  Bivalve speculum was placed in vagina.  Single-tooth tenaculum was placed on anterior lip of the cervix.  A 20 mL of 1% Nesacaine was injected paracervically at the 3 and 9 o'clock position.  The cervix was then easily dilated up to #27 [facility] dilator.  Using ultrasound guidance, identification of the intracavitary pregnancy along with the intramural fibroid was done. A #7 and ultimately a #8 mm curved suction cannula was introduced into the uterine cavity which was then suctioned and then curetted and suctioned. The cavity has evidence of fundal submucosal fibroid.  Cavity was curetted gently.  Transvaginal ultrasound was then performed which showed no retained tissue.  At that point, procedure was felt to be completed.  Bladder was catheterized for moderate amount of urine.  The intraoperative fluid was 1 L.  Urine output was 50 ml  Blood loss was about 50.  Sponge and instrument counts x2 were correct. Specimen was products of conception with small portion of  chromosomal analysis sent to Pathology.  The patient tolerated procedure well, was transferred to recovery room in stable condition.     Servando Salina, M.D.     [place]/MEDQ  D:  08/01/2015  T:  08/01/2015  Job:  [id number]

## 2015-08-01 NOTE — Transfer of Care (Signed)
Immediate Anesthesia Transfer of Care Note  Patient: Nicole Burns  Procedure(s) Performed: Procedure(s): DILATATION AND EVACUATION With Tissue Sent For Chromosomal Analysis (N/A) OPERATIVE ULTRASOUND (N/A)  Patient Location: PACU  Anesthesia Type:General  Level of Consciousness: awake, alert , oriented and patient cooperative  Airway & Oxygen Therapy: Patient Spontanous Breathing and Patient connected to face mask oxygen  Post-op Assessment: Report given to RN and Post -op Vital signs reviewed and stable  Post vital signs: Reviewed and stable  Last Vitals:  Filed Vitals:   08/01/15 0904  BP: 151/93  Pulse: 77  Temp: 36.8 C  Resp: 16    Complications: No apparent anesthesia complications

## 2015-08-01 NOTE — Anesthesia Procedure Notes (Signed)
Procedure Name: Intubation Date/Time: 08/01/2015 10:07 AM Performed by: Georgeanne Nim Pre-anesthesia Checklist: Patient identified, Emergency Drugs available, Suction available, Timeout performed and Patient being monitored Patient Re-evaluated:Patient Re-evaluated prior to inductionOxygen Delivery Method: Circle system utilized Preoxygenation: Pre-oxygenation with 100% oxygen Intubation Type: IV induction Laryngoscope Size: Mac and 3 Grade View: Grade I Number of attempts: 1 Airway Equipment and Method: Stylet Placement Confirmation: ETT inserted through vocal cords under direct vision,  breath sounds checked- equal and bilateral,  positive ETCO2 and CO2 detector Secured at: 21 cm Tube secured with: Tape Dental Injury: Teeth and Oropharynx as per pre-operative assessment

## 2015-08-02 ENCOUNTER — Encounter (HOSPITAL_COMMUNITY): Payer: Self-pay | Admitting: Obstetrics and Gynecology

## 2015-08-07 ENCOUNTER — Ambulatory Visit (INDEPENDENT_AMBULATORY_CARE_PROVIDER_SITE_OTHER): Payer: Managed Care, Other (non HMO) | Admitting: Internal Medicine

## 2015-08-07 ENCOUNTER — Encounter: Payer: Self-pay | Admitting: Internal Medicine

## 2015-08-07 VITALS — BP 148/90 | HR 95 | Temp 98.0°F | Resp 12 | Ht 63.0 in | Wt 255.5 lb

## 2015-08-07 DIAGNOSIS — O021 Missed abortion: Secondary | ICD-10-CM | POA: Diagnosis not present

## 2015-08-07 DIAGNOSIS — D509 Iron deficiency anemia, unspecified: Secondary | ICD-10-CM | POA: Diagnosis not present

## 2015-08-07 DIAGNOSIS — I10 Essential (primary) hypertension: Secondary | ICD-10-CM | POA: Diagnosis not present

## 2015-08-07 NOTE — Patient Instructions (Signed)
I am so sorry for your loss.  Please LET ME KNOW IF I CAN HELP  You can resume the phentermine if you want to,  Just use a condom for contraception until you decide what you want to do about trying again

## 2015-08-07 NOTE — Progress Notes (Signed)
Pre-visit discussion using our clinic review tool. No additional management support is needed unless otherwise documented below in the visit note.  

## 2015-08-07 NOTE — Progress Notes (Signed)
 Subjective:  Patient ID: Nicole Burns, female    DOB: 04/27/1976  Age: 39 y.o. MRN: [id number]  CC: The primary encounter diagnosis was Iron deficiency anemia. Diagnoses of Morbid obesity, unspecified obesity type (HCC), Essential hypertension, benign, and Abortion, missed were also pertinent to this visit.  HPI Nicole Burns presents for follow up on missed abortion,ypertension, morbid obesity , hypertension and obesity  She ws last seen in September and started on phentermine for help with weight loss.  She stopped the medication after two weeks when she developed symptoms concerning for pregnancy despite being told that she was menopausal.  She was admitted to [facility] on Oct 11th for missed abortion vs cornual pregnancy and underwent D&E by Dr Cousins.    Outpatient Prescriptions Prior to Visit  Medication Sig Dispense Refill  . Blood Pressure KIT Use daily to check blood pressure 1 each 0  . hydrochlorothiazide (HYDRODIURIL) 25 MG tablet Take 1 tablet (25 mg total) by mouth daily. 90 tablet 3  . ibuprofen (ADVIL,MOTRIN) 800 MG tablet Take 1 tablet (800 mg total) by mouth every 8 (eight) hours as needed. 30 tablet 0  . potassium chloride SA (K-DUR,KLOR-CON) 20 MEQ tablet Take 1 tablet (20 mEq total) by mouth daily. 90 tablet 3   No facility-administered medications prior to visit.    Review of Systems;  Patient denies headache, fevers, malaise, unintentional weight loss, skin rash, eye pain, sinus congestion and sinus pain, sore throat, dysphagia,  hemoptysis , cough, dyspnea, wheezing, chest pain, palpitations, orthopnea, edema, abdominal pain, nausea, melena, diarrhea, constipation, flank pain, dysuria, hematuria, urinary  Frequency, nocturia, numbness, tingling, seizures,  Focal weakness, Loss of consciousness,  Tremor, insomnia, depression, anxiety, and suicidal ideation.      Objective:  BP 148/90 mmHg  Pulse 95  Temp(Src) 98 F (36.7 C) (Oral)  Resp 12  Ht 5' 3"  (1.6 m)  Wt 255 lb 8 oz (115.894 kg)  BMI 45.27 kg/m2  SpO2 99%  LMP 07/28/2014  BP Readings from Last 3 Encounters:  08/07/15 148/90  08/01/15 143/82  04/26/15 116/80    Wt Readings from Last 3 Encounters:  08/07/15 255 lb 8 oz (115.894 kg)  08/01/15 252 lb (114.306 kg)  04/20/15 257 lb (116.574 kg)    General appearance: alert, cooperative and appears stated age Ears: normal TM's and external ear canals both ears Throat: lips, mucosa, and tongue normal; teeth and gums normal Neck: no adenopathy, no carotid bruit, supple, symmetrical, trachea midline and thyroid not enlarged, symmetric, no tenderness/mass/nodules Back: symmetric, no curvature. ROM normal. No CVA tenderness. Lungs: clear to auscultation bilaterally Heart: regular rate and rhythm, S1, S2 normal, no murmur, click, rub or gallop Abdomen: soft, non-tender; bowel sounds normal; no masses,  no organomegaly Pulses: 2+ and symmetric Skin: Skin color, texture, turgor normal. No rashes or lesions Lymph nodes: Cervical, supraclavicular, and axillary nodes normal.  Lab Results  Component Value Date   HGBA1C 6.0 04/18/2015   HGBA1C 5.9 11/01/2014   HGBA1C 6.3 08/02/2014    Lab Results  Component Value Date   CREATININE 0.86 08/01/2015   CREATININE 0.95 04/18/2015   CREATININE 0.84 03/05/2015    Lab Results  Component Value Date   WBC 9.1 08/07/2015   HGB 9.1* 08/07/2015   HCT 29.1* 08/07/2015   PLT 342.0 08/07/2015   GLUCOSE 108* 08/01/2015   CHOL 147 04/18/2015   TRIG 69.0 04/18/2015   HDL 43.40 04/18/2015   LDLCALC 90 04/18/2015   ALT 12 04/18/2015     AST 11 04/18/2015   NA 137 08/01/2015   K 3.8 08/01/2015   CL 105 08/01/2015   CREATININE 0.86 08/01/2015   BUN 9 08/01/2015   CO2 25 08/01/2015   TSH 0.98 08/07/2015   HGBA1C 6.0 04/18/2015   MICROALBUR <0.7 11/01/2014    [place] Intraoperative  08/01/2015  CLINICAL DATA:  Ultrasound was provided for use by the ordering physician, and a technical  charge was applied by the performing facility.  No radiologist interpretation/professional services rendered.    Assessment & Plan:   Problem List Items Addressed This Visit    Obesity, morbid (HCC)    She is currently celibate post D & C.,  Discussed resuming phentermine if she has decided against more children      Essential hypertension, benign    Well controlled on current regimen. Renal function stable, no changes today.  Lab Results  Component Value Date   CREATININE 0.86 08/01/2015   Lab Results  Component Value Date   NA 137 08/01/2015   K 3.8 08/01/2015   CL 105 08/01/2015   CO2 25 08/01/2015         Abortion, missed    Spent 30 minutes discussing patient's recent loss, her emotional response,  and her subsequent thoughts about trying to have another children.        Other Visit Diagnoses    Iron deficiency anemia    -  Primary    Relevant Orders    CBC with Differential/Platelet (Completed)    TSH (Completed)    Ferritin (Completed)    IBC panel (Completed)      A total of 40 minutes was spent with patient more than half of which was spent in counseling patient on the above mentioned issues , reviewing and explaining recent labs and imaging studies done, and coordination of care. I am having Ms. Ramnauth maintain her potassium chloride SA, Blood Pressure, hydrochlorothiazide, and ibuprofen.  No orders of the defined types were placed in this encounter.    There are no discontinued medications.  Follow-up: No Follow-up on file.   TULLO, TERESA L, MD  

## 2015-08-08 LAB — CBC WITH DIFFERENTIAL/PLATELET
BASOS ABS: 0 10*3/uL (ref 0.0–0.1)
BASOS PCT: 0.5 % (ref 0.0–3.0)
EOS ABS: 0.2 10*3/uL (ref 0.0–0.7)
Eosinophils Relative: 2.1 % (ref 0.0–5.0)
HEMATOCRIT: 29.1 % — AB (ref 36.0–46.0)
Hemoglobin: 9.1 g/dL — ABNORMAL LOW (ref 12.0–15.0)
LYMPHS ABS: 2.6 10*3/uL (ref 0.7–4.0)
Lymphocytes Relative: 28.7 % (ref 12.0–46.0)
MCHC: 31.2 g/dL (ref 30.0–36.0)
MCV: 79.3 fl (ref 78.0–100.0)
MONOS PCT: 11.6 % (ref 3.0–12.0)
Monocytes Absolute: 1.1 10*3/uL — ABNORMAL HIGH (ref 0.1–1.0)
NEUTROS ABS: 5.2 10*3/uL (ref 1.4–7.7)
NEUTROS PCT: 57.1 % (ref 43.0–77.0)
PLATELETS: 342 10*3/uL (ref 150.0–400.0)
RBC: 3.67 Mil/uL — ABNORMAL LOW (ref 3.87–5.11)
RDW: 18.6 % — AB (ref 11.5–15.5)
WBC: 9.1 10*3/uL (ref 4.0–10.5)

## 2015-08-08 LAB — IBC PANEL
IRON: 22 ug/dL — AB (ref 42–145)
Saturation Ratios: 5.6 % — ABNORMAL LOW (ref 20.0–50.0)
Transferrin: 279 mg/dL (ref 212.0–360.0)

## 2015-08-08 LAB — TSH: TSH: 0.98 u[IU]/mL (ref 0.35–4.50)

## 2015-08-08 LAB — FERRITIN: Ferritin: 7.5 ng/mL — ABNORMAL LOW (ref 10.0–291.0)

## 2015-08-09 DIAGNOSIS — O021 Missed abortion: Secondary | ICD-10-CM | POA: Insufficient documentation

## 2015-08-09 HISTORY — DX: Missed abortion: O02.1

## 2015-08-09 NOTE — Assessment & Plan Note (Signed)
Spent 30 minutes discussing patient's recent loss, her emotional response,  and her subsequent thoughts about trying to have another children.

## 2015-08-09 NOTE — Assessment & Plan Note (Signed)
Well controlled on current regimen. Renal function stable, no changes today.  Lab Results  Component Value Date   CREATININE 0.86 08/01/2015   Lab Results  Component Value Date   NA 137 08/01/2015   K 3.8 08/01/2015   CL 105 08/01/2015   CO2 25 08/01/2015

## 2015-08-09 NOTE — Assessment & Plan Note (Signed)
She is currently celibate post D & C.,  Discussed resuming phentermine if she has decided against more children

## 2015-08-11 ENCOUNTER — Encounter: Payer: Self-pay | Admitting: Internal Medicine

## 2015-08-20 ENCOUNTER — Encounter: Payer: Self-pay | Admitting: Internal Medicine

## 2015-08-20 LAB — CHROMOSOME STD, POC(TISSUE)-NCBH

## 2015-08-22 ENCOUNTER — Other Ambulatory Visit: Payer: Self-pay | Admitting: Internal Medicine

## 2015-08-22 MED ORDER — LABETALOL HCL 200 MG PO TABS: 200.0000 mg | ORAL_TABLET | Freq: Two times a day (BID) | ORAL | Status: DC

## 2015-08-23 ENCOUNTER — Ambulatory Visit: Payer: Self-pay | Admitting: Physician Assistant

## 2015-08-23 ENCOUNTER — Encounter: Payer: Self-pay | Admitting: Physician Assistant

## 2015-08-23 VITALS — BP 120/70 | HR 83 | Temp 98.4°F

## 2015-08-23 DIAGNOSIS — J018 Other acute sinusitis: Secondary | ICD-10-CM

## 2015-08-23 MED ORDER — FLUTICASONE PROPIONATE 50 MCG/ACT NA SUSP: 2.0000 | Freq: Every day | NASAL | Status: DC

## 2015-08-23 NOTE — Progress Notes (Signed)
S: C/o runny nose and congestion for 3 days, no fever, chills, cp/sob, v/d; mucus is yellow in the mornings but clear all day, c/o of facial pressure.   Using otc meds:   O: PE: vitals wnl, nad,  perrl eomi, normocephalic, tms dull, nasal mucosa pink and swollen with clear mucus, throat injected, neck supple no lymph, lungs c t a, cv rrr, neuro intact  A:  Acute allergic sinusitis   P: flonase, otc tylenol cold,  drink fluids, continue regular meds , use otc meds of choice, return if not improving in 5 days, return earlier if worsening , if mucus turns dark will call in an antibiotic

## 2017-02-16 ENCOUNTER — Encounter: Payer: Self-pay | Admitting: Internal Medicine

## 2017-02-16 DIAGNOSIS — Z0289 Encounter for other administrative examinations: Secondary | ICD-10-CM

## 2017-03-04 IMAGING — CT CT ABD-PELV W/ CM
2 of 5 series · 16 of 46 positions shown, 18 images · IV contrast (Omnipaque 300)
Comparison: 01/14/2013

CLINICAL DATA: Chronic intermittent abdominal pain for 1 year.
Nausea and vomiting. Diarrhea. Ventral hernia.

EXAM:
CT ABDOMEN AND PELVIS WITH CONTRAST
TECHNIQUE: Multidetector CT imaging of the abdomen and pelvis was performed
using the standard protocol following bolus administration of
intravenous contrast.
CONTRAST:  100mL OMNIPAQUE IOHEXOL 300 MG/ML  SOLN

[Series 2: abd/ pel 5mm · axial · 0.77mm/px · z∈[-352,+8]mm · 13 of 82 slices shown, 15 images]
[im 5/82  soft-tissue]
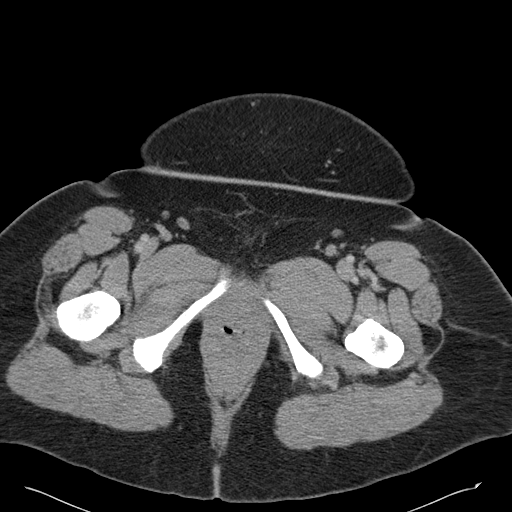
[im 5/82  bone]
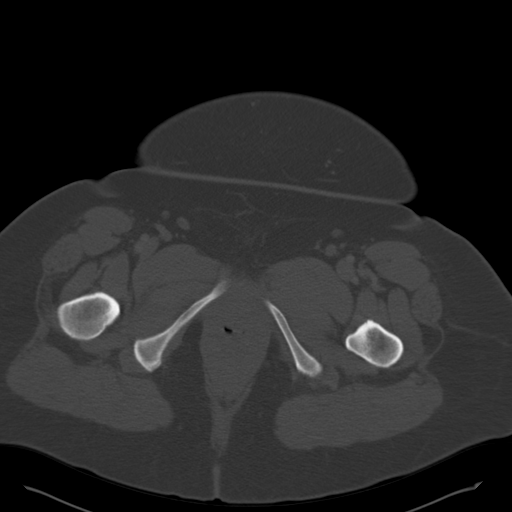
[im 13/82  soft-tissue]
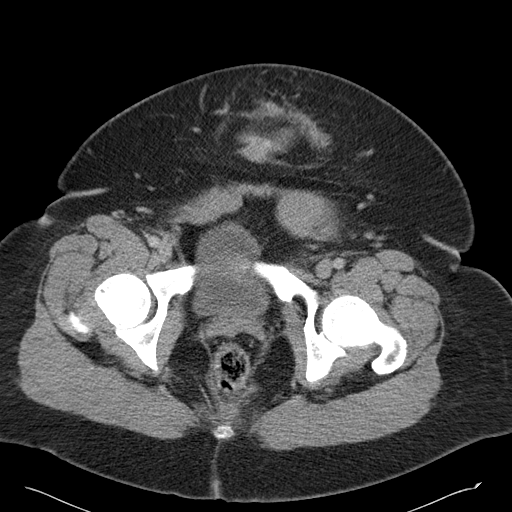
[im 17/82  soft-tissue]
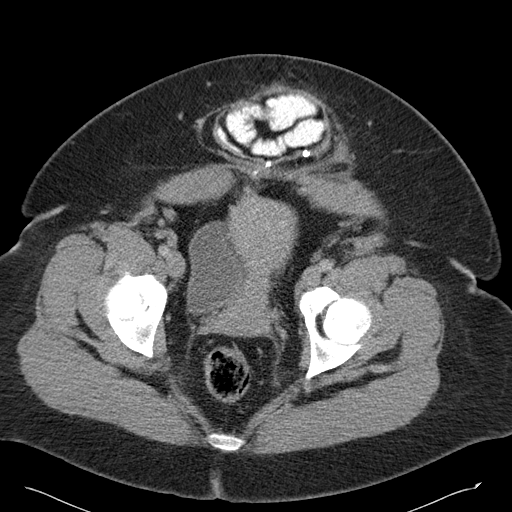
[im 25/82  soft-tissue]
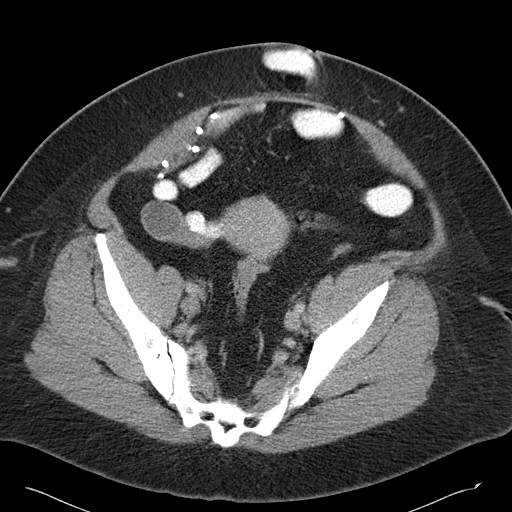
[im 29/82  soft-tissue]
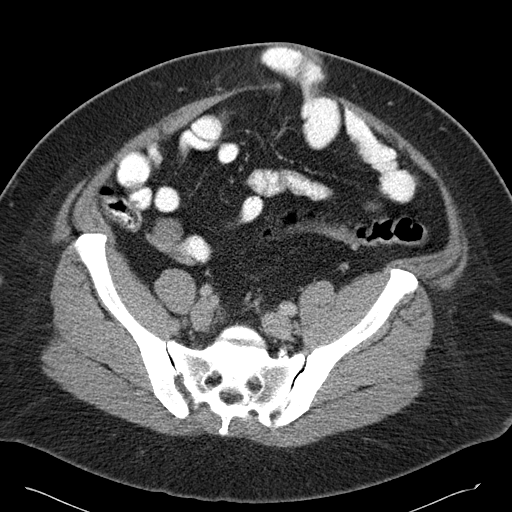
[im 37/82  soft-tissue]
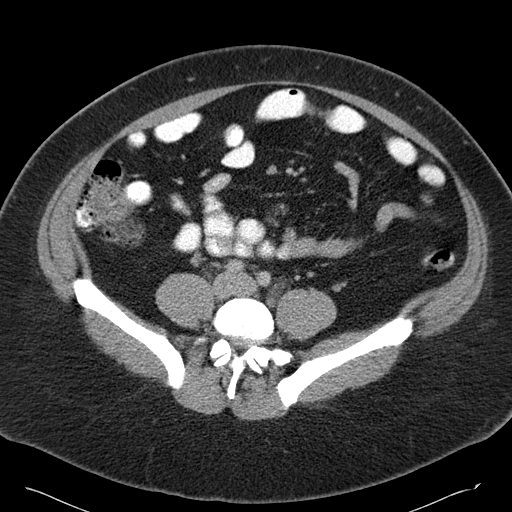
[im 41/82  soft-tissue]
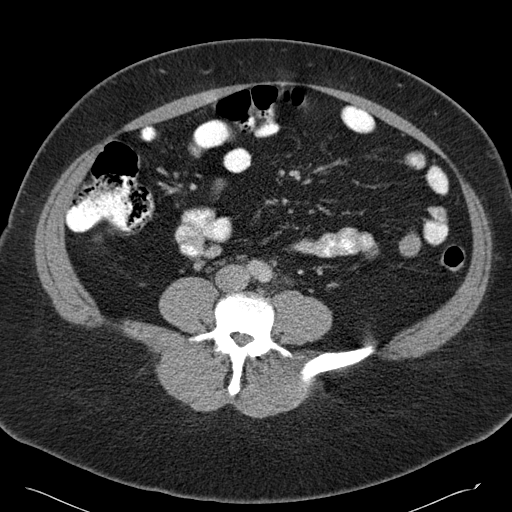
[im 45/82  soft-tissue]
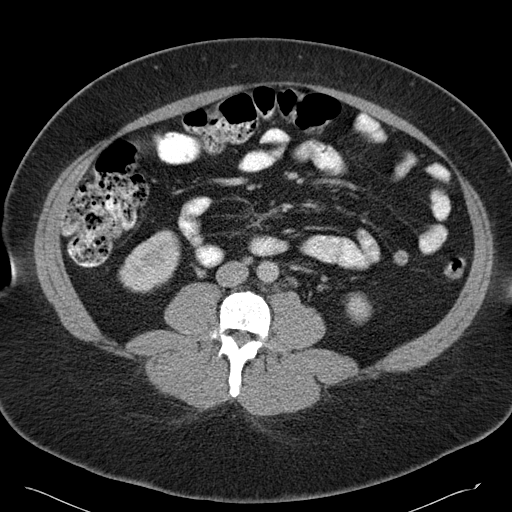
[im 53/82  soft-tissue]
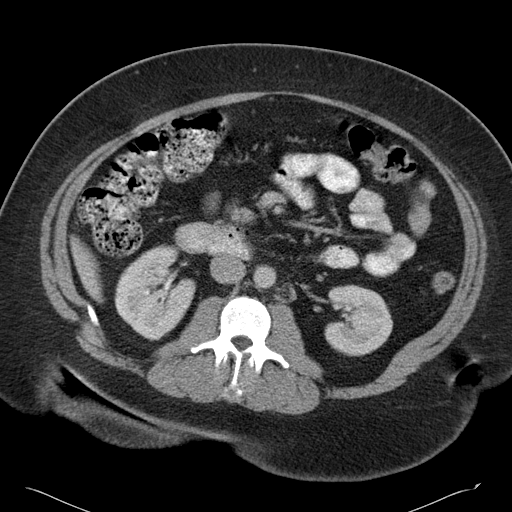
[im 53/82  bone]
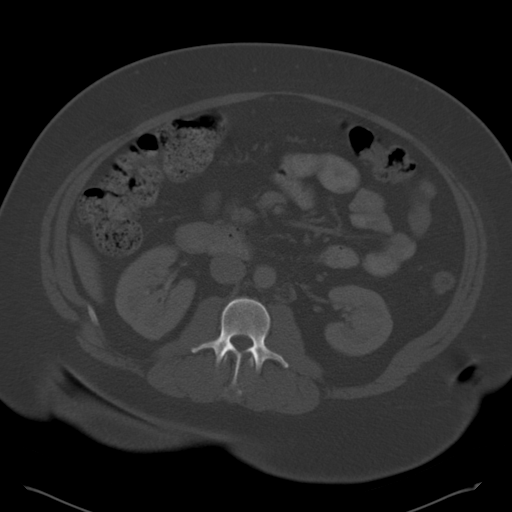
[im 57/82  soft-tissue]
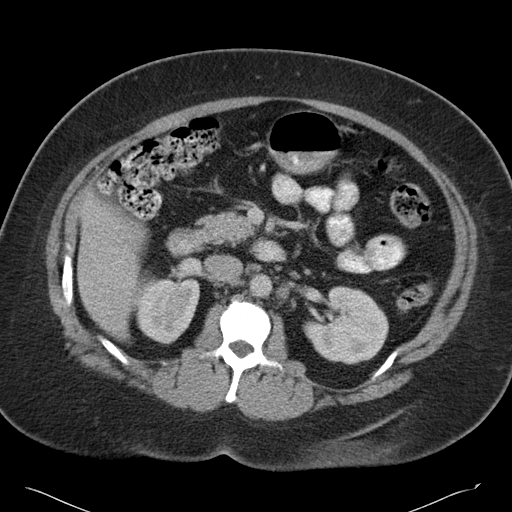
[im 65/82  soft-tissue]
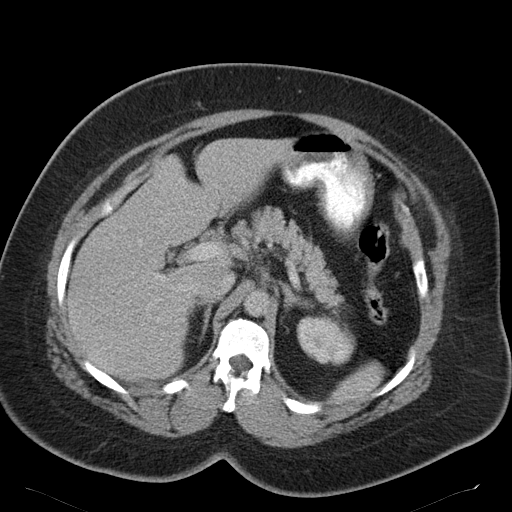
[im 69/82  soft-tissue]
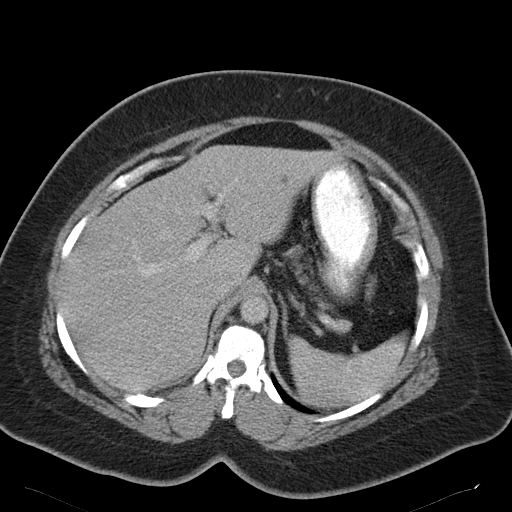
[im 77/82  soft-tissue]
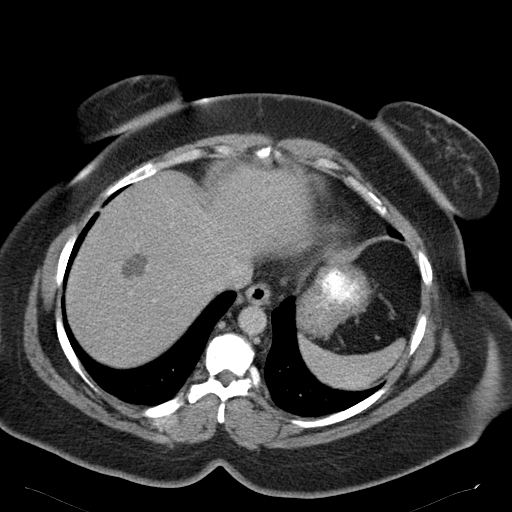

[Series 602: cor · coronal · 0.82mm/px · 3 of 153 slices shown]
[im 51/153  soft-tissue]
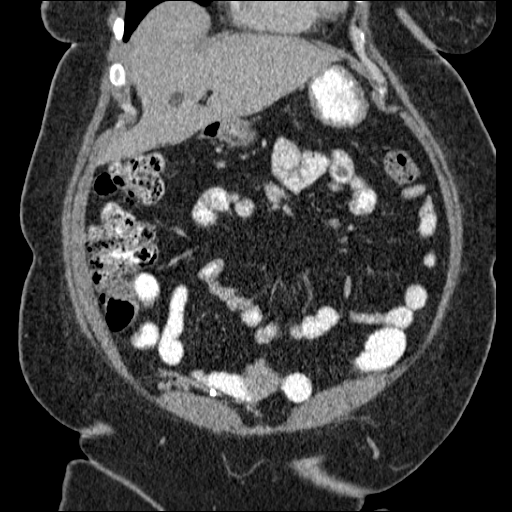
[im 68/153  soft-tissue]
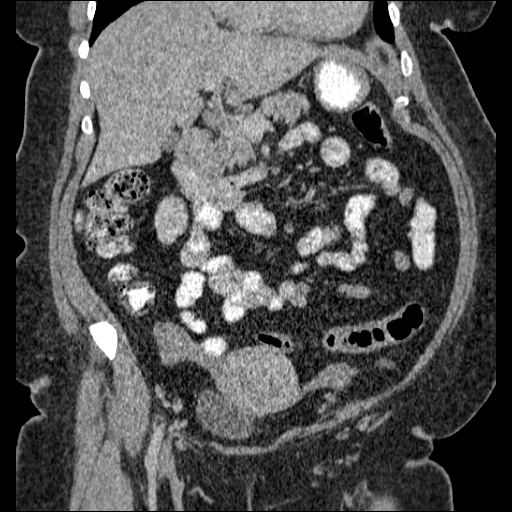
[im 85/153  soft-tissue]
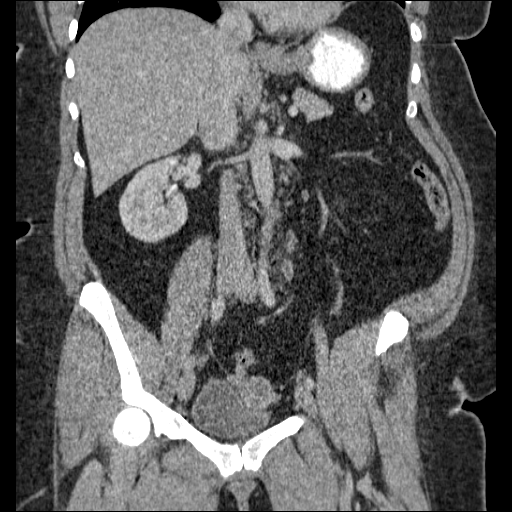

[16 of 46 positions shown; findings below may reference images not displayed]

FINDINGS: Lower Chest: No acute findings.

Hepatobiliary: Decreased hepatic steatosis noted since prior study.
Multiple small hepatic cysts appears stable. No liver masses are
identified. Gallbladder is unremarkable.

Pancreas: No mass, inflammatory changes, or other significant
abnormality identified.

Spleen:  Within normal limits in size and appearance.

Adrenals:  No masses identified.

Kidneys/Urinary Tract: No evidence of masses or hydronephrosis.
Probable tiny nonobstructive bilateral intrarenal calculi noted. A
few tiny renal cysts again noted.

Stomach/Bowel/Peritoneum: Surgical mesh again seen within the lower
anterior abdominal wall. A small paraumbilical hernia containing a
small bowel loop shows no significant change. A moderate
infraumbilical midline hernia is again noted also containing several
small bowel loops. No evidence of small bowel obstruction or
ischemia.

Vascular/Lymphatic: No pathologically enlarged lymph nodes
identified. No abdominal aortic aneurysm or other significant
retroperitoneal abnormality demonstrated.

Reproductive: 2.5 cm central uterine mass is seen which involves the
endometrial cavity. This is increased in size and could represent a
submucosal fibroid or endometrial polyp. Simple appearing right
ovarian cyst or follicle also seen measuring 2.8 cm.

Other:  None.

Musculoskeletal:  No suspicious bone lesions identified.
IMPRESSION: No acute findings identified.

Small paraumbilical and moderate infraumbilical ventral hernias,
both containing small bowel. No evidence of small bowel obstruction
or ischemia.

Increased size of 2.5 cm central uterine mass, which may represent a
submucosal fibroid or endometrial polyp. Recommend pelvic ultrasound
for further evaluation.

## 2017-04-03 ENCOUNTER — Encounter: Payer: Self-pay | Admitting: Internal Medicine

## 2017-04-15 NOTE — Telephone Encounter (Signed)
Orders

## 2017-05-15 ENCOUNTER — Encounter: Payer: Self-pay | Admitting: Internal Medicine

## 2017-05-15 ENCOUNTER — Ambulatory Visit (INDEPENDENT_AMBULATORY_CARE_PROVIDER_SITE_OTHER): Payer: Managed Care, Other (non HMO) | Admitting: Internal Medicine

## 2017-05-15 VITALS — BP 118/76 | HR 97 | Temp 98.5°F | Resp 16 | Ht 63.0 in | Wt 239.0 lb

## 2017-05-15 DIAGNOSIS — A0811 Acute gastroenteropathy due to Norwalk agent: Secondary | ICD-10-CM

## 2017-05-15 DIAGNOSIS — E1169 Type 2 diabetes mellitus with other specified complication: Secondary | ICD-10-CM

## 2017-05-15 DIAGNOSIS — R509 Fever, unspecified: Secondary | ICD-10-CM

## 2017-05-15 DIAGNOSIS — I10 Essential (primary) hypertension: Secondary | ICD-10-CM | POA: Diagnosis not present

## 2017-05-15 DIAGNOSIS — A09 Infectious gastroenteritis and colitis, unspecified: Secondary | ICD-10-CM | POA: Diagnosis not present

## 2017-05-15 DIAGNOSIS — O021 Missed abortion: Secondary | ICD-10-CM

## 2017-05-15 DIAGNOSIS — E669 Obesity, unspecified: Secondary | ICD-10-CM

## 2017-05-15 LAB — CBC WITH DIFFERENTIAL/PLATELET
BASOS PCT: 0.8 % (ref 0.0–3.0)
Basophils Absolute: 0.1 10*3/uL (ref 0.0–0.1)
EOS ABS: 0.2 10*3/uL (ref 0.0–0.7)
Eosinophils Relative: 2.1 % (ref 0.0–5.0)
HCT: 35.3 % — ABNORMAL LOW (ref 36.0–46.0)
HEMOGLOBIN: 10.4 g/dL — AB (ref 12.0–15.0)
Lymphocytes Relative: 18.3 % (ref 12.0–46.0)
Lymphs Abs: 2.1 10*3/uL (ref 0.7–4.0)
MCHC: 29.7 g/dL — ABNORMAL LOW (ref 30.0–36.0)
MCV: 71.3 fl — ABNORMAL LOW (ref 78.0–100.0)
MONO ABS: 1.3 10*3/uL — AB (ref 0.1–1.0)
Monocytes Relative: 11.3 % (ref 3.0–12.0)
NEUTROS PCT: 67.5 % (ref 43.0–77.0)
Neutro Abs: 7.6 10*3/uL (ref 1.4–7.7)
Platelets: 510 10*3/uL — ABNORMAL HIGH (ref 150.0–400.0)
RBC: 4.95 Mil/uL (ref 3.87–5.11)
RDW: 18.3 % — AB (ref 11.5–15.5)
WBC: 11.3 10*3/uL — AB (ref 4.0–10.5)

## 2017-05-15 LAB — COMPREHENSIVE METABOLIC PANEL
ALBUMIN: 4.3 g/dL (ref 3.5–5.2)
ALT: 10 U/L (ref 0–35)
AST: 12 U/L (ref 0–37)
Alkaline Phosphatase: 62 U/L (ref 39–117)
BUN: 15 mg/dL (ref 6–23)
CHLORIDE: 102 meq/L (ref 96–112)
CO2: 24 mEq/L (ref 19–32)
CREATININE: 1.02 mg/dL (ref 0.40–1.20)
Calcium: 9.9 mg/dL (ref 8.4–10.5)
GFR: 75.86 mL/min (ref >60.00)
Glucose, Bld: 130 mg/dL — ABNORMAL HIGH (ref 70–99)
Potassium: 3.9 mEq/L (ref 3.5–5.1)
SODIUM: 134 meq/L — AB (ref 135–145)
Total Bilirubin: 0.6 mg/dL (ref 0.2–1.2)
Total Protein: 8.2 g/dL (ref 6.0–8.3)

## 2017-05-15 LAB — MAGNESIUM: MAGNESIUM: 1.9 mg/dL (ref 1.5–2.5)

## 2017-05-15 MED ORDER — PROMETHAZINE HCL 12.5 MG PO TABS: 12.5000 mg | ORAL_TABLET | Freq: Three times a day (TID) | ORAL | 0 refills | Status: DC | PRN

## 2017-05-15 NOTE — Patient Instructions (Signed)

## 2017-05-15 NOTE — Progress Notes (Signed)
Subjective:  Patient ID: Nicole Burns, female    DOB: 1976-03-03  Age: 41 y.o. MRN: [id number]  CC: The primary encounter diagnosis was Diarrhea, infectious, adult. Diagnoses of Fever and chills, Diabetes mellitus type 2 in obese ([place]), Viral gastroenteritis due to [place]-like agent, Abortion, missed, and Essential hypertension, benign were also pertinent to this visit.  HPI Nicole Burns presents for annual CPE which has been rescheduled due to acute illness. She is currently presenting with signs and symptoms concerning for  VIRAL INFLUENZA VS VIRAL GASTRENTERITIS .  Marland Kitchen  Has had occupational exposure to sick people on and off over the last 2 weeks.  No family members are  sick.  Started on Tuesday with a headache,  Malaise,  By end of day nausea bloating and stomach cramping. Overnight developed cramping,  Diarrhea,  Vomiting And fevers subjective (hot cold)  . Has not had any  food intake,  Just ginger ale and sips of water. Which she has been able to hold down today without vomiting has been our of work since Wednesday and staying in bed,   Sleeping mostly  Taking pepto bismol. She is orthostatic by pulse only     Outpatient Medications Prior to Visit  Medication Sig Dispense Refill  . fluticasone (FLONASE) 50 MCG/ACT nasal spray Place 2 sprays into both nostrils daily. 16 g 0  . labetalol (NORMODYNE) 200 MG tablet Take 1 tablet (200 mg total) by mouth 2 (two) times daily. 60 tablet 5   No facility-administered medications prior to visit.     Review of Systems;  Patient denies , skin rash, eye pain, sinus congestion and sinus pain, sore throat, dysphagia,  hemoptysis , cough, dyspnea, wheezing, chest pain, palpitations, orthopnea, edema, abdominal pain, melena,  constipation, flank pain, dysuria, hematuria, urinary  Frequency, nocturia, numbness, tingling, seizures,  Focal weakness, Loss of consciousness,  Tremor, insomnia, depression, anxiety, and suicidal ideation.       Objective:  BP 118/76 (BP Location: Right Arm, Patient Position: Sitting, Cuff Size: Large)   Pulse 97   Temp 98.5 F (36.9 C) (Oral)   Resp 16   Ht 5\' 3"  (1.6 m)   Wt 239 lb (108.4 kg)   SpO2 99%   BMI 42.34 kg/m   BP Readings from Last 3 Encounters:  05/15/17 118/76  08/23/15 120/70  08/07/15 (!) 148/90    Wt Readings from Last 3 Encounters:  05/15/17 239 lb (108.4 kg)  08/07/15 255 lb 8 oz (115.9 kg)  08/01/15 252 lb (114.3 kg)    General appearance: alert, cooperative and appears stated age Ears: normal TM's and external ear canals both ears Throat: lips, mucosa, and tongue normal; teeth and gums normal Neck: no adenopathy, no carotid bruit, supple, symmetrical, trachea midline and thyroid not enlarged, symmetric, no tenderness/mass/nodules Back: symmetric, no curvature. ROM normal. No CVA tenderness. Lungs: clear to auscultation bilaterally Heart: regular rate and rhythm, S1, S2 normal, no murmur, click, rub or gallop Abdomen: soft, non-tender; bowel sounds normal; no masses,  no organomegaly Pulses: 2+ and symmetric Skin: Skin color, texture, turgor normal. No rashes or lesions Lymph nodes: Cervical, supraclavicular, and axillary nodes normal.  Lab Results  Component Value Date   HGBA1C 6.0 04/18/2015   HGBA1C 5.9 11/01/2014   HGBA1C 6.3 08/02/2014    Lab Results  Component Value Date   CREATININE 1.02 05/15/2017   CREATININE 0.86 08/01/2015   CREATININE 0.95 04/18/2015    Lab Results  Component Value Date   WBC 11.3 (  H) 05/15/2017   HGB 10.4 (L) 05/15/2017   HCT 35.3 (L) 05/15/2017   PLT 510.0 (H) 05/15/2017   GLUCOSE 130 (H) 05/15/2017   CHOL 147 04/18/2015   TRIG 69.0 04/18/2015   HDL 43.40 04/18/2015   LDLCALC 90 04/18/2015   ALT 10 05/15/2017   AST 12 05/15/2017   NA 134 (L) 05/15/2017   K 3.9 05/15/2017   CL 102 05/15/2017   CREATININE 1.02 05/15/2017   BUN 15 05/15/2017   CO2 24 05/15/2017   TSH 0.98 08/07/2015   HGBA1C 6.0  04/18/2015   MICROALBUR <0.7 11/01/2014    [place] Intraoperative  Result Date: 08/01/2015 CLINICAL DATA:  Ultrasound was provided for use by the ordering physician, and a technical charge was applied by the performing facility.  No radiologist interpretation/professional services rendered.    Assessment & Plan:   Problem List Items Addressed This Visit    Abortion, missed   Diabetes mellitus type 2 in obese [facility])    Patient has been lost to follow up on diabetes due to lack of tolerance to metformin and lack of initiative to exercise and lose weight.  She will return for fasting labs after her acute illness has resolved.    Lab Results  Component Value Date   HGBA1C 6.0 04/18/2015         Relevant Orders   Hemoglobin A1c   Comprehensive metabolic panel   Lipid panel   Microalbumin / creatinine urine ratio   Essential hypertension, benign    Taking labetalol due to recurrent attempts to conceive. Not orthostatic by BP , only by pulse ,  Due to acute illness.  No changes today       Viral gastroenteritis due to [place]-like agent    Phenergan,  Clear liquid diet recommended.  Rapid flu test was negative .  Lab Results  Component Value Date   NA 134 (L) 05/15/2017   K 3.9 05/15/2017   CL 102 05/15/2017   CO2 24 05/15/2017            Other Visit Diagnoses    Diarrhea, infectious, adult    -  Primary   Relevant Orders   Comprehensive metabolic panel (Completed)   CBC with Differential/Platelet (Completed)   Magnesium (Completed)   Fever and chills       Relevant Orders   POCT Influenza A/B      I am having Nicole Burns start on promethazine. I am also having her maintain her labetalol and fluticasone.  Meds ordered this encounter  Medications  . promethazine (PHENERGAN) 12.5 MG tablet    Sig: Take 1 tablet (12.5 mg total) by mouth every 8 (eight) hours as needed for nausea or vomiting.    Dispense:  20 tablet    Refill:  0    There are no discontinued  medications.  Follow-up: No Follow-up on file.   [name], MD

## 2017-05-17 DIAGNOSIS — A0811 Acute gastroenteropathy due to Norwalk agent: Secondary | ICD-10-CM | POA: Insufficient documentation

## 2017-05-17 LAB — POCT INFLUENZA A/B
INFLUENZA A, POC: NEGATIVE
INFLUENZA B, POC: NEGATIVE

## 2017-05-17 NOTE — Assessment & Plan Note (Signed)
Patient has been lost to follow up on diabetes due to lack of tolerance to metformin and lack of initiative to exercise and lose weight.  She will return for fasting labs after her acute illness has resolved.    Lab Results  Component Value Date   HGBA1C 6.0 04/18/2015

## 2017-05-17 NOTE — Assessment & Plan Note (Signed)
Taking labetalol due to recurrent attempts to conceive. Not orthostatic by BP , only by pulse ,  Due to acute illness.  No changes today

## 2017-05-17 NOTE — Assessment & Plan Note (Addendum)
Phenergan,  Clear liquid diet recommended.  Rapid flu test was negative .  Lab Results  Component Value Date   NA 134 (L) 05/15/2017   K 3.9 05/15/2017   CL 102 05/15/2017   CO2 24 05/15/2017

## 2017-05-18 ENCOUNTER — Other Ambulatory Visit (INDEPENDENT_AMBULATORY_CARE_PROVIDER_SITE_OTHER): Payer: Self-pay

## 2017-05-18 ENCOUNTER — Encounter: Payer: Self-pay | Admitting: Internal Medicine

## 2017-05-18 DIAGNOSIS — E1169 Type 2 diabetes mellitus with other specified complication: Secondary | ICD-10-CM

## 2017-05-18 DIAGNOSIS — E669 Obesity, unspecified: Secondary | ICD-10-CM

## 2017-05-18 LAB — HEMOGLOBIN A1C: HEMOGLOBIN A1C: 5.7 % (ref 4.6–6.5)

## 2017-05-18 NOTE — Progress Notes (Unsigned)
E 

## 2017-11-09 ENCOUNTER — Ambulatory Visit: Payer: Self-pay | Admitting: Family Medicine

## 2017-11-09 ENCOUNTER — Encounter: Payer: Self-pay | Admitting: Family Medicine

## 2017-11-09 VITALS — BP 158/94 | HR 76 | Temp 98.2°F | Resp 16

## 2017-11-09 DIAGNOSIS — J309 Allergic rhinitis, unspecified: Secondary | ICD-10-CM

## 2017-11-09 DIAGNOSIS — R059 Cough, unspecified: Secondary | ICD-10-CM

## 2017-11-09 DIAGNOSIS — R05 Cough: Secondary | ICD-10-CM

## 2017-11-09 MED ORDER — LORATADINE 10 MG PO TABS: 10.0000 mg | ORAL_TABLET | Freq: Every day | ORAL | 1 refills | Status: DC | PRN

## 2017-11-09 MED ORDER — FLUTICASONE PROPIONATE 50 MCG/ACT NA SUSP: 2.0000 | Freq: Every day | NASAL | 1 refills | Status: DC

## 2017-11-09 MED ORDER — BENZONATATE 100 MG PO CAPS: 100.0000 mg | ORAL_CAPSULE | Freq: Every evening | ORAL | 0 refills | Status: DC | PRN

## 2017-11-09 NOTE — Progress Notes (Signed)
Subjective: "Allergies"     Nicole Burns is a 42 y.o. female who presents for evaluation of eye watering/pruritus, sore throat, dry cough, sneezing, mild facial pressure, and PND for 6 days.  Patient also reports muffled hearing in both of her ears and popping sensation intermittently for the last few days.  Denies significant ear pain.  Patient reports low-grade fever (T-max 99.8) and chills Saturday that has completely resolved and not recurred.  Patient reports seasonal allergic rhinitis some years but not all and that she takes her husband's OTC allergy medication for this (unsure of name).  Patient reports onset of symptoms following being outdoors frequently, including attending an outdoor funeral where she was covered in pollen. Treatment to date: OTC allergy med (unsure the name), allergy eyedrops (unknown name or ingredients), Alka-Seltzer cold and flu.  Denies rash, nausea, vomiting, diarrhea, shortness of breath, wheezing, chest or back pain, difficulty swallowing, confusion, purulent nasal congestion, headache, body aches, fatigue, severe symptoms, or initial improvement and then worsening of symptoms. History of smoking, asthma, COPD: Negative. History of recurrent sinus and/or lung infections: Negative. PCP: Dr. Derrel Nip.   Review of Systems Pertinent items noted in HPI and remainder of comprehensive ROS otherwise negative.     Objective:   Physical Exam General: Awake, alert, and oriented. No acute distress. Well developed, hydrated and nourished. Appears stated age. Nontoxic appearance.  HEENT:  PND noted.  Mild erythema to posterior oropharynx.  No edema or exudates of pharynx or tonsils. No erythema or bulging of TM.  Pale/boggy nasal mucosa.  Bilateral maxillary sinuses with mild tenderness.  Sinuses otherwise nontender.  Supple neck without adenopathy. Cardiac: Heart rate and rhythm are normal. No murmurs, gallops, or rubs are auscultated. S1 and S2 are heard and are of normal  intensity.  Respiratory: No signs of respiratory distress. Lungs clear. No tachypnea. Able to speak in full sentences without dyspnea. Nonlabored respirations.  Skin: Skin is warm, dry and intact. Appropriate color for ethnicity. No cyanosis noted.   Assessment:    allergic rhinitis and viral upper respiratory illness   Plan:    Discussed diagnosis and treatment of URI. Discussed the importance of avoiding unnecessary antibiotic therapy. Suggested symptomatic OTC remedies. Nasal saline spray for congestion.   Prescribed Flonase. Discussed not concurrently taking Claritin with husbands OTC allergy medication.   Prescribed Tessalon Perles as needed for coughing at night. Provided education regarding use and discussed side/adverse effects of all medications prescribed. Patient's blood pressure is elevated today at 158/94 by manual measurement.  Patient reports this is higher when she is on her period, which she is currently.  Patient reports that she monitors this frequently at home and is usually below 140/90.  Patient reports she used to take hypertension medications but no longer needs to due to weight loss.  Advised patient to monitor this at least once a day at home and report values over 140/90 to her primary care provider and keep a log.  Follow-up with primary care provider. Discussed red flag symptoms and circumstances with which to seek medical care.   New Prescriptions   BENZONATATE (TESSALON) 100 MG CAPSULE    Take 1 capsule (100 mg total) by mouth at bedtime as needed for cough.   FLUTICASONE (FLONASE) 50 MCG/ACT NASAL SPRAY    Place 2 sprays into both nostrils daily.   LORATADINE (CLARITIN) 10 MG TABLET    Take 1 tablet (10 mg total) by mouth daily as needed for allergies.

## 2017-12-31 LAB — CBC AND DIFFERENTIAL
HCT: 36 (ref 36–46)
Hemoglobin: 11 — AB (ref 12.0–16.0)
Platelets: 289 (ref 150–399)
WBC: 6.6

## 2018-02-02 ENCOUNTER — Other Ambulatory Visit: Payer: Self-pay

## 2019-07-12 LAB — HM MAMMOGRAPHY

## 2019-07-13 ENCOUNTER — Telehealth: Payer: Self-pay | Admitting: Internal Medicine

## 2019-07-13 ENCOUNTER — Emergency Department
Admission: EM | Admit: 2019-07-13 | Discharge: 2019-07-13 | Disposition: A | Discharge status: Home / Self Care | Payer: Managed Care, Other (non HMO) | Attending: Emergency Medicine | Admitting: Emergency Medicine

## 2019-07-13 ENCOUNTER — Encounter: Payer: Self-pay | Admitting: Emergency Medicine

## 2019-07-13 ENCOUNTER — Other Ambulatory Visit: Payer: Self-pay

## 2019-07-13 DIAGNOSIS — E119 Type 2 diabetes mellitus without complications: Secondary | ICD-10-CM | POA: Insufficient documentation

## 2019-07-13 DIAGNOSIS — D649 Anemia, unspecified: Secondary | ICD-10-CM | POA: Insufficient documentation

## 2019-07-13 DIAGNOSIS — I10 Essential (primary) hypertension: Secondary | ICD-10-CM | POA: Insufficient documentation

## 2019-07-13 LAB — CBC
HCT: 22.9 % — ABNORMAL LOW (ref 36.0–46.0)
Hemoglobin: 5.7 g/dL — ABNORMAL LOW (ref 12.0–15.0)
MCH: 15.1 pg — ABNORMAL LOW (ref 26.0–34.0)
MCHC: 24.9 g/dL — ABNORMAL LOW (ref 30.0–36.0)
MCV: 60.6 fL — ABNORMAL LOW (ref 80.0–100.0)
Platelets: 383 10*3/uL (ref 150–400)
RBC: 3.78 MIL/uL — ABNORMAL LOW (ref 3.87–5.11)
RDW: 21.2 % — ABNORMAL HIGH (ref 11.5–15.5)
WBC: 9.9 10*3/uL (ref 4.0–10.5)
nRBC: 0.3 % — ABNORMAL HIGH (ref 0.0–0.2)

## 2019-07-13 LAB — IRON AND TIBC
Iron: 10 ug/dL — ABNORMAL LOW (ref 28–170)
Saturation Ratios: 2 % — ABNORMAL LOW (ref 10.4–31.8)
TIBC: 424 ug/dL (ref 250–450)
UIBC: 414 ug/dL

## 2019-07-13 LAB — COMPREHENSIVE METABOLIC PANEL
ALT: 13 U/L (ref 0–44)
AST: 16 U/L (ref 15–41)
Albumin: 3.7 g/dL (ref 3.5–5.0)
Alkaline Phosphatase: 63 U/L (ref 38–126)
Anion gap: 8 (ref 5–15)
BUN: 12 mg/dL (ref 6–20)
CO2: 22 mmol/L (ref 22–32)
Calcium: 9 mg/dL (ref 8.9–10.3)
Chloride: 106 mmol/L (ref 98–111)
Creatinine, Ser: 0.95 mg/dL (ref 0.44–1.00)
GFR calc Af Amer: >60 mL/min (ref >60)
GFR calc non Af Amer: >60 mL/min (ref >60)
Glucose, Bld: 190 mg/dL — ABNORMAL HIGH (ref 70–99)
Potassium: 4 mmol/L (ref 3.5–5.1)
Sodium: 136 mmol/L (ref 135–145)
Total Bilirubin: 0.6 mg/dL (ref 0.3–1.2)
Total Protein: 7.6 g/dL (ref 6.5–8.1)

## 2019-07-13 LAB — ABO/RH: ABO/RH(D): A POS

## 2019-07-13 LAB — FERRITIN: Ferritin: 3 ng/mL — ABNORMAL LOW (ref 11–307)

## 2019-07-13 MED ORDER — IRON 325 (65 FE) MG PO TABS: 1.0000 | ORAL_TABLET | Freq: Every day | ORAL | 0 refills | Status: DC

## 2019-07-13 MED ORDER — DOCUSATE SODIUM 100 MG PO CAPS: 200.0000 mg | ORAL_CAPSULE | Freq: Two times a day (BID) | ORAL | 0 refills | Status: DC

## 2019-07-13 MED ORDER — SODIUM CHLORIDE 0.9 % IV SOLN: 10.0000 mL/h | Freq: Once | INTRAVENOUS | Status: DC

## 2019-07-13 MED ORDER — AMLODIPINE BESYLATE 5 MG PO TABS: 5.0000 mg | ORAL_TABLET | Freq: Every day | ORAL | 1 refills | Status: DC

## 2019-07-13 MED ORDER — AMLODIPINE BESYLATE 5 MG PO TABS
5.0000 mg | ORAL_TABLET | Freq: Once | ORAL | Status: AC
  Administered 2019-07-13: 5 mg via ORAL
  Filled 2019-07-13: qty 1

## 2019-07-13 NOTE — ED Provider Notes (Signed)
[facility] Emergency Department Provider Note  ____________________________________________  Time seen: Approximately 5:25 PM  I have reviewed the triage vital signs and the nursing notes.   HISTORY  Chief Complaint Abnormal Lab    HPI Nicole Burns is a 43 y.o. female with a history of hypertension diabetes migraine and menorrhagia secondary to uterine fibroids  who comes the ED at her doctor's request for low hemoglobin of 6.3 on routine labs.  Patient does report that she has had some tiredness lately and does get out of breath with walking which she attributes to her anemia.  She states that overall she feels well and does not want to be in the hospital.  Denies chest pain or lightheadedness or orthostatic symptoms.  Denies black or bloody stool or any history of GI bleeding.  No recent NSAID or steroid use, only identifiable source of blood loss is menorrhagia.  She is under the care of a gynecologist.  She reports that she has not been taking her iron supplement consistently.  In the past (most recently September 2020) she has required iron infusions.     Past Medical History:  Diagnosis Date  . Fatty liver   . Gallbladder polyp   . Hepatic cyst   . History of chicken pox   . Hypertension   . Migraine    After menses cycle.   Marland Kitchen Umbilical hernia [id number]     Patient Active Problem List   Diagnosis Date Noted  . Viral gastroenteritis due to [place]-like agent 05/17/2017  . Abortion, missed 08/09/2015  . Hepatic steatosis 01/17/2015  . Abdominal pain 01/17/2015  . Encounter for preventive health examination 02/04/2014  . Menorrhagia 05/04/2013  . Recurrent ventral hernia 01/24/2013  . Umbilical hernia   . Diabetes mellitus type 2 in obese ([place]) 10/31/2012  . Obesity, morbid ([place]) 10/13/2012  . Premenstrual symptom 10/13/2012  . Essential hypertension, benign 10/13/2012     Past Surgical History:  Procedure Laterality Date  . [place] 2003  . DILATION AND EVACUATION N/A 08/01/2015   Procedure: DILATATION AND EVACUATION With Tissue Sent For Chromosomal Analysis;  Surgeon: Servando Salina, MD;  Location: [facility];  Service: Gynecology;  Laterality: N/A;  . HERNIA REPAIR  2007  . OPERATIVE ULTRASOUND N/A 08/01/2015   Procedure: OPERATIVE ULTRASOUND;  Surgeon: Servando Salina, MD;  Location: [facility];  Service: Gynecology;  Laterality: N/A;     Prior to Admission medications   Medication Sig Start Date End Date Taking? Authorizing Provider  benzonatate (TESSALON) 100 MG capsule Take 1 capsule (100 mg total) by mouth at bedtime as needed for cough. Patient not taking: Reported on 07/13/2019 11/09/17   Maximino Sarin, FNP  docusate sodium (COLACE) 100 MG capsule Take 2 capsules (200 mg total) by mouth 2 (two) times daily. 07/13/19   Carrie Mew, MD  Ferrous Sulfate (IRON) 325 (65 Fe) MG TABS Take 1 tablet (325 mg total) by mouth daily. 07/13/19 09/11/19  Carrie Mew, MD  fluticasone [facility]) 50 MCG/ACT nasal spray Place 2 sprays into both nostrils daily. Patient not taking: Reported on 07/13/2019 11/09/17   Maximino Sarin, FNP  loratadine (CLARITIN) 10 MG tablet Take 1 tablet (10 mg total) by mouth daily as needed for allergies. Patient not taking: Reported on 07/13/2019 11/09/17   Maximino Sarin, FNP     Allergies Lisinopril, Maxzide [hydrochlorothiazide w-triamterene], and Vicodin [hydrocodone-acetaminophen]   Family History  Problem Relation Age of Onset  .  Hypertension Mother   . Hyperlipidemia Father   . Heart disease Father   . Stroke Father   . Hypertension Father   . Diabetes Father   . Hyperlipidemia Maternal Grandmother   . Cancer Maternal Grandmother        Breast  . Kidney disease Paternal Grandmother   . Diabetes Paternal Grandmother     Social History Social History   Tobacco Use  . Smoking status: Never Smoker  . Smokeless tobacco: Never Used   Substance Use Topics  . Alcohol use: No  . Drug use: No    Review of Systems  Constitutional:   No fever or chills.  Positive fatigue.  No unusual feeling of coldness ENT:   No sore throat. No rhinorrhea. Cardiovascular:   No chest pain or syncope. Respiratory:   No dyspnea or cough. Gastrointestinal:   Negative for abdominal pain, vomiting and diarrhea.  Musculoskeletal:   Negative for focal pain or swelling All other systems reviewed and are negative except as documented above in ROS and HPI.  ____________________________________________   PHYSICAL EXAM:  VITAL SIGNS: ED Triage Vitals  Enc Vitals Group     BP 07/13/19 1418 (!) 173/79     Pulse Rate 07/13/19 1418 (!) 114     Resp 07/13/19 1418 18     Temp 07/13/19 1418 99.6 F (37.6 C)     Temp Source 07/13/19 1418 Oral     SpO2 07/13/19 1418 99 %     Weight 07/13/19 1401 239 lb (108.4 kg)     Height 07/13/19 1401 5\' 4"  (1.626 m)     Head Circumference --      Peak Flow --      Pain Score 07/13/19 1401 0     Pain Loc --      Pain Edu? --      Excl. in [place]? --     Vital signs reviewed, nursing assessments reviewed.   Constitutional:   Alert and oriented. Non-toxic appearance. Eyes:   Conjunctivae are normal. EOMI.  ENT      Head:   Normocephalic and atraumatic.      Nose:   Wearing a mask.      Mouth/Throat:   Wearing a mask.      Neck:   No meningismus. Full ROM. Hematological/Lymphatic/Immunilogical:   No cervical lymphadenopathy. Cardiovascular:   Tachycardia heart rate 110. strong radial pulse.  No murmurs. Cap refill less than 2 seconds. Respiratory:   Normal respiratory effort without tachypnea/retractions. Breath sounds are clear and equal bilaterally. No wheezes/rales/rhonchi. Gastrointestinal:   Soft and nontender. Non distended. There is no CVA tenderness.  No rebound, rigidity, or guarding. Musculoskeletal:   Normal range of motion in all extremities. No joint effusions.  No lower extremity  tenderness.  No edema. Neurologic:   Normal speech and language.  Motor grossly intact. No acute focal neurologic deficits are appreciated.    ____________________________________________    LABS (pertinent positives/negatives) (all labs ordered are listed, but only abnormal results are displayed) Labs Reviewed  CBC - Abnormal; Notable for the following components:      Result Value   RBC 3.78 (*)    Hemoglobin 5.7 (*)    HCT 22.9 (*)    MCV 60.6 (*)    MCH 15.1 (*)    MCHC 24.9 (*)    RDW 21.2 (*)    nRBC 0.3 (*)    All other components within normal limits  COMPREHENSIVE METABOLIC PANEL - Abnormal; Notable  for the following components:   Glucose, Bld 190 (*)    All other components within normal limits  IRON AND TIBC - Abnormal; Notable for the following components:   Iron 10 (*)    Saturation Ratios 2 (*)    All other components within normal limits  FERRITIN - Abnormal; Notable for the following components:   Ferritin 3 (*)    All other components within normal limits  PREPARE RBC (CROSSMATCH)  TYPE AND SCREEN  ABO/RH   ____________________________________________   EKG    ____________________________________________    RADIOLOGY  No results found.  ____________________________________________   PROCEDURES .Critical Care Performed by: Carrie Mew, MD Authorized by: Carrie Mew, MD   Critical care provider statement:    Critical care time (minutes):  32   Critical care time was exclusive of:  Separately billable procedures and treating other patients   Critical care was necessary to treat or prevent imminent or life-threatening deterioration of the following conditions:  Circulatory failure and shock   Critical care was time spent personally by me on the following activities:  Development of treatment plan with patient or surrogate, discussions with consultants, evaluation of patient's response to treatment, examination of patient,  obtaining history from patient or surrogate, ordering and performing treatments and interventions, ordering and review of laboratory studies, ordering and review of radiographic studies, pulse oximetry, re-evaluation of patient's condition and review of old charts    ____________________________________________    CLINICAL IMPRESSION / [place] / ED COURSE  Medications ordered in the ED: Medications  0.9 %  sodium chloride infusion (has no administration in time range)    Pertinent labs & imaging results that were available during my care of the patient were reviewed by me and considered in my medical decision making (see chart for details).  Nicole Burns was evaluated in Emergency Department on 07/13/2019 for the symptoms described in the history of present illness. She was evaluated in the context of the global COVID-19 pandemic, which necessitated consideration that the patient might be at risk for infection with the SARS-CoV-2 virus that causes COVID-19. Institutional protocols and algorithms that pertain to the evaluation of patients at risk for COVID-19 are in a state of rapid change based on information released by regulatory bodies including the CDC and federal and state organizations. These policies and algorithms were followed during the patient's care in the ED.   Patient presents with anemia.  Hemoglobin of 5.7 on our labs.  MCV is 60 and RDW high consistent with iron deficiency anemia.  Patient strongly wishes to go home and not stay in the hospital.  She does verbally consent to receiving a blood transfusion in the emergency department due to starting to develop some mild symptoms and having tachycardia as evidence of physiologic compensation.  After transfusion I think she will be stable for discharge home to continue following up with her gynecologist, and resuming oral iron supplementation.  Clinical Course as of Jul 12 2102  Wed Jul 13, 2019  [id number] Iron panel  confirms iron deficiency anemia.  Transfusions going after which patient can be observed for adverse effects and discharged home   [PS]  2102 After transfusion, tachycardia improved.  Patient feels better.  No shortness of breath tongue swelling or wheezing.  No hives or other cutaneous reaction.  Stable for discharge home at this point.   [PS]    Clinical Course User Index [PS] Carrie Mew, MD     ____________________________________________   FINAL  CLINICAL IMPRESSION(S) / ED DIAGNOSES    Final diagnoses:  Symptomatic anemia     ED Discharge Orders         Ordered    Ferrous Sulfate (IRON) 325 (65 Fe) MG TABS  Daily     07/13/19 1725    docusate sodium (COLACE) 100 MG capsule  2 times daily     07/13/19 1725          Portions of this note were generated with dragon dictation software. Dictation errors may occur despite best attempts at proofreading.   Carrie Mew, MD 07/13/19 2104

## 2019-07-13 NOTE — Telephone Encounter (Signed)
[facility] called, [place]

## 2019-07-13 NOTE — ED Triage Notes (Signed)
Pt reports went to her MD office for a check up and they called and told her to come to the ED for abnormal labs. Per MD office, pt hbg 6.3. Pt denies symptoms.

## 2019-07-13 NOTE — Telephone Encounter (Signed)
Pt was advised and gave a verbal understanding. ED charge nurse called.

## 2019-07-13 NOTE — Discharge Instructions (Addendum)
Your lab tests show severe iron deficiency causing your anemia.  Be sure to take an iron supplement every day and continue following up with your doctor.

## 2019-07-13 NOTE — Telephone Encounter (Signed)
Received the lab result and the Hemoglobin is 6.3. Spoke with Dr. Terese Door and she stated verbally that the pt needs to go over the ED now. I called the pt back to let her know and she stated that her hemoglobin has always been low and she had to have iron transfusions last year. Pt also stated that she just came off of her menstrual cycle last week. Went and spoke verbally with Dr. Terese Door to let her know about this and she stated that the pt still needs to go over to the ED. Called pt back to let her know that she still needs to go over to the ED. Pt gave a verbal understanding.

## 2019-07-13 NOTE — Telephone Encounter (Signed)
Rec go to ED hbg 6.3 too low likely needs transfusion blood/iron

## 2019-07-13 NOTE — ED Notes (Signed)
Per blood bank, pt needs a lavender tube sent, recollected and sent to lab

## 2019-07-13 NOTE — Telephone Encounter (Signed)
PT's OBG[facility], [facility] called, sending a urgent fax about pt's hemoglobin being very low. Will bring fax back to [place] when received.

## 2019-07-14 LAB — BPAM RBC
Blood Product Expiration Date: 202101182359
Blood Product Expiration Date: 202101182359
ISSUE DATE / TIME: 202012231837
Unit Type and Rh: 6200
Unit Type and Rh: 6200

## 2019-07-14 LAB — TYPE AND SCREEN
ABO/RH(D): A POS
Antibody Screen: NEGATIVE
Unit division: 0
Unit division: 0

## 2019-07-14 LAB — PREPARE RBC (CROSSMATCH)

## 2019-07-18 ENCOUNTER — Ambulatory Visit (INDEPENDENT_AMBULATORY_CARE_PROVIDER_SITE_OTHER): Payer: Managed Care, Other (non HMO) | Admitting: Internal Medicine

## 2019-07-18 ENCOUNTER — Encounter: Payer: Self-pay | Admitting: Internal Medicine

## 2019-07-18 ENCOUNTER — Other Ambulatory Visit: Payer: Self-pay

## 2019-07-18 VITALS — BP 140/90 | Ht 65.0 in | Wt 261.4 lb

## 2019-07-18 DIAGNOSIS — N920 Excessive and frequent menstruation with regular cycle: Secondary | ICD-10-CM | POA: Diagnosis not present

## 2019-07-18 DIAGNOSIS — E1169 Type 2 diabetes mellitus with other specified complication: Secondary | ICD-10-CM

## 2019-07-18 DIAGNOSIS — D500 Iron deficiency anemia secondary to blood loss (chronic): Secondary | ICD-10-CM | POA: Diagnosis not present

## 2019-07-18 DIAGNOSIS — I5021 Acute systolic (congestive) heart failure: Secondary | ICD-10-CM | POA: Diagnosis not present

## 2019-07-18 DIAGNOSIS — E669 Obesity, unspecified: Secondary | ICD-10-CM

## 2019-07-18 NOTE — Progress Notes (Signed)
Virtual Visit via [email or web address]  This visit type was conducted due to national recommendations for restrictions regarding the COVID-19 pandemic (e.g. social distancing).  This format is felt to be most appropriate for this patient at this time.  All issues noted in this document were discussed and addressed.  No physical exam was performed (except for noted visual exam findings with Video Visits).   I connected with@ on 07/18/19 at  2:00 PM EST by a video enabled telemedicine application and verified that I am speaking with the correct person using two identifiers. Location patient: home Location provider: work or home office Persons participating in the virtual visit: patient, provider  I discussed the limitations, risks, security and privacy concerns of performing an evaluation and management service by telephone and the availability of in person appointments. I also discussed with the patient that there may be a patient responsible charge related to this service. The patient expressed understanding and agreed to proceed    Reason for visit: cough  HPI:   43 yr old female with hypertension,  IDA secondary to menorrhagia, obesity, type 2 DM.   Patient has been lost to follow up, last seen Oct 2018 .  Patient was directed to ER for transfusion on  Dec 23 after  screening labs done by her gynecologist [facility]) were faxed to our office for review . patient's hemoglobin  was 6.3 .  She  was advised to go to ED by Dr Aundra Dubin and repeat hgb in ER was 5.7 .  Previous hgb was > 10 in June 2019     Patient presents today for discussion of cough of several months which she now states has resolved since her transfusion.  She reports that she was having  nonproductive cough,  Orthopnea and tachycardia ,  All of which had been present for several months and resolved with 48 hours of the transfusion she denies any history of  LE edema.   Menorrhagia bleeds 7 days every month,  Uses 8-10 tampons  On  her heaviest day  Feels dizzy for several days during each menstrual period .  Has had prior iron infusions, but none in a year.  Did not tolerate  Oral iron usingan "all natural" product blood builder that contains 26 mg of iron   ROS: See pertinent positives and negatives per HPI.  Past Medical History:  Diagnosis Date  . Abortion, missed 08/09/2015  . Fatty liver   . Gallbladder polyp   . Hepatic cyst   . History of chicken pox   . Hypertension   . Migraine    After menses cycle.   Marland Kitchen Umbilical hernia [id number]    Past Surgical History:  Procedure Laterality Date  . [place] 2003  . DILATION AND EVACUATION N/A 08/01/2015   Procedure: DILATATION AND EVACUATION With Tissue Sent For Chromosomal Analysis;  Surgeon: Servando Salina, MD;  Location: [facility];  Service: Gynecology;  Laterality: N/A;  . HERNIA REPAIR  2007  . OPERATIVE ULTRASOUND N/A 08/01/2015   Procedure: OPERATIVE ULTRASOUND;  Surgeon: Servando Salina, MD;  Location: [facility];  Service: Gynecology;  Laterality: N/A;    Family History  Problem Relation Age of Onset  . Hypertension Mother   . Hyperlipidemia Father   . Heart disease Father   . Stroke Father   . Hypertension Father   . Diabetes Father   . Hyperlipidemia Maternal Grandmother   . Cancer Maternal Grandmother  Breast  . Kidney disease Paternal Grandmother   . Diabetes Paternal Grandmother     SOCIAL HX:  reports that she has never smoked. She has never used smokeless tobacco. She reports that she does not drink alcohol or use drugs.   Current Outpatient Medications:  .  amLODipine (NORVASC) 5 MG tablet, Take 1 tablet (5 mg total) by mouth daily., Disp: 30 tablet, Rfl: 1 .  Ferrous Sulfate (IRON) 325 (65 Fe) MG TABS, Take 1 tablet (325 mg total) by mouth daily., Disp: 60 tablet, Rfl: 0  EXAM:  VITALS per patient if applicable:  GENERAL: alert, oriented, appears well and in no acute distress  HEENT: atraumatic,  conjunttiva clear, no obvious abnormalities on inspection of external nose and ears  NECK: normal movements of the head and neck  LUNGS: on inspection no signs of respiratory distress, breathing rate appears normal, no obvious gross SOB, gasping or wheezing  CV: no obvious cyanosis  MS: moves all visible extremities without noticeable abnormality  PSYCH/NEURO: pleasant and cooperative, no obvious depression or anxiety, speech and thought processing grossly intact  ASSESSMENT AND PLAN:  Discussed the following assessment and plan:  Diabetes mellitus type 2 in obese ([place]) - Plan: Comprehensive metabolic panel, Hemoglobin A1c, Microalbumin / creatinine urine ratio, Lipid panel  Menorrhagia with regular cycle - Plan: Iron, TIBC and Ferritin Panel, CBC with Differential/Platelet, TSH  Iron deficiency anemia due to chronic blood loss - Plan: CBC with Differential/Platelet  Acute systolic heart failure (HCC)  Iron deficiency anemia due to chronic blood loss She was transfused in ED on Dec 23 for hgb of 5.7 and symptoms consistent with high output heart failure .  Iron stores were low.  Etiology appears to be menorrhagia.  Advised patient to continue her recently resumed iron  supplementation and discuss hysterectomy with her GYN .  Will repeat CBC and iron studies in one month; refer to [place] if no improvement   Menorrhagia Resulting in severe iron deficiency , recurrent. Marland Kitchen  Hysterectomy advised   Acute systolic heart failure ([place]) Secondary to severe anemia.  symptoms now resolved s/p transfusion   Diabetes mellitus type 2 in obese She has been lost to follow up since Oct 2018.  Random serum glucose was 190.    She will return for a1c and fasting labs.  Lab Results  Component Value Date   HGBA1C 5.7 05/18/2017       I discussed the assessment and treatment plan with the patient. The patient was provided an opportunity to ask questions and all were answered. The patient  agreed with the plan and demonstrated an understanding of the instructions.   A total of 40 minutes was spent with patient in non face to face time, more than half of which was spent in counseling patient on the above mentioned issues , reviewing and explaining recent labs and imaging studies done, and coordination of care. [name], MD

## 2019-07-19 ENCOUNTER — Encounter: Payer: Self-pay | Admitting: Internal Medicine

## 2019-07-19 DIAGNOSIS — I5021 Acute systolic (congestive) heart failure: Secondary | ICD-10-CM | POA: Insufficient documentation

## 2019-07-19 DIAGNOSIS — D500 Iron deficiency anemia secondary to blood loss (chronic): Secondary | ICD-10-CM | POA: Insufficient documentation

## 2019-07-19 NOTE — Assessment & Plan Note (Signed)
Resulting in severe iron deficiency , recurrent. Marland Kitchen  Hysterectomy advised

## 2019-07-19 NOTE — Assessment & Plan Note (Signed)
Secondary to severe anemia.  symptoms now resolved s/p transfusion

## 2019-07-19 NOTE — Assessment & Plan Note (Signed)
She has been lost to follow up since Oct 2018.  Random serum glucose was 190.    She will return for a1c and fasting labs.  Lab Results  Component Value Date   HGBA1C 5.7 05/18/2017

## 2019-07-19 NOTE — Assessment & Plan Note (Signed)
She was transfused in ED on Dec 23 for hgb of 5.7 and symptoms consistent with high output heart failure .  Iron stores were low.  Etiology appears to be menorrhagia.  Advised patient to continue her recently resumed iron  supplementation and discuss hysterectomy with her GYN .  Will repeat CBC and iron studies in one month; refer to [place] if no improvement

## 2019-07-29 ENCOUNTER — Encounter: Payer: Self-pay | Admitting: Internal Medicine

## 2019-08-18 ENCOUNTER — Other Ambulatory Visit: Payer: Self-pay

## 2019-08-18 ENCOUNTER — Other Ambulatory Visit (INDEPENDENT_AMBULATORY_CARE_PROVIDER_SITE_OTHER): Payer: Managed Care, Other (non HMO)

## 2019-08-18 DIAGNOSIS — E1169 Type 2 diabetes mellitus with other specified complication: Secondary | ICD-10-CM | POA: Diagnosis not present

## 2019-08-18 DIAGNOSIS — E669 Obesity, unspecified: Secondary | ICD-10-CM | POA: Diagnosis not present

## 2019-08-18 DIAGNOSIS — D500 Iron deficiency anemia secondary to blood loss (chronic): Secondary | ICD-10-CM

## 2019-08-18 DIAGNOSIS — N920 Excessive and frequent menstruation with regular cycle: Secondary | ICD-10-CM | POA: Diagnosis not present

## 2019-08-18 LAB — COMPREHENSIVE METABOLIC PANEL
ALT: 11 U/L (ref 0–35)
AST: 13 U/L (ref 0–37)
Albumin: 3.9 g/dL (ref 3.5–5.2)
Alkaline Phosphatase: 63 U/L (ref 39–117)
BUN: 15 mg/dL (ref 6–23)
CO2: 24 mEq/L (ref 19–32)
Calcium: 9.5 mg/dL (ref 8.4–10.5)
Chloride: 105 mEq/L (ref 96–112)
Creatinine, Ser: 0.92 mg/dL (ref 0.40–1.20)
GFR: 79.58 mL/min (ref >60.00)
Glucose, Bld: 149 mg/dL — ABNORMAL HIGH (ref 70–99)
Potassium: 3.9 mEq/L (ref 3.5–5.1)
Sodium: 135 mEq/L (ref 135–145)
Total Bilirubin: 0.3 mg/dL (ref 0.2–1.2)
Total Protein: 7.4 g/dL (ref 6.0–8.3)

## 2019-08-18 LAB — CBC WITH DIFFERENTIAL/PLATELET
Basophils Absolute: 0.1 10*3/uL (ref 0.0–0.1)
Basophils Relative: 0.8 % (ref 0.0–3.0)
Eosinophils Absolute: 0.2 10*3/uL (ref 0.0–0.7)
Eosinophils Relative: 2.2 % (ref 0.0–5.0)
HCT: 30.6 % — ABNORMAL LOW (ref 36.0–46.0)
Hemoglobin: 9 g/dL — ABNORMAL LOW (ref 12.0–15.0)
Lymphocytes Relative: 25.4 % (ref 12.0–46.0)
Lymphs Abs: 2 10*3/uL (ref 0.7–4.0)
MCHC: 29.5 g/dL — ABNORMAL LOW (ref 30.0–36.0)
MCV: 66.4 fl — ABNORMAL LOW (ref 78.0–100.0)
Monocytes Absolute: 0.8 10*3/uL (ref 0.1–1.0)
Monocytes Relative: 10.5 % (ref 3.0–12.0)
Neutro Abs: 4.8 10*3/uL (ref 1.4–7.7)
Neutrophils Relative %: 61.1 % (ref 43.0–77.0)
Platelets: 334 10*3/uL (ref 150.0–400.0)
RBC: 4.61 Mil/uL (ref 3.87–5.11)
RDW: 32.5 % — ABNORMAL HIGH (ref 11.5–15.5)
WBC: 7.9 10*3/uL (ref 4.0–10.5)

## 2019-08-18 LAB — IBC + FERRITIN
Ferritin: 10.6 ng/mL (ref 10.0–291.0)
Iron: 24 ug/dL — ABNORMAL LOW (ref 42–145)
Saturation Ratios: 6 % — ABNORMAL LOW (ref 20.0–50.0)
Transferrin: 286 mg/dL (ref 212.0–360.0)

## 2019-08-18 LAB — LIPID PANEL
Cholesterol: 140 mg/dL (ref 0–200)
HDL: 39.9 mg/dL (ref >39.00)
LDL Cholesterol: 87 mg/dL (ref 0–99)
NonHDL: 99.7
Total CHOL/HDL Ratio: 3
Triglycerides: 64 mg/dL (ref 0.0–149.0)
VLDL: 12.8 mg/dL (ref 0.0–40.0)

## 2019-08-18 LAB — MICROALBUMIN / CREATININE URINE RATIO
Creatinine,U: 200.6 mg/dL
Microalb Creat Ratio: 3.3 mg/g (ref 0.0–30.0)
Microalb, Ur: 6.6 mg/dL — ABNORMAL HIGH (ref 0.0–1.9)

## 2019-08-18 LAB — TSH: TSH: 2.05 u[IU]/mL (ref 0.35–4.50)

## 2019-08-18 LAB — HEMOGLOBIN A1C: Hgb A1c MFr Bld: 5.5 % (ref 4.6–6.5)

## 2019-08-18 NOTE — Progress Notes (Signed)
Pt is on her cycle & wanted to let us know since she had a urine ordered today.

## 2019-08-22 ENCOUNTER — Other Ambulatory Visit: Payer: Self-pay | Admitting: Internal Medicine

## 2019-08-22 DIAGNOSIS — D500 Iron deficiency anemia secondary to blood loss (chronic): Secondary | ICD-10-CM

## 2019-09-19 ENCOUNTER — Telehealth: Payer: Self-pay | Admitting: Internal Medicine

## 2019-09-19 MED ORDER — AMLODIPINE BESYLATE 5 MG PO TABS: 5.0000 mg | ORAL_TABLET | Freq: Every day | ORAL | 1 refills | Status: DC

## 2019-09-19 NOTE — Telephone Encounter (Signed)
Pt needs a refill on amLODipine (NORVASC) 5 MG tablet sent to [facility] on  [address] this is her new pharmacy

## 2019-09-21 ENCOUNTER — Other Ambulatory Visit: Payer: Self-pay

## 2019-09-21 ENCOUNTER — Other Ambulatory Visit (INDEPENDENT_AMBULATORY_CARE_PROVIDER_SITE_OTHER): Payer: Self-pay

## 2019-09-21 DIAGNOSIS — D500 Iron deficiency anemia secondary to blood loss (chronic): Secondary | ICD-10-CM

## 2019-09-21 LAB — CBC WITH DIFFERENTIAL/PLATELET
Basophils Absolute: 0.1 10*3/uL (ref 0.0–0.1)
Basophils Relative: 0.6 % (ref 0.0–3.0)
Eosinophils Absolute: 0.1 10*3/uL (ref 0.0–0.7)
Eosinophils Relative: 1.7 % (ref 0.0–5.0)
HCT: 30.2 % — ABNORMAL LOW (ref 36.0–46.0)
Hemoglobin: 9.3 g/dL — ABNORMAL LOW (ref 12.0–15.0)
Lymphocytes Relative: 21.5 % (ref 12.0–46.0)
Lymphs Abs: 1.8 10*3/uL (ref 0.7–4.0)
MCHC: 30.8 g/dL (ref 30.0–36.0)
MCV: 74.6 fl — ABNORMAL LOW (ref 78.0–100.0)
Monocytes Absolute: 0.8 10*3/uL (ref 0.1–1.0)
Monocytes Relative: 8.9 % (ref 3.0–12.0)
Neutro Abs: 5.8 10*3/uL (ref 1.4–7.7)
Neutrophils Relative %: 67.3 % (ref 43.0–77.0)
Platelets: 363 10*3/uL (ref 150.0–400.0)
RBC: 4.05 Mil/uL (ref 3.87–5.11)
RDW: 25.7 % — ABNORMAL HIGH (ref 11.5–15.5)
WBC: 8.5 10*3/uL (ref 4.0–10.5)

## 2019-09-21 MED ORDER — AMLODIPINE BESYLATE 5 MG PO TABS: 5.0000 mg | ORAL_TABLET | Freq: Every day | ORAL | 1 refills | Status: DC

## 2019-09-22 LAB — IRON,TIBC AND FERRITIN PANEL
%SAT: 7 % (calc) — ABNORMAL LOW (ref 16–45)
Ferritin: 10 ng/mL — ABNORMAL LOW (ref 16–232)
Iron: 24 ug/dL — ABNORMAL LOW (ref 40–190)
TIBC: 356 mcg/dL (calc) (ref 250–450)

## 2019-11-10 ENCOUNTER — Other Ambulatory Visit: Payer: Self-pay | Admitting: Internal Medicine

## 2019-11-24 ENCOUNTER — Other Ambulatory Visit: Payer: Self-pay | Admitting: Internal Medicine

## 2020-01-30 ENCOUNTER — Ambulatory Visit: Payer: Managed Care, Other (non HMO) | Admitting: Internal Medicine

## 2020-01-30 DIAGNOSIS — Z0289 Encounter for other administrative examinations: Secondary | ICD-10-CM

## 2020-01-31 ENCOUNTER — Ambulatory Visit: Payer: Managed Care, Other (non HMO) | Attending: Internal Medicine

## 2020-01-31 DIAGNOSIS — Z23 Encounter for immunization: Secondary | ICD-10-CM

## 2020-01-31 NOTE — Progress Notes (Signed)
   Covid-19 Vaccination Clinic  Name:  Nicole Burns    MRN: [id number] DOB: July 15, 1976  01/31/2020  [name] was observed post Covid-19 immunization for 30 minutes based on pre-vaccination screening without incident. She was provided with Vaccine Information Sheet and instruction to access the V-Safe system.   [name] was instructed to call 911 with any severe reactions post vaccine: Marland Kitchen Difficulty breathing  . Swelling of face and throat  . A fast heartbeat  . A bad rash all over body  . Dizziness and weakness   Immunizations Administered    Name Date Dose VIS Date Route   Pfizer COVID-19 Vaccine 01/31/2020  2:25 PM 0.3 mL 09/14/2018 Intramuscular   Manufacturer: [place]   Lot: [id number]   [place]arian[place] Colon: [id number]

## 2020-02-27 ENCOUNTER — Ambulatory Visit: Payer: Managed Care, Other (non HMO) | Attending: Internal Medicine

## 2020-02-27 ENCOUNTER — Ambulatory Visit: Payer: Self-pay

## 2020-02-27 DIAGNOSIS — Z23 Encounter for immunization: Secondary | ICD-10-CM

## 2020-02-27 NOTE — Progress Notes (Signed)
   Covid-19 Vaccination Clinic  Name:  Nicole Burns    MRN: [id number] DOB: Aug 23, 1975  02/27/2020  [name] was observed post Covid-19 immunization for 30 minutes based on pre-vaccination screening without incident. She was provided with Vaccine Information Sheet and instruction to access the V-Safe system.   [name] was instructed to call 911 with any severe reactions post vaccine: Marland Kitchen Difficulty breathing  . Swelling of face and throat  . A fast heartbeat  . A bad rash all over body  . Dizziness and weakness   Immunizations Administered    Name Date Dose VIS Date Route   Pfizer COVID-19 Vaccine 02/27/2020  4:45 PM 0.3 mL 09/14/2018 Intramuscular   Manufacturer: [place]   Lot: [id number]   [place]: [id number]

## 2020-08-20 ENCOUNTER — Other Ambulatory Visit: Payer: Managed Care, Other (non HMO)

## 2020-08-20 DIAGNOSIS — Z20822 Contact with and (suspected) exposure to covid-19: Secondary | ICD-10-CM

## 2020-08-21 LAB — SARS-COV-2, NAA 2 DAY TAT

## 2020-08-21 LAB — NOVEL CORONAVIRUS, NAA: SARS-CoV-2, NAA: DETECTED — AB

## 2020-08-22 ENCOUNTER — Telehealth: Payer: Self-pay | Admitting: Oncology

## 2020-08-22 ENCOUNTER — Encounter: Payer: Self-pay | Admitting: Oncology

## 2020-08-22 NOTE — Telephone Encounter (Signed)
Called to discuss with patient about COVID-19 symptoms and the use of one of the available treatments for those with mild to moderate Covid symptoms and at a high risk of hospitalization.  Pt appears to qualify for outpatient treatment due to co-morbid conditions and/or a member of an at-risk group in accordance with the FDA Emergency Use Authorization.    Symptom onset: Unsure Vaccinated: yes Booster? no Immunocompromised? No  Qualifiers: obesity, CHF, Type 2 DM,   Unable to reach pt - Left VM and MCM  Nicole Burns

## 2020-09-20 LAB — HM MAMMOGRAPHY

## 2020-11-28 ENCOUNTER — Ambulatory Visit: Payer: Managed Care, Other (non HMO) | Admitting: Cardiovascular Disease

## 2020-12-10 NOTE — Progress Notes (Signed)
Advanced Hypertension Clinic Initial Assessment:    Date:  12/11/2020   ID:  Nicole Burns, DOB 03-Feb-1976, MRN [id number]  PCP:  [name], MD  Cardiologist:  None  Nephrologist:  Referring MD: Servando Salina, MD   CC: Hypertension  History of Present Illness:    Nicole Burns is a 45 y.o. female with a hx of hypertension, diabetes, obesity, and migraine, here to establish care in the hypertension clinic 12/11/2020. She saw Dr. Garwin Brothers on 09/2020 and her blood pressure was 150/92. Today, she reports having hypertension ever since her first pregnancy about 29 years ago. She developed preeclampsia at her first pregnancy but not at her second (19 years ago). Her blood pressure was stable for a time, but has increased recently. She has not been monitoring her blood pressure at home. Previously she was on HCTZ, but this was discontinued due to side effects of leg cramps and fatigue. She normally has LE edema around her ankles, particularly during her period. Her husband tells her that she snores, but her breathing at night is uninterrupted. She denies any chest pain, shortness of breath, palpitations, or exertional symptoms. No headaches, lightheadedness, or syncope to report. Also has no orthopnea or PND. She will drink Coke Zero, and tries to limit herself to 2 in a day. Alcohol consumption is very rarely. When she lived alone with her husband, they balanced cooked meals and ordering out. Her daughter is home from college at this time. Also, she has mostly eliminated pork from her diet. For pain, she takes ibuprofen or the dual action tylenol/ibuprofen tablets. She mostly needs pain medication for cramping during her menstrual cycle. She does not regularly participate in formal exercise, but wants to get back in a routine. When she exercises she enjoys Zumba and walking, and feels better overall. In her family, her mother, father, and sister all have hypertension. Her father died of a  sudden massive heart attack. 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   Previous antihypertensives: Lisinopril-lip swelling, shortness of breath HCTZ-leg cramps, fatigue Spironolactone- unsure why she stopped   Past Medical History:  Diagnosis Date  . Abortion, missed 08/09/2015  . Fatty liver   . Gallbladder polyp   . Hepatic cyst   . History of chicken pox   . Hypertension   . Migraine    After menses cycle.   Marland Kitchen Umbilical hernia [id number]    Past Surgical History:  Procedure Laterality Date  . [place] 2003  . DILATION AND EVACUATION N/A 08/01/2015   Procedure: DILATATION AND EVACUATION With Tissue Sent For Chromosomal Analysis;  Surgeon: Servando Salina, MD;  Location: [facility];  Service: Gynecology;  Laterality: N/A;  . HERNIA REPAIR  2007  . OPERATIVE ULTRASOUND N/A 08/01/2015   Procedure: OPERATIVE ULTRASOUND;  Surgeon: Servando Salina, MD;  Location: [facility];  Service: Gynecology;  Laterality: N/A;    Current Medications: Current Meds  Medication Sig  . amLODipine (NORVASC) 5 MG tablet TAKE 1 TABLET BY MOUTH EVERY DAY  . ASHWAGANDHA PO Take by mouth.  . Ferrous Sulfate (IRON) 325 (65 Fe) MG TABS Take 1 tablet (325 mg total) by mouth daily.  . furosemide (LASIX) 20 MG tablet DAILY AS NEEDED FOR WEIGHT GAIN OR SWELLING  . spironolactone (ALDACTONE) 25 MG tablet Take 1 tablet (25 mg total) by mouth daily.     Allergies:   Lisinopril, Maxzide [hydrochlorothiazide w-triamterene], and Vicodin [hydrocodone-acetaminophen]   Social History   Socioeconomic  History  . Marital status: Married    Spouse name: Not on file  . Number of children: Not on file  . Years of education: Not on file  . Highest education level: Not on file  Occupational History  . Not on file  Tobacco Use  . Smoking status: Never Smoker  . Smokeless tobacco: Never Used  Substance and Sexual Activity  . Alcohol use: No  . Drug use: No  . Sexual activity: Yes  Other Topics Concern   . Not on file  Social History Narrative  . Not on file   Social Determinants of Health   Financial Resource Strain: Not on file  Food Insecurity: Not on file  Transportation Needs: Not on file  Physical Activity: Not on file  Stress: Not on file  Social Connections: Not on file     Family History: The patient's family history includes Cancer in her maternal grandmother; Diabetes in her father and paternal grandmother; Heart attack in her father; Heart disease in her father; Hyperlipidemia in her father and maternal grandmother; Hypertension in her father, mother, and sister; Kidney disease in her paternal grandmother; Stroke in her father.  ROS:   Please see the history of present illness.    (+) LE edema around the ankles All other systems reviewed and are negative.  EKGs/Labs/Other Studies Reviewed:    EKG:   12/11/2020: NSR, rate 90 bpm, Nonspecific T wave abnormalities  Recent Labs: No results found for requested labs within last 8760 hours.   Recent Lipid Panel    Component Value Date/Time   CHOL 140 08/18/2019 0843   TRIG 64.0 08/18/2019 0843   HDL 39.90 08/18/2019 0843   CHOLHDL 3 08/18/2019 0843   VLDL 12.8 08/18/2019 0843   LDLCALC 87 08/18/2019 0843    Physical Exam:   VS:  BP (!) 194/110 (BP Location: Right Arm, Patient Position: Sitting)   Pulse 90   Ht 5\' 3"  (1.6 m)   Wt 260 lb 6.4 oz (118.1 kg)   SpO2 100%   BMI 46.13 kg/m  , BMI Body mass index is 46.13 kg/m. GENERAL:  Well appearing HEENT: Pupils equal round and reactive, fundi not visualized, oral mucosa unremarkable NECK:  No jugular venous distention, waveform within normal limits, carotid upstroke brisk and symmetric, no bruits, no thyromegaly LYMPHATICS:  No cervical adenopathy LUNGS:  Clear to auscultation bilaterally HEART:  RRR.  PMI not displaced or sustained,S1 and S2 within normal limits, no S3, no S4, no clicks, no rubs, no murmurs ABD:  Flat, positive bowel sounds normal in  frequency in pitch, no bruits, no rebound, no guarding, no midline pulsatile mass, no hepatomegaly, no splenomegaly EXT:  2 plus pulses throughout, no edema, no cyanosis no clubbing SKIN:  No rashes no nodules NEURO:  Cranial nerves II through XII grossly intact, motor grossly intact throughout PSYCH:  Cognitively intact, oriented to person place and time   ASSESSMENT/PLAN:    Essential hypertension, benign Blood pressure is poorly controlled.  She notes some intermittent swelling.  She did not tolerate hydrochlorothiazide due to leg cramps.  We will try spironolactone 25 mg daily.  Check a basic metabolic panel in a week.  She was encouraged to increase her exercise to 150 minutes weekly and limit sodium intake to 1500 mg a day.  She was given an advance hypertension clinic book.  She was also referred to the PREP program through the [facility].  Continue amlodipine.  She was also given a prescription for  furosemide 20 mg to be taken as needed for weight gain or swelling.   Screening for Secondary Hypertension:  Causes 12/11/2020  Drugs/Herbals Screened     - Comments 2 Coke Zero, ibuprofen    Relevant Labs/Studies: Basic Labs Latest Ref Rng & Units 08/18/2019 07/13/2019 05/15/2017  Sodium 135 - 145 mEq/L 135 136 134(L)  Potassium 3.5 - 5.1 mEq/L 3.9 4.0 3.9  Creatinine 0.40 - 1.20 mg/dL 0.92 0.95 1.02    Thyroid  Latest Ref Rng & Units 08/18/2019 08/07/2015  TSH 0.35 - 4.50 uIU/mL 2.05 0.98                  Disposition:    FU with PharmD in 1 month. FU with MD in 4 months.    Medication Adjustments/Labs and Tests Ordered: Current medicines are reviewed at length with the patient today.  Concerns regarding medicines are outlined above.  Orders Placed This Encounter  Procedures  . Basic metabolic panel  . EKG 12-Lead   Meds ordered this encounter  Medications  . furosemide (LASIX) 20 MG tablet    Sig: DAILY AS NEEDED FOR WEIGHT GAIN OR SWELLING    Dispense:  30 tablet     Refill:  3  . spironolactone (ALDACTONE) 25 MG tablet    Sig: Take 1 tablet (25 mg total) by mouth daily.    Dispense:  90 tablet    Refill:  3   I,Mathew Stumpf,acting as a scribe for Skeet Latch, MD.,have documented all relevant documentation on the behalf of Skeet Latch, MD,as directed by  Skeet Latch, MD while in the presence of Skeet Latch, MD.  I, Seibert Oval Linsey, MD have reviewed all documentation for this visit.  The documentation of the exam, diagnosis, procedures, and orders on 12/11/2020 are all accurate and complete.   Signed, Skeet Latch, MD  12/11/2020 1:59 PM    [facility]

## 2020-12-11 ENCOUNTER — Ambulatory Visit (INDEPENDENT_AMBULATORY_CARE_PROVIDER_SITE_OTHER): Payer: Managed Care, Other (non HMO) | Admitting: Cardiovascular Disease

## 2020-12-11 ENCOUNTER — Other Ambulatory Visit: Payer: Self-pay

## 2020-12-11 ENCOUNTER — Encounter: Payer: Self-pay | Admitting: Cardiovascular Disease

## 2020-12-11 ENCOUNTER — Other Ambulatory Visit: Payer: Self-pay | Admitting: Internal Medicine

## 2020-12-11 VITALS — BP 194/110 | HR 90 | Ht 63.0 in | Wt 260.4 lb

## 2020-12-11 DIAGNOSIS — I10 Essential (primary) hypertension: Secondary | ICD-10-CM

## 2020-12-11 DIAGNOSIS — Z5181 Encounter for therapeutic drug level monitoring: Secondary | ICD-10-CM | POA: Diagnosis not present

## 2020-12-11 MED ORDER — FUROSEMIDE 20 MG PO TABS: ORAL_TABLET | ORAL | 3 refills | Status: DC

## 2020-12-11 MED ORDER — SPIRONOLACTONE 25 MG PO TABS: 25.0000 mg | ORAL_TABLET | Freq: Every day | ORAL | 3 refills | Status: DC

## 2020-12-11 NOTE — Assessment & Plan Note (Addendum)
Blood pressure is poorly controlled.  She notes some intermittent swelling.  She did not tolerate hydrochlorothiazide due to leg cramps.  We will try spironolactone 25 mg daily.  Check a basic metabolic panel in a week.  She was encouraged to increase her exercise to 150 minutes weekly and limit sodium intake to 1500 mg a day.  She was given an advance hypertension clinic book.  She was also referred to the PREP program through the [facility].  Continue amlodipine.  She was also given a prescription for furosemide 20 mg to be taken as needed for weight gain or swelling.

## 2020-12-11 NOTE — Patient Instructions (Addendum)
Medication Instructions:  START SPIRONOLACTONE 25 MG DAILY   START FUROSEMIDE 20 MG DAILY AS NEEDED FOR WEIGHT GAIN OF 2-3 POUNDS IN 24 HOURS OR 5 POUNDS IN A WEEK OR SWELLING    Labwork: BMET IN 1 WEEK    Testing/Procedures: NONE   Follow-Up: 01/15/2021 AT 9:30 AM WITH PHARM D   Special Instructions:   MONITOR YOUR BLOOD PRESSURE TWICE A DAY, LOG IN THE BOOK PROVIDED. BRING THE BOOK AND YOUR BLOOD PRESSURE MACHINE TO YOUR FOLLOW UP IN 1 MONTH   DASH Eating Plan DASH stands for "Dietary Approaches to Stop Hypertension." The DASH eating plan is a healthy eating plan that has been shown to reduce high blood pressure (hypertension). It may also reduce your risk for type 2 diabetes, heart disease, and stroke. The DASH eating plan may also help with weight loss. What are tips for following this plan?  General guidelines  Avoid eating more than 2,300 mg (milligrams) of salt (sodium) a day. If you have hypertension, you may need to reduce your sodium intake to 1,500 mg a day.  Limit alcohol intake to no more than 1 drink a day for nonpregnant women and 2 drinks a day for men. One drink equals 12 oz of beer, 5 oz of wine, or 1 oz of hard liquor.  Work with your health care provider to maintain a healthy body weight or to lose weight. Ask what an ideal weight is for you.  Get at least 30 minutes of exercise that causes your heart to beat faster (aerobic exercise) most days of the week. Activities may include walking, swimming, or biking.  Work with your health care provider or diet and nutrition specialist (dietitian) to adjust your eating plan to your individual calorie needs. Reading food labels   Check food labels for the amount of sodium per serving. Choose foods with less than 5 percent of the Daily Value of sodium. Generally, foods with less than 300 mg of sodium per serving fit into this eating plan.  To find whole grains, look for the word "whole" as the first word in the  ingredient list. Shopping  Buy products labeled as "low-sodium" or "no salt added."  Buy fresh foods. Avoid canned foods and premade or frozen meals. Cooking  Avoid adding salt when cooking. Use salt-free seasonings or herbs instead of table salt or sea salt. Check with your health care provider or pharmacist before using salt substitutes.  Do not fry foods. Cook foods using healthy methods such as baking, boiling, grilling, and broiling instead.  Cook with heart-healthy oils, such as olive, canola, soybean, or sunflower oil. Meal planning  Eat a balanced diet that includes: ? 5 or more servings of fruits and vegetables each day. At each meal, try to fill half of your plate with fruits and vegetables. ? Up to 6-8 servings of whole grains each day. ? Less than 6 oz of lean meat, poultry, or fish each day. A 3-oz serving of meat is about the same size as a deck of cards. One egg equals 1 oz. ? 2 servings of low-fat dairy each day. ? A serving of nuts, seeds, or beans 5 times each week. ? Heart-healthy fats. Healthy fats called Omega-3 fatty acids are found in foods such as flaxseeds and coldwater fish, like sardines, salmon, and mackerel.  Limit how much you eat of the following: ? Canned or prepackaged foods. ? Food that is high in trans fat, such as fried foods. ? Food that  is high in saturated fat, such as fatty meat. ? Sweets, desserts, sugary drinks, and other foods with added sugar. ? Full-fat dairy products.  Do not salt foods before eating.  Try to eat at least 2 vegetarian meals each week.  Eat more home-cooked food and less restaurant, buffet, and fast food.  When eating at a restaurant, ask that your food be prepared with less salt or no salt, if possible. What foods are recommended? The items listed may not be a complete list. Talk with your dietitian about what dietary choices are best for you. Grains Whole-grain or whole-wheat bread. Whole-grain or whole-wheat  pasta. Brown rice. [place]. Bulgur. Whole-grain and low-sodium cereals. Pita bread. Low-fat, low-sodium crackers. Whole-wheat flour tortillas. Vegetables Fresh or frozen vegetables (raw, steamed, roasted, or grilled). Low-sodium or reduced-sodium tomato and vegetable juice. Low-sodium or reduced-sodium tomato sauce and tomato paste. Low-sodium or reduced-sodium canned vegetables. Fruits All fresh, dried, or frozen fruit. Canned fruit in natural juice (without added sugar). Meat and other protein foods Skinless chicken or [place]. Ground chicken or [place]. Pork with fat trimmed off. Fish and seafood. Egg whites. Dried beans, peas, or lentils. Unsalted nuts, nut butters, and seeds. Unsalted canned beans. Lean cuts of beef with fat trimmed off. Low-sodium, lean deli meat. Dairy Low-fat (1%) or fat-free (skim) milk. Fat-free, low-fat, or reduced-fat cheeses. Nonfat, low-sodium ricotta or cottage cheese. Low-fat or nonfat yogurt. Low-fat, low-sodium cheese. Fats and oils Soft margarine without trans fats. Vegetable oil. Low-fat, reduced-fat, or light mayonnaise and salad dressings (reduced-sodium). Canola, safflower, olive, soybean, and sunflower oils. Avocado. Seasoning and other foods Herbs. Spices. Seasoning mixes without salt. Unsalted popcorn and pretzels. Fat-free sweets. What foods are not recommended? The items listed may not be a complete list. Talk with your dietitian about what dietary choices are best for you. Grains Baked goods made with fat, such as croissants, muffins, or some breads. Dry pasta or rice meal packs. Vegetables Creamed or fried vegetables. Vegetables in a cheese sauce. Regular canned vegetables (not low-sodium or reduced-sodium). Regular canned tomato sauce and paste (not low-sodium or reduced-sodium). Regular tomato and vegetable juice (not low-sodium or reduced-sodium). Angie Fava. Olives. Fruits Canned fruit in a light or heavy syrup. Fried fruit. Fruit in cream or  butter sauce. Meat and other protein foods Fatty cuts of meat. Ribs. Fried meat. Berniece Salines. Sausage. Bologna and other processed lunch meats. Salami. Fatback. Hotdogs. Bratwurst. Salted nuts and seeds. Canned beans with added salt. Canned or smoked fish. Whole eggs or egg yolks. Chicken or [place] with skin. Dairy Whole or 2% milk, cream, and half-and-half. Whole or full-fat cream cheese. Whole-fat or sweetened yogurt. Full-fat cheese. Nondairy creamers. Whipped toppings. Processed cheese and cheese spreads. Fats and oils Butter. Stick margarine. Lard. Shortening. Ghee. Bacon fat. Tropical oils, such as coconut, palm kernel, or palm oil. Seasoning and other foods Salted popcorn and pretzels. Onion salt, garlic salt, seasoned salt, table salt, and sea salt. Worcestershire sauce. Tartar sauce. Barbecue sauce. Teriyaki sauce. Soy sauce, including reduced-sodium. Steak sauce. Canned and packaged gravies. Fish sauce. Oyster sauce. Cocktail sauce. Horseradish that you find on the shelf. Ketchup. Mustard. Meat flavorings and tenderizers. Bouillon cubes. Hot sauce and Tabasco sauce. Premade or packaged marinades. Premade or packaged taco seasonings. Relishes. Regular salad dressings. Where to find more information:  National Heart, Lung, and [place]: https://wilson-eaton.com/  American Heart Association: www.heart.org Summary  The DASH eating plan is a healthy eating plan that has been shown to reduce high blood pressure (hypertension).  It may also reduce your risk for type 2 diabetes, heart disease, and stroke.  With the DASH eating plan, you should limit salt (sodium) intake to 2,300 mg a day. If you have hypertension, you may need to reduce your sodium intake to 1,500 mg a day.  When on the DASH eating plan, aim to eat more fresh fruits and vegetables, whole grains, lean proteins, low-fat dairy, and heart-healthy fats.  Work with your health care provider or diet and nutrition specialist (dietitian) to  adjust your eating plan to your individual calorie needs. This information is not intended to replace advice given to you by your health care provider. Make sure you discuss any questions you have with your health care provider. Document Released: 06/26/2011 Document Revised: 06/19/2017 Document Reviewed: 06/30/2016 Elsevier Patient Education  2020 [employer].

## 2020-12-13 ENCOUNTER — Telehealth: Payer: Self-pay

## 2020-12-15 NOTE — Telephone Encounter (Signed)
Left voicemail asking for return call to discuss PREP program at [facility]

## 2020-12-19 ENCOUNTER — Telehealth: Payer: Self-pay

## 2020-12-20 NOTE — Telephone Encounter (Signed)
Called to discuss PREP program, availability and interest. She plans to join T/Th class at [facility] starting 3rd week of June 1-2:15pm. Will call to schedule assessment visit week of June 13.

## 2020-12-25 ENCOUNTER — Telehealth: Payer: Self-pay

## 2020-12-25 NOTE — Telephone Encounter (Signed)
Left voicemail to return call to set up PREP Assessment visit week of June 13.

## 2020-12-25 NOTE — Telephone Encounter (Signed)
Received call, PREP Assessment visit scheduled for June 13 at 1:30 at [facility]; To begin PREP classes June 21, every Tues/Thurs 12:30-1:45 at [facility]

## 2020-12-31 NOTE — Progress Notes (Signed)
[place] Report   Patient Details  Name: Nicole Burns MRN: [id number] Date of Birth: 11-02-1975 Age: 45 y.o. PCP: [name], MD  Vitals:   12/31/20 1507  BP: (!) 160/98  Pulse: 87  SpO2: 99%  Weight: 258 lb 3.2 oz (117.1 kg)      [facility] Eval - 12/31/20 1500       Referral    Referring Provider [place]/HTN    Reason for referral Hypertension;Inactivity;Obesitity/Overweight    Program Start Date 01/08/21   Every Tues/Thurs 12:30-1:45     Measurement   Waist Circumference 50.5 inches    Hip Circumference 54 inches    Body fat 47.7 percent      Information for Trainer   Goals --   Wants BP = or < 120/80; learn to eat more nutritious meals; wants to lose 20 lbs by the of 12 wk program   Current Exercise none    Orthopedic Concerns NA    Pertinent Medical History HTN, prediabetes    Current Barriers family    Restrictions/Precautions --   NA     Timed Up and Go (TUGS)   Timed Up and Go Low risk <9 seconds      Mobility and Daily Activities   I find it easy to walk up or down two or more flights of stairs. 3    I have no trouble taking out the trash. 4    I do housework such as vacuuming and dusting on my own without difficulty. 4    I can easily lift a gallon of milk (8lbs). 4    I can easily walk a mile. 3    I have no trouble reaching into high cupboards or reaching down to pick up something from the floor. 1    I do not have trouble doing out-door work such as Armed forces logistics/support/administrative officer, raking leaves, or gardening. 1      Mobility and Daily Activities   I feel younger than my age. 4    I feel independent. 4    I feel energetic. 2    I live an active life.  3    I feel strong. 4    I feel healthy. 3    I feel active as other people my age. 2      How fit and strong are you.   Fit and Strong Total Score 42            Past Medical History:  Diagnosis Date   Abortion, missed 08/09/2015   Fatty liver    Gallbladder polyp    Hepatic  cyst    History of chicken pox    Hypertension    Migraine    After menses cycle.    Umbilical hernia 0160   Past Surgical History:  Procedure Laterality Date   CESAREAN SECTION     1993 & 2003   DILATION AND EVACUATION N/A 08/01/2015   Procedure: DILATATION AND EVACUATION With Tissue Sent For Chromosomal Analysis;  Surgeon: Servando Salina, MD;  Location: [facility];  Service: Gynecology;  Laterality: N/A;   HERNIA REPAIR  2007   OPERATIVE ULTRASOUND N/A 08/01/2015   Procedure: OPERATIVE ULTRASOUND;  Surgeon: Servando Salina, MD;  Location: [facility];  Service: Gynecology;  Laterality: N/A;   Social History   Tobacco Use  Smoking Status Never  Smokeless Tobacco Never      Kendalynn Wideman B Tyia Binford 12/31/2020, 3:21 PM

## 2021-01-08 NOTE — Progress Notes (Signed)
[facility] PREP Weekly Session   Patient Details  Name: Nicole Burns MRN: [id number] Date of Birth: 07-03-1976 Age: 45 y.o. PCP: [name], MD  There were no vitals filed for this visit.   [facility] Weekly seesion - 01/08/21 1600       Weekly Session   Topic Discussed --   First meeting: introductions, distribution and review PREP book/program, tour of facility   Classes attended to date [place] 01/08/2021, 4:04 PM

## 2021-01-15 ENCOUNTER — Telehealth: Payer: Self-pay

## 2021-01-15 ENCOUNTER — Ambulatory Visit: Payer: Managed Care, Other (non HMO)

## 2021-01-15 NOTE — Progress Notes (Signed)
[facility] PREP Weekly Session   Patient Details  Name: Nicole Burns MRN: [id number] Date of Birth: [birth year] Age: 45 y.o. PCP: [name], MD  Vitals:   01/15/21 1504  Weight: 257 lb 3.2 oz (116.7 kg)     [facility] Weekly seesion - 01/15/21 1500       Weekly Session   Topic Discussed Importance of resistance training;Other ways to be active    Minutes exercised this week 270 minutes    Classes attended to date 2              [place] 01/15/2021, 3:05 PM

## 2021-01-15 NOTE — Progress Notes (Deleted)
Patient ID: Nicole Burns                 DOB: 1976-06-24                      MRN: [id number]     HPI: Nicole Burns is a 45 y.o. female referred by Dr. Oval Linsey to HTN clinic. PMH includes hypertension, diabetes, obesity, and migraine. Reports history of hypertension since age 75. Patient was referred ot PREP program during last OV with Dr. Oval Linsey. Encouraged to increase exercise to 150 minutes per week, and limits sodium intake to 1500 mg/day.   Current HTN meds:  Amlodipine 5mg  daily Furosemide 20mg  daily as needed for swelling Spironolactone 25mg  daily  Previously tried:  Lisinopril - lip swelling and SOB HCTZ - leg cramps, increased fatigue Spironolactone - unsure of reason to stop Labetalol??  BP goal: <130/80  Family History: The patient's family history includes Cancer in her maternal grandmother; Diabetes in her father and paternal grandmother; Heart attack in her father; Heart disease in her father; Hyperlipidemia in her father and maternal grandmother; Hypertension in her father, mother, and sister; Kidney disease in her paternal grandmother; Stroke in her father.   Social History: rare alcohol consuption, limits diet-coke to nomore than 2 per day,   Diet: some home cooked meals and take out  Exercise: activities of daily living  Home BP readings:   Wt Readings from Last 3 Encounters:  12/31/20 258 lb 3.2 oz (117.1 kg)  12/11/20 260 lb 6.4 oz (118.1 kg)  07/18/19 261 lb 6.4 oz (118.6 kg)   BP Readings from Last 3 Encounters:  12/31/20 (!) 160/98  12/11/20 (!) 194/110  07/18/19 140/90   Pulse Readings from Last 3 Encounters:  12/31/20 87  12/11/20 90  07/13/19 89    Renal function: CrCl cannot be calculated (Patient's most recent lab result is older than the maximum 21 days allowed.).  Past Medical History:  Diagnosis Date   Abortion, missed 08/09/2015   Fatty liver    Gallbladder polyp    Hepatic cyst    History of chicken pox    Hypertension     Migraine    After menses cycle.    Umbilical hernia 5176    Current Outpatient Medications on File Prior to Visit  Medication Sig Dispense Refill   amLODipine (NORVASC) 5 MG tablet TAKE 1 TABLET BY MOUTH EVERY DAY 30 tablet 5   ASHWAGANDHA PO Take by mouth.     Ferrous Sulfate (IRON) 325 (65 Fe) MG TABS Take 1 tablet (325 mg total) by mouth daily. 60 tablet 0   furosemide (LASIX) 20 MG tablet DAILY AS NEEDED FOR WEIGHT GAIN OR SWELLING 30 tablet 3   spironolactone (ALDACTONE) 25 MG tablet Take 1 tablet (25 mg total) by mouth daily. 90 tablet 3   No current facility-administered medications on file prior to visit.    Allergies  Allergen Reactions   Lisinopril Shortness Of Breath   Maxzide [Hydrochlorothiazide W-Triamterene] Other (See Comments)    Joint pain,  Malaise and bad mood   Vicodin [Hydrocodone-Acetaminophen] Palpitations    There were no vitals taken for this visit.  No problem-specific Assessment & Plan notes found for this encounter.   Sydell Prowell Rodriguez-Guzman PharmD, BCPS, [place] [address] [place] [address] 01/15/2021 9:17 AM

## 2021-01-15 NOTE — Telephone Encounter (Signed)
Lmom to r/s missed appt  

## 2021-01-30 ENCOUNTER — Telehealth: Payer: Self-pay

## 2021-01-30 ENCOUNTER — Encounter: Payer: Managed Care, Other (non HMO) | Admitting: Internal Medicine

## 2021-01-30 NOTE — Telephone Encounter (Signed)
Called pt to discuss attendance in PREP, she has a job now working afternoons so class time at 34:19 has conflicted with her work. I offered switching to the next PREP class starting July 19 from 8am-9:15 and she has accepted. Asked her contact me on Monday to confirm attendance and plan to attend first class on Tuesday at 8.

## 2021-02-05 NOTE — Progress Notes (Signed)
[facility] PREP Weekly Session   Patient Details  Name: Nicole Burns MRN: [id number] Date of Birth: Mar 26, 1976 Age: 45 y.o. PCP: [name], MD  There were no vitals filed for this visit.   [facility] Weekly seesion - 02/05/21 1000       Weekly Session   Topic Discussed Goal setting and welcome to the program    Classes attended to date 1            Restarting PREP, work scheduling conflicts with previous class; Tour of facility, introduction to cardio machines  Nicole Burns 02/05/2021, 10:34 AM

## 2021-02-07 ENCOUNTER — Other Ambulatory Visit: Payer: Self-pay

## 2021-02-07 ENCOUNTER — Ambulatory Visit (INDEPENDENT_AMBULATORY_CARE_PROVIDER_SITE_OTHER): Payer: Managed Care, Other (non HMO) | Admitting: Pharmacist

## 2021-02-07 VITALS — BP 152/88 | HR 78 | Resp 14 | Ht 63.0 in | Wt 260.8 lb

## 2021-02-07 DIAGNOSIS — I10 Essential (primary) hypertension: Secondary | ICD-10-CM | POA: Diagnosis not present

## 2021-02-07 MED ORDER — AMLODIPINE BESYLATE 10 MG PO TABS: 10.0000 mg | ORAL_TABLET | Freq: Every day | ORAL | 1 refills | Status: DC

## 2021-02-07 NOTE — Patient Instructions (Addendum)
It was nice meeting you today!  We would like your blood pressure to be less than 130/80  We will check your lab work today since starting the spironolactone  We will increase your amlodipine to 10mg  once a day  We recommend the Omron Brand Blood Pressure cuffs Series 3 or higher, or Bronze or higher  Continue to watch your salt intake and continue physical activity  We will contact you tomorrow with your results  Karren Cobble, PharmD, BCACP, [place], [place] [address] [place], [place] [address] Phone: [phone]; Fax: [phone] 02/07/2021 10:56 AM

## 2021-02-07 NOTE — Progress Notes (Signed)
Patient ID: Nicole Burns                 DOB: [birth year]                      MRN: [id number]     HPI: Nicole Burns is a 45 y.o. female referred by Dr. Oval Linsey to HTN clinic. PMH is significant for HTN, obesity, and anemia.   At last visit with Dr. Oval Linsey, patient BP elevated at 194/110 and patient was started on spironolactone.  Was instructed to check labs in 1 week but she forgot.  Enrolled in PREP classes which she started in June.  Patient presents today in good spirits.  Has not been checking her blood pressure at home because she does not think her machine works.  Currently managed on amlodipine and spironolactone.  Had a possible allergic reaction to lisinopril.  One evening had difficulty breathing and swollen lips.  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  Reports she is on her feet and more active now.  Daughter has moved home for the Summer and will be attending [employer] in the Fall and this transition has caused some stress and anxiety.    Is trying to watch salt intake but reports it will be easier when daughter leaves for school.  Reports medication compliance. Drinks 1 Coke Zero a day.  Of note, patient has DM listed on problem list however patient does not believe she has ever been formally diagnosed and A1c levels have been consistently in pre diabetic range.  Current HTN meds: amlodipine 5mg , spironolactone 25mg  daily,  Previously tried: lisinopril BP goal: <130/80   Wt Readings from Last 3 Encounters:  01/15/21 257 lb 3.2 oz (116.7 kg)  12/31/20 258 lb 3.2 oz (117.1 kg)  12/11/20 260 lb 6.4 oz (118.1 kg)   BP Readings from Last 3 Encounters:  12/31/20 (!) 160/98  12/11/20 (!) 194/110  07/18/19 140/90   Pulse Readings from Last 3 Encounters:  12/31/20 87  12/11/20 90  07/13/19 89    Renal function: CrCl cannot be calculated (Patient's most recent lab result is older  than the maximum 21 days allowed.).  Past Medical History:  Diagnosis Date   Abortion, missed 08/09/2015   Fatty liver    Gallbladder polyp    Hepatic cyst    History of chicken pox    Hypertension    Migraine    After menses cycle.    Umbilical hernia 9563    Current Outpatient Medications on File Prior to Visit  Medication Sig Dispense Refill   amLODipine (NORVASC) 5 MG tablet TAKE 1 TABLET BY MOUTH EVERY DAY 30 tablet 5   ASHWAGANDHA PO Take by mouth.     Ferrous Sulfate (IRON) 325 (65 Fe) MG TABS Take 1 tablet (325 mg total) by mouth daily. 60 tablet 0   furosemide (LASIX) 20 MG tablet DAILY AS NEEDED FOR WEIGHT GAIN OR SWELLING 30 tablet 3   spironolactone (ALDACTONE) 25 MG tablet Take 1 tablet (25 mg total) by mouth daily. 90 tablet 3   No current facility-administered medications on file prior to visit.    Allergies  Allergen Reactions   Lisinopril Shortness Of Breath   Maxzide [Hydrochlorothiazide W-Triamterene] Other (See Comments)    Joint pain,  Malaise and bad mood   Vicodin [Hydrocodone-Acetaminophen] Palpitations     Assessment/Plan:  1. Hypertension -  Patient BP in room today 152/88  which is above goal of <130/80 although improved since last visit.    Patient did not have labs checked since starting spironolactone so will have her have them checked today. Patient also requests A1c be checked.    Gave recommendations on blood pressure cuffs and encouraged her to begin checking BP at home and keeping logs.  Due to BP above goal, will increase amlodipine to 10mg  once daily and recheck in 4 weeks.  Patient voiced understanding.  Check CMP and A1c Increase amlodipine to 10mg  daily Continue spironolactone 25mg  daily pending todays labs Recheck in 4 weeks  Karren Cobble, PharmD, BCACP, [place], [place] [address], [place], [place] [address] Phone: [phone]; Fax: [phone] 02/07/2021 3:12 PM

## 2021-02-08 LAB — COMPREHENSIVE METABOLIC PANEL
ALT: 11 IU/L (ref 0–32)
AST: 15 IU/L (ref 0–40)
Albumin/Globulin Ratio: 1.3 (ref 1.2–2.2)
Albumin: 4.2 g/dL (ref 3.8–4.8)
Alkaline Phosphatase: 72 IU/L (ref 44–121)
BUN/Creatinine Ratio: 12 (ref 9–23)
BUN: 10 mg/dL (ref 6–24)
Bilirubin Total: 0.3 mg/dL (ref 0.0–1.2)
CO2: 20 mmol/L (ref 20–29)
Calcium: 9.9 mg/dL (ref 8.7–10.2)
Chloride: 102 mmol/L (ref 96–106)
Creatinine, Ser: 0.86 mg/dL (ref 0.57–1.00)
Globulin, Total: 3.3 g/dL (ref 1.5–4.5)
Glucose: 119 mg/dL — ABNORMAL HIGH (ref 65–99)
Potassium: 4.4 mmol/L (ref 3.5–5.2)
Sodium: 135 mmol/L (ref 134–144)
Total Protein: 7.5 g/dL (ref 6.0–8.5)
eGFR: 84 mL/min/{1.73_m2} (ref >59)

## 2021-02-08 LAB — HEMOGLOBIN A1C
Est. average glucose Bld gHb Est-mCnc: 154 mg/dL
Hgb A1c MFr Bld: 7 % — ABNORMAL HIGH (ref 4.8–5.6)

## 2021-02-12 ENCOUNTER — Encounter: Payer: Self-pay | Admitting: Internal Medicine

## 2021-02-12 ENCOUNTER — Ambulatory Visit (INDEPENDENT_AMBULATORY_CARE_PROVIDER_SITE_OTHER): Payer: Managed Care, Other (non HMO) | Admitting: Internal Medicine

## 2021-02-12 ENCOUNTER — Other Ambulatory Visit: Payer: Self-pay

## 2021-02-12 DIAGNOSIS — E1169 Type 2 diabetes mellitus with other specified complication: Secondary | ICD-10-CM

## 2021-02-12 DIAGNOSIS — K760 Fatty (change of) liver, not elsewhere classified: Secondary | ICD-10-CM

## 2021-02-12 DIAGNOSIS — E669 Obesity, unspecified: Secondary | ICD-10-CM | POA: Diagnosis not present

## 2021-02-12 DIAGNOSIS — Z1211 Encounter for screening for malignant neoplasm of colon: Secondary | ICD-10-CM

## 2021-02-12 MED ORDER — OZEMPIC (0.25 OR 0.5 MG/DOSE) 2 MG/1.5ML ~~LOC~~ SOPN: 0.2500 mg | PEN_INJECTOR | SUBCUTANEOUS | 2 refills | Status: DC

## 2021-02-12 MED ORDER — OZEMPIC (0.25 OR 0.5 MG/DOSE) 2 MG/1.5ML ~~LOC~~ SOPN: 0.5000 mg | PEN_INJECTOR | SUBCUTANEOUS | 2 refills | Status: DC

## 2021-02-12 MED ORDER — GLUCOSE BLOOD VI STRP: ORAL_STRIP | 2 refills | Status: DC

## 2021-02-12 NOTE — Progress Notes (Signed)
[facility] PREP Weekly Session   Patient Details  Name: Nicole Burns MRN: [id number] Date of Birth: 1976-06-24 Age: 45 y.o. PCP: [name], MD  Vitals:   02/12/21 0946  Weight: 256 lb (116.1 kg)     [facility] Weekly seesion - 02/12/21 0900       Weekly Session   Topic Discussed Importance of resistance training;Water    Minutes exercised this week 30 minutes    Classes attended to date 2              Yevonne Aline 02/12/2021, 9:46 AM

## 2021-02-12 NOTE — Assessment & Plan Note (Signed)
Current liver enzymes are normal and all modifiable risk factors including obesity, diabetes and hyperlipidemia have been addressed .  She has received vaccines for hepatitis A and B in the past.   Lab Results  Component Value Date   ALT 11 02/07/2021   AST 15 02/07/2021   ALKPHOS 72 02/07/2021   BILITOT 0.3 02/07/2021

## 2021-02-12 NOTE — Progress Notes (Signed)
Patient ID: Nicole Burns, female    DOB: [birth year]  Age: 45 y.o. MRN: [id number]  Subjective:  Patient ID: Nicole Burns, female    DOB: 1975-11-12  Age: 45 y.o. MRN: [id number]  CC: Diagnoses of Diabetes mellitus type 2 in obese ([place]) and Hepatic steatosis were pertinent to this visit.  HPI [name] presents for discussion of type 2 DM.   This visit occurred during the SARS-CoV-2 public health emergency.  Safety protocols were in place, including screening questions prior to the visit, additional usage of staff PPE, and extensive cleaning of exam room while observing appropriate contact time as indicated for disinfecting solutions.   Recently had a1c of  7.0  at her cardiologist's office,  she denies a history of diabetes although she was diagnosed  by me in 2016 with a fasting sugar of 190 and has not followed up or started metformin.   She has a family history of DM.  She is distrustful of metformin  and  has deferred medication.  She is interested in losing weight     Outpatient Medications Prior to Visit  Medication Sig Dispense Refill   amLODipine (NORVASC) 10 MG tablet Take 1 tablet (10 mg total) by mouth daily. 90 tablet 1   Ferrous Sulfate (IRON) 325 (65 Fe) MG TABS Take 1 tablet (325 mg total) by mouth daily. 60 tablet 0   furosemide (LASIX) 20 MG tablet DAILY AS NEEDED FOR WEIGHT GAIN OR SWELLING 30 tablet 3   spironolactone (ALDACTONE) 25 MG tablet Take 1 tablet (25 mg total) by mouth daily. 90 tablet 3   ASHWAGANDHA PO Take by mouth. (Patient not taking: Reported on 02/12/2021)     No facility-administered medications prior to visit.    Review of Systems;  Patient denies headache, fevers, malaise, unintentional weight loss, skin rash, eye pain, sinus congestion and sinus pain, sore throat, dysphagia,  hemoptysis , cough, dyspnea, wheezing, chest pain, palpitations, orthopnea, edema, abdominal pain, nausea, melena, diarrhea, constipation, flank pain, dysuria,  hematuria, urinary  Frequency, nocturia, numbness, tingling, seizures,  Focal weakness, Loss of consciousness,  Tremor, insomnia, depression, anxiety, and suicidal ideation.      Objective:  BP 128/90 (BP Location: Left Arm, Patient Position: Sitting, Cuff Size: Large)   Pulse 80   Temp (!) 95.7 F (35.4 C) (Temporal)   Resp 15   Ht '5\' 3"'$  (1.6 m)   Wt 257 lb 6.4 oz (116.8 kg)   LMP 01/15/2021   SpO2 99%   BMI 45.60 kg/m   BP Readings from Last 3 Encounters:  02/12/21 128/90  02/07/21 (!) 152/88  12/31/20 (!) 160/98    Wt Readings from Last 3 Encounters:  02/12/21 257 lb 6.4 oz (116.8 kg)  02/12/21 256 lb (116.1 kg)  02/07/21 260 lb 12.8 oz (118.3 kg)    General appearance: alert, cooperative and appears stated age Ears: normal TM's and external ear canals both ears Throat: lips, mucosa, and tongue normal; teeth and gums normal Neck: no adenopathy, no carotid bruit, supple, symmetrical, trachea midline and thyroid not enlarged, symmetric, no tenderness/mass/nodules Back: symmetric, no curvature. ROM normal. No CVA tenderness. Lungs: clear to auscultation bilaterally Heart: regular rate and rhythm, S1, S2 normal, no murmur, click, rub or gallop Abdomen: soft, non-tender; bowel sounds normal; no masses,  no organomegaly Pulses: 2+ and symmetric Skin: Skin color, texture, turgor normal. No rashes or lesions Lymph nodes: Cervical, supraclavicular, and axillary nodes normal.  Lab Results  Component Value Date   HGBA1C 7.0 (  H) 02/07/2021   HGBA1C 5.5 08/18/2019   HGBA1C 5.7 05/18/2017    Lab Results  Component Value Date   CREATININE 0.86 02/07/2021   CREATININE 0.92 08/18/2019   CREATININE 0.95 07/13/2019    Lab Results  Component Value Date   WBC 8.5 09/21/2019   HGB 9.3 Repeated and verified X2. (L) 09/21/2019   HCT 30.2 (L) 09/21/2019   PLT 363.0 09/21/2019   GLUCOSE 119 (H) 02/07/2021   CHOL 140 08/18/2019   TRIG 64.0 08/18/2019   HDL 39.90 08/18/2019    LDLCALC 87 08/18/2019   ALT 11 02/07/2021   AST 15 02/07/2021   NA 135 02/07/2021   K 4.4 02/07/2021   CL 102 02/07/2021   CREATININE 0.86 02/07/2021   BUN 10 02/07/2021   CO2 20 02/07/2021   TSH 2.05 08/18/2019   HGBA1C 7.0 (H) 02/07/2021   MICROALBUR 6.6 (H) 08/18/2019    No results found.  Assessment & Plan:   Problem List Items Addressed This Visit       Unprioritized   Diabetes mellitus type 2 in obese [facility])    She is tearful today about the diagnosis due to family members being affected.  Has been referred to the Heart Clinic's group for patients with hypertension .  Metformin refused by patient due to  hearing that it was "bad on your kidneys"     . Willing to start Ozempic however,         Relevant Medications   Semaglutide,0.25 or 0.'5MG'$ /DOS, (OZEMPIC, 0.25 OR 0.5 MG/DOSE,) 2 MG/1.5ML SOPN   Other Relevant Orders   AMB Referral to [facility]   Hepatic steatosis    Current liver enzymes are normal and all modifiable risk factors including obesity, diabetes and hyperlipidemia have been addressed .  She has received vaccines for hepatitis A and B in the past.   Lab Results  Component Value Date   ALT 11 02/07/2021   AST 15 02/07/2021   ALKPHOS 72 02/07/2021   BILITOT 0.3 02/07/2021         I have changed [name]'s Ozempic (0.25 or 0.5 MG/DOSE). I am also having her start on glucose blood. Additionally, I am having her maintain her Iron, ASHWAGANDHA PO, furosemide, spironolactone, and amLODipine.  Meds ordered this encounter  Medications   DISCONTD: Semaglutide,0.25 or 0.'5MG'$ /DOS, (OZEMPIC, 0.25 OR 0.5 MG/DOSE,) 2 MG/1.5ML SOPN    Sig: Inject 0.5 mg into the skin once a week.    Dispense:  1.5 mL    Refill:  2   Semaglutide,0.25 or 0.'5MG'$ /DOS, (OZEMPIC, 0.25 OR 0.5 MG/DOSE,) 2 MG/1.5ML SOPN    Sig: Inject 0.25 mg into the skin once a week.    Dispense:  1.5 mL    Refill:  2   glucose blood test strip    Sig: Use to check blood  sugars once daily.    Dispense:  100 each    Refill:  2   A total of 40 minutes was spent with patient more than half of which was spent in counseling patient on the above mentioned issues , reviewing and explaining recent labs and imaging studies done, and coordination of care.  Medications Discontinued During This Encounter  Medication Reason   Semaglutide,0.25 or 0.'5MG'$ /DOS, (OZEMPIC, 0.25 OR 0.5 MG/DOSE,) 2 MG/1.5ML SOPN Reorder    Follow-up: Return in about 3 months (around 05/15/2021) for follow up diabetes.   [name], MD

## 2021-02-12 NOTE — Assessment & Plan Note (Addendum)
She is tearful today about the diagnosis due to family members being affected.  Has been referred to the [facility] Clinic's group for patients with hypertension .  Metformin refused by patient due to  hearing that it was "bad on your kidneys"     . Willing to start [place] however,

## 2021-02-12 NOTE — Patient Instructions (Signed)
I am  recommending adding Ozempic  to help you lose weight  and manage your diabetes.    Each pen  contains 6  WEEKLY doses at the starting dose of 0.25 mg     ozempic is a medication that is taken as a weekly subcutaneous injection. It is not insulin.  It  causes your pancreas to increase its  own insulin secretion  And also slows down the emptying of your stomach,  So it decreases your appetite and helps you lose weight.  The dose for the first 4 weekly doses is 0.25 mg.  You may have mild nausea on the first or second day but this should resolve.  If not  ,  stop the medication.   As long as you are losing weight,  you can continue the dose you are on .  Only increase the dose to 0.5 mg after 4 weeks if your weight has plateaued.  Let me know when you need a refill and what dose you are taking.    Check your blood sugar not more than once daily,  at variable times Here are the important times :  1) fasting     2) 2 hours after a meal (you can vary from day to day which meal you check)   I am letting you know that I am referring to our clinical pharmacist ,  Catie Darnelle Maffucci.  Catie helps me provide additional services to my patients who are dealing with  chronic diseases , like diabetes.  I do not expect this referral to cost you anything out of pocket, but I do think Catie will be able to help you get your diabetes under control and maximize your drug benefits.  She will make contact with  You by phone in the next week

## 2021-02-13 ENCOUNTER — Other Ambulatory Visit: Payer: Self-pay | Admitting: Internal Medicine

## 2021-02-13 ENCOUNTER — Telehealth: Payer: Self-pay

## 2021-02-13 NOTE — Chronic Care Management (AMB) (Signed)
  Care Management   Outreach Note  02/13/2021 Name: Nicole Burns MRN: [id number] DOB: January 03, 1976  Referred by: [name], MD Reason for referral : Care Coordination (Outreach to schedule referral with Pharm D )   An unsuccessful telephone outreach was attempted today. The patient was referred to the case management team for assistance with care management and care coordination.   Follow Up Plan: A HIPAA compliant phone message was left for the patient providing contact information and requesting a return call.  The care management team will reach out to the patient again over the next 3 days.  If patient returns call to provider office, please advise to call [facility] [facility] at [place], [place], [place], [place] [address] Direct Dial: [phone] [email or web address] Website: [email or web address]

## 2021-02-14 NOTE — Chronic Care Management (AMB) (Signed)
  Care Management   Note  02/14/2021 Name: Hidi Lanser MRN: [id number] DOB: 1976-03-26  Chonita Lindblom is a 45 y.o. year old female who is a primary care patient of Derrel Nip, Aris Everts, MD. I reached out to [name] by phone today in response to a referral sent by Ms. Hatsue Bissonette's health plan.    Ms. [place] was given information about care management services today including:  Care management services include personalized support from designated clinical staff supervised by her physician, including individualized plan of care and coordination with other care providers 24/7 contact phone numbers for assistance for urgent and routine care needs. The patient may stop care management services at any time by phone call to the office staff.  Patient agreed to services and verbal consent obtained.   Follow up plan: Telephone appointment with care management team member scheduled for:02/26/2021  Noreene Larsson, [place], [place], [place] [address] Direct Dial: [phone] [email or web address] Website: [email or web address]

## 2021-02-19 NOTE — Progress Notes (Signed)
[facility] PREP Weekly Session   Patient Details  Name: Nicole Burns MRN: [id number] Date of Birth: 18-Sep-1975 Age: 45 y.o. PCP: [name], MD  Vitals:   02/19/21 1014  Weight: 254 lb 6.4 oz (115.4 kg)     [facility] Weekly seesion - 02/19/21 1000       Weekly Session   Topic Discussed Healthy eating tips    Minutes exercised this week 50 minutes    Classes attended to date [place] 02/19/2021, 10:15 AM

## 2021-02-25 ENCOUNTER — Other Ambulatory Visit: Payer: Self-pay

## 2021-02-25 MED ORDER — GOLYTELY 236 G PO SOLR: 4000.0000 mL | Freq: Once | ORAL | 0 refills | Status: AC

## 2021-02-26 ENCOUNTER — Telehealth: Payer: Self-pay | Admitting: Pharmacist

## 2021-02-26 ENCOUNTER — Telehealth: Payer: Managed Care, Other (non HMO)

## 2021-02-26 NOTE — Telephone Encounter (Signed)
  Chronic Care Management   Note  02/26/2021 Name: Aniko Downing MRN: [id number] DOB: 1976/06/11   Attempted to contact patient for scheduled appointment for medication management support. Left HIPAA compliant message for patient to return my call at their convenience.    Plan: - If I do not hear back from the patient by end of business today, will collaborate with Care Guide to outreach to schedule follow up with me  Catie Darnelle Maffucci, PharmD, [place], [place] Pharmacist [employer] at [employer] [phone]

## 2021-02-26 NOTE — Progress Notes (Signed)
[facility] PREP Weekly Session   Patient Details  Name: Nicole Burns MRN: [id number] Date of Birth: 1976-07-15 Age: 45 y.o. PCP: [name], MD  Vitals:   02/26/21 0939  Weight: 253 lb (114.8 kg)     [facility] Weekly seesion - 02/26/21 0900       Weekly Session   Topic Discussed Health habits   Sugar demo   Minutes exercised this week 55 minutes    Classes attended to date [place] 02/26/2021, 9:41 AM

## 2021-02-26 NOTE — Telephone Encounter (Signed)
Pt called back returning your call °

## 2021-02-27 ENCOUNTER — Telehealth: Payer: Self-pay

## 2021-02-27 NOTE — Chronic Care Management (AMB) (Signed)
  Care Management   Note  02/27/2021 Name: Nicole Burns MRN: [id number] DOB: [birth year]  Nicole Burns is a 45 y.o. year old female who is a primary care patient of Derrel Nip, Aris Everts, MD and is actively engaged with the care management team. I reached out to [name] by phone today to assist with re-scheduling an initial visit with the Pharmacist  Follow up plan: Unsuccessful telephone outreach attempt made. A HIPAA compliant phone message was left for the patient providing contact information and requesting a return call.  The care management team will reach out to the patient again over the next 5 days.  If patient returns call to provider office, please advise to call [facility]  at [place], [place], [place], [place] [address] Direct Dial: [phone] [email or web address] Website: [email or web address]

## 2021-02-28 LAB — HM DIABETES EYE EXAM

## 2021-03-05 NOTE — Chronic Care Management (AMB) (Signed)
  Care Management   Note  03/05/2021 Name: Jamell Koopman MRN: [id number] DOB: [birth year]  Nicole Burns is a 45 y.o. year old female who is a primary care patient of Derrel Nip, Aris Everts, MD and is actively engaged with the care management team. I reached out to [name] by phone today to assist with re-scheduling an initial visit with the Pharmacist  Follow up plan: Telephone appointment with care management team member scheduled for:03/12/2021  Noreene Larsson, [place], [place], [place] [address] Direct Dial: [phone] [email or web address] Website: [email or web address]

## 2021-03-12 ENCOUNTER — Telehealth: Payer: Self-pay | Admitting: Pharmacist

## 2021-03-12 ENCOUNTER — Ambulatory Visit: Payer: Managed Care, Other (non HMO) | Admitting: Pharmacist

## 2021-03-12 DIAGNOSIS — E1169 Type 2 diabetes mellitus with other specified complication: Secondary | ICD-10-CM

## 2021-03-12 DIAGNOSIS — I10 Essential (primary) hypertension: Secondary | ICD-10-CM

## 2021-03-12 MED ORDER — GLUCOSE BLOOD VI STRP: ORAL_STRIP | 3 refills | Status: DC

## 2021-03-12 NOTE — Telephone Encounter (Signed)
Patient returned my call. See CCM documentation

## 2021-03-12 NOTE — Telephone Encounter (Signed)
Patient is returning your call from earlier. 

## 2021-03-12 NOTE — Progress Notes (Signed)
[facility] PREP Weekly Session   Patient Details  Name: Nicole Burns MRN: [id number] Date of Birth: January 08, 1976 Age: 45 y.o. PCP: [name], MD  Vitals:   03/12/21 1057  Weight: 250 lb (113.4 kg)     [facility] Weekly seesion - 03/12/21 1000       Weekly Session   Topic Discussed Stress management and problem solving   Reviewed food labels focusing on added sugars, serving size, sodium, ingredients, added processing ingredients   Minutes exercised this week 40 minutes    Classes attended to date 8           Shared fruit bowl meditation at end of class; discussed CALM app; sent sleep regimen guideline book and DASH diet links via email   [place] 03/12/2021, 10:59 AM

## 2021-03-12 NOTE — Telephone Encounter (Signed)
Called back, call went straight to voicemail. Left voicemail for her to return my call at her convenience.

## 2021-03-12 NOTE — Chronic Care Management (AMB) (Signed)
Care Management   Pharmacy Note  03/12/2021 Name: Nicole Burns MRN: [id number] DOB: 04/01/1976  Subjective: Nicole Burns is a 44 y.o. year old female who is a primary care patient of [name], MD. The Care Management team was consulted for assistance with care management and care coordination needs.    Engaged with patient by telephone for initial visit in response to provider referral for pharmacy case management and/or care coordination services.   The patient was given information about Care Management services today including:  Care Management services includes personalized support from designated clinical staff supervised by the patient's primary care provider, including individualized plan of care and coordination with other care providers. 24/7 contact phone numbers for assistance for urgent and routine care needs. The patient may stop case management services at any time by phone call to the office staff.  Patient agreed to services and consent obtained.  Assessment:  Review of patient status, including review of consultants reports, laboratory and other test data, was performed as part of comprehensive evaluation and provision of chronic care management services.   SDOH (Social Determinants of Health) assessments and interventions performed:  SDOH Interventions    Flowsheet Row Most Recent Value  SDOH Interventions   Financial Strain Interventions Intervention Not Indicated        Objective:  Lab Results  Component Value Date   CREATININE 0.86 02/07/2021   CREATININE 0.92 08/18/2019   CREATININE 0.95 07/13/2019    Lab Results  Component Value Date   HGBA1C 7.0 (H) 02/07/2021       Component Value Date/Time   CHOL 140 08/18/2019 0843   TRIG 64.0 08/18/2019 0843   HDL 39.90 08/18/2019 0843   CHOLHDL 3 08/18/2019 0843   VLDL 12.8 08/18/2019 0843   LDLCALC 87 08/18/2019 0843     Clinical ASCVD: No  The 10-year ASCVD risk score Mikey Bussing DC Jr.,  et al., 2013) is: 7.8%   Values used to calculate the score:     Age: 35 years     Sex: Female     Is Non-Hispanic African American: Yes     Diabetic: Yes     Tobacco smoker: No     Systolic Blood Pressure: 0000000 mmHg     Is BP treated: Yes     HDL Cholesterol: 39.9 mg/dL     Total Cholesterol: 140 mg/dL     BP Readings from Last 3 Encounters:  02/12/21 128/90  02/07/21 (!) 152/88  12/31/20 (!) 160/98    Care Plan  Allergies  Allergen Reactions   Lisinopril Shortness Of Breath   Maxzide [Hydrochlorothiazide W-Triamterene] Other (See Comments)    Joint pain,  Malaise and bad mood   Vicodin [Hydrocodone-Acetaminophen] Palpitations    Medications Reviewed Today     Reviewed by De Hollingshead, RPH-CPP (Pharmacist) on 03/12/21 at 1406  Med List Status: <None>   Medication Order Taking? Sig Documenting Provider Last Dose Status Informant  amLODipine (NORVASC) 10 MG tablet [id number] Yes Take 1 tablet (10 mg total) by mouth daily. Skeet Latch, MD Taking Active   ASHWAGANDHA PO [id number] No Take by mouth.  Patient not taking: No sig reported   [provider] Not Taking Active   Ferrous Sulfate (IRON) 325 (65 Fe) MG TABS [id number] No Take 1 tablet (325 mg total) by mouth daily.  Patient not taking: Reported on 03/12/2021   Carrie Mew, MD Not Taking Active   furosemide (LASIX) 20 MG tablet [id number] No DAILY AS NEEDED FOR  WEIGHT GAIN OR SWELLING  Patient not taking: Reported on 03/12/2021   Skeet Latch, MD Not Taking Active   glucose blood test strip [id number] Yes Use to check blood sugars twice daily [name], MD  Active   HOMEOPATHIC PRODUCTS PO [id number] Yes Take by mouth. MegaFood Blood Builder - vitamin C 15 mg, folic acid A999333 mcg, Vitamin B12 30 mcg, Iron 26 mg [provider] Taking Active   Semaglutide,0.25 or 0.'5MG'$ /DOS, (OZEMPIC, 0.25 OR 0.5 MG/DOSE,) 2 MG/1.5ML SOPN [id number] Yes Inject 0.25 mg into the skin once a week.  [name], MD Taking Active   spironolactone (ALDACTONE) 25 MG tablet [id number] Yes Take 1 tablet (25 mg total) by mouth daily. Skeet Latch, MD Taking Active             Patient Active Problem List   Diagnosis Date Noted   Iron deficiency anemia due to chronic blood loss [id number]   Acute systolic heart failure ([place]) 07/19/2019   Hepatic steatosis 01/17/2015   Abdominal pain 01/17/2015   Encounter for preventive health examination 02/04/2014   Menorrhagia 05/04/2013   Recurrent ventral hernia 0000000   Umbilical hernia    Diabetes mellitus type 2 in obese ([place]) 10/31/2012   Obesity, morbid ([place]) 10/13/2012   Premenstrual symptom 10/13/2012   Essential hypertension, benign 10/13/2012    Conditions to be addressed/monitored: HTN and DMII  Care Plan : Medication Management  Updates made by De Hollingshead, RPH-CPP since 03/12/2021 12:00 AM     Problem: Diabetes, HTN,      Long-Range Goal: Disease Progression Prevention   Start Date: 03/12/2021  This Visit's Progress: On track  Priority: High  Note:   Current Barriers:  Unable to achieve control of diabetes   Pharmacist Clinical Goal(s):  Over the next 90 days, patient will achieve control of diabetes as evidenced by A1c  through collaboration with PharmD and provider.   Interventions: 1:1 collaboration with [name], MD regarding development and update of comprehensive plan of care as evidenced by provider attestation and co-signature Inter-disciplinary care team collaboration (see longitudinal plan of care) Comprehensive medication review performed; medication list updated in electronic medical record  Diabetes: Uncontrolled; current treatment: Ozempic 0.25 mg weekly x 4 weeks Most recent weight: 250 lbs; baseline weight: 257 lbs Current glucose readings: needs new test strips, will send script today Improvement in fatigue, constipation, nausea Current meal patterns: mid-morning: [place]  sausage, egg, green tea; dinner: quick meals if she gets off late, last night was Sonic, sometimes husband will cook vegetables, grilled vegetables; drinks: green tea, misses the carbonation; notes decrease in sweet tooth, cutting back on fried/fatty foods; [facility] to help with lifestyle modification Current exercise: Tues/Thurs - works out at [employer] - cardio on Tuesdays, strength training on Thursdays. Sometimes able to work in other activity, but has an active part time job Educated on goal A1c, goal fasting, goal 2 hour post prandial glucose  Increase Ozempic to 0.5 mg weekly. Counseled on potential for increased GI side effects with dose increase. Patient verbalizes understanding Consider role of Mounjaro moving forward given improved impact on A1c, weight loss in clinical trials vs Ozempic.   Hypertension: Uncontrolled but improving; current treatment: amlodipine 10 mg daily, spironolactone 25 mg daily;  Current home readings: reports last reading ~ 140/80s Diet and physical activity as above Praised for improved BP control. Discussed eventual goal <130/80. Discussed benefit of weight loss w/ Ozempic on BP. Recommended  to continue current regimen along with [facility] as scheduled    Patient Goals/Self-Care Activities Over the next 90 days, patient will:  - take medications as prescribed check glucose daily, document, and provide at future appointments check blood pressure periodically, document, and provide at future appointments  Follow Up Plan: Telephone follow up appointment with care management team member scheduled for: ~ 6 weeks      Medication Assistance:  None required.  Patient affirms current coverage meets needs.  Follow Up:  Patient agrees to Care Plan and Follow-up.  Plan: Telephone follow up appointment with care management team member scheduled for:  ~ 6 weeks  Catie Darnelle Maffucci, PharmD, [place], [place] Clinical Pharmacist [employer] at [employer] [phone]

## 2021-03-12 NOTE — Patient Instructions (Signed)
Visit Information  PATIENT GOALS:   Goals Addressed               This Visit's Progress     Patient Stated     Medication Monitoring (pt-stated)        Patient Goals/Self-Care Activities Over the next 90 days, patient will:  - take medications as prescribed check glucose daily, document, and provide at future appointments check blood pressure periodically, document, and provide at future appointments        Ms. Castrellon was given information about Care Management services by the embedded care coordination team including:  Care Management services include personalized support from designated clinical staff supervised by her physician, including individualized plan of care and coordination with other care providers 24/7 contact phone numbers for assistance for urgent and routine care needs. The patient may stop CCM services at any time (effective at the end of the month) by phone call to the office staff.  Patient agreed to services and verbal consent obtained.   Patient verbalizes understanding of instructions provided today and agrees to view in [place].   Plan: Telephone follow up appointment with care management team member scheduled for:  ~ 6 weeks  Catie Darnelle Maffucci, PharmD, [place], [place] Clinical Pharmacist [employer] at [employer] [phone]

## 2021-03-12 NOTE — Telephone Encounter (Signed)
  Chronic Care Management   Note  03/12/2021 Name: Jali Cockfield MRN: [id number] DOB: April 21, 1976   Attempted to contact patient for scheduled appointment for medication management support. Left HIPAA compliant message for patient to return my call at their convenience.    Plan: - If I do not hear back from the patient by end of business today, will collaborate with Care Guide to outreach to schedule follow up with me  Catie Darnelle Maffucci, PharmD, [place], [place] Pharmacist [employer] at [facility] [employer] [facility] [phone]

## 2021-03-22 ENCOUNTER — Ambulatory Visit: Payer: Managed Care, Other (non HMO)

## 2021-03-26 ENCOUNTER — Ambulatory Visit: Payer: Managed Care, Other (non HMO) | Admitting: Pharmacist

## 2021-03-26 DIAGNOSIS — E1169 Type 2 diabetes mellitus with other specified complication: Secondary | ICD-10-CM

## 2021-03-26 DIAGNOSIS — I10 Essential (primary) hypertension: Secondary | ICD-10-CM

## 2021-03-26 DIAGNOSIS — E669 Obesity, unspecified: Secondary | ICD-10-CM

## 2021-03-26 MED ORDER — OZEMPIC (0.25 OR 0.5 MG/DOSE) 2 MG/1.5ML ~~LOC~~ SOPN: 0.5000 mg | PEN_INJECTOR | SUBCUTANEOUS | 2 refills | Status: DC

## 2021-03-26 NOTE — Chronic Care Management (AMB) (Signed)
Care Management   Pharmacy Note  03/26/2021 Name: Nicole Burns MRN: [id number] DOB: 1976/02/08  Subjective: Nicole Burns is a 45 y.o. year old female who is a primary care patient of [name], MD. The Care Management team was consulted for assistance with care management and care coordination needs.    Care coordination  for  medication access  in response to provider referral for pharmacy case management and/or care coordination services.   The patient was given information about Care Management services today including:  Care Management services includes personalized support from designated clinical staff supervised by the patient's primary care provider, including individualized plan of care and coordination with other care providers. 24/7 contact phone numbers for assistance for urgent and routine care needs. The patient may stop case management services at any time by phone call to the office staff.  Patient agreed to services and consent obtained.  Assessment:  Review of patient status, including review of consultants reports, laboratory and other test data, was performed as part of comprehensive evaluation and provision of chronic care management services.   SDOH (Social Determinants of Health) assessments and interventions performed:  SDOH Interventions    Flowsheet Row Most Recent Value  SDOH Interventions   Financial Strain Interventions Intervention Not Indicated        Objective:  Lab Results  Component Value Date   CREATININE 0.86 02/07/2021   CREATININE 0.92 08/18/2019   CREATININE 0.95 07/13/2019    Lab Results  Component Value Date   HGBA1C 7.0 (H) 02/07/2021       Component Value Date/Time   CHOL 140 08/18/2019 0843   TRIG 64.0 08/18/2019 0843   HDL 39.90 08/18/2019 0843   CHOLHDL 3 08/18/2019 0843   VLDL 12.8 08/18/2019 0843   LDLCALC 87 08/18/2019 0843    Clinical ASCVD: No  The 10-year ASCVD risk score Mikey Bussing DC Jr., et al.,  2013) is: 7.8%   Values used to calculate the score:     Age: 28 years     Sex: Female     Is Non-Hispanic African American: Yes     Diabetic: Yes     Tobacco smoker: No     Systolic Blood Pressure: 0000000 mmHg     Is BP treated: Yes     HDL Cholesterol: 39.9 mg/dL     Total Cholesterol: 140 mg/dL     BP Readings from Last 3 Encounters:  02/12/21 128/90  02/07/21 (!) 152/88  12/31/20 (!) 160/98    Care Plan  Allergies  Allergen Reactions   Lisinopril Shortness Of Breath   Maxzide [Hydrochlorothiazide W-Triamterene] Other (See Comments)    Joint pain,  Malaise and bad mood   Vicodin [Hydrocodone-Acetaminophen] Palpitations    Medications Reviewed Today     Reviewed by De Hollingshead, RPH-CPP (Pharmacist) on 03/12/21 at 1406  Med List Status: <None>   Medication Order Taking? Sig Documenting Provider Last Dose Status Informant  amLODipine (NORVASC) 10 MG tablet [id number] Yes Take 1 tablet (10 mg total) by mouth daily. Skeet Latch, MD Taking Active   ASHWAGANDHA PO [id number] No Take by mouth.  Patient not taking: No sig reported   [provider] Not Taking Active   Ferrous Sulfate (IRON) 325 (65 Fe) MG TABS [id number] No Take 1 tablet (325 mg total) by mouth daily.  Patient not taking: Reported on 03/12/2021   Carrie Mew, MD Not Taking Active   furosemide (LASIX) 20 MG tablet [id number] No DAILY AS NEEDED FOR WEIGHT  GAIN OR SWELLING  Patient not taking: Reported on 03/12/2021   Skeet Latch, MD Not Taking Active   glucose blood test strip [id number] Yes Use to check blood sugars twice daily [name], MD  Active   HOMEOPATHIC PRODUCTS PO [id number] Yes Take by mouth. [employer] Blood Builder - vitamin C 15 mg, folic acid A999333 mcg, Vitamin B12 30 mcg, Iron 26 mg [provider] Taking Active   Semaglutide,0.25 or 0.'5MG'$ /DOS, (OZEMPIC, 0.25 OR 0.5 MG/DOSE,) 2 MG/1.5ML SOPN [id number] Yes Inject 0.25 mg into the skin once a week. [name], MD Taking Active   spironolactone (ALDACTONE) 25 MG tablet [id number] Yes Take 1 tablet (25 mg total) by mouth daily. Skeet Latch, MD Taking Active             Patient Active Problem List   Diagnosis Date Noted   Iron deficiency anemia due to chronic blood loss Q000111Q   Acute systolic heart failure ([place]) 07/19/2019   Hepatic steatosis 01/17/2015   Abdominal pain 01/17/2015   Encounter for preventive health examination 02/04/2014   Menorrhagia 05/04/2013   Recurrent ventral hernia 0000000   Umbilical hernia    Diabetes mellitus type 2 in obese ([place]) 10/31/2012   Obesity, morbid ([place]) 10/13/2012   Premenstrual symptom 10/13/2012   Essential hypertension, benign 10/13/2012    Conditions to be addressed/monitored: HTN and DMII  Care Plan : Medication Management  Updates made by De Hollingshead, RPH-CPP since 03/26/2021 12:00 AM     Problem: Diabetes, HTN,      Long-Range Goal: Disease Progression Prevention   Start Date: 03/12/2021  This Visit's Progress: On track  Recent Progress: On track  Priority: High  Note:   Current Barriers:  Unable to achieve control of diabetes   Pharmacist Clinical Goal(s):  Over the next 90 days, patient will achieve control of diabetes as evidenced by A1c  through collaboration with PharmD and provider.   Interventions: 1:1 collaboration with [name], MD regarding development and update of comprehensive plan of care as evidenced by provider attestation and co-signature Inter-disciplinary care team collaboration (see longitudinal plan of care) Comprehensive medication review performed; medication list updated in electronic medical record  Diabetes: Uncontrolled; current treatment: Ozempic 0.5 mg weekly Most recent weight: 250 lbs; baseline weight: 257 lbs Current meal patterns: mid-morning: [place] sausage, egg, green tea; dinner: quick meals if she gets off late, last night was Sonic, sometimes husband will cook  vegetables, grilled vegetables; drinks: green tea, misses the carbonation; notes decrease in sweet tooth, cutting back on fried/fatty foods; [facility] to help with lifestyle modification Current exercise: Tues/Thurs - works out at [employer] - cardio on Tuesdays, strength training on Thursdays. Sometimes able to work in other activity, but has an active part time job Educated on goal A1c, goal fasting, goal 2 hour post prandial glucose  Refill for Ozempic 0.5 mg weekly sent to pharmacy today per patient request.   Hypertension: Uncontrolled but improving; current treatment: amlodipine 10 mg daily, spironolactone 25 mg daily;  Current home readings: reports last reading ~ 140/80s Diet and physical activity as above Previously recommended to continue current regimen along with [facility] with [facility] as scheduled    Patient Goals/Self-Care Activities Over the next 90 days, patient will:  - take medications as prescribed check glucose daily, document, and provide at future appointments check blood pressure periodically, document, and provide at future appointments  Follow Up Plan: Telephone follow  up appointment with care management team member scheduled for: ~ 5 weeks as previously scheduled      Medication Assistance:  None required.  Patient affirms current coverage meets needs.  Follow Up:  Patient agrees to Care Plan and Follow-up.  Plan: Telephone follow up appointment with care management team member scheduled for:  ~6 weeks  Catie Darnelle Maffucci, PharmD, [place], [place] Clinical Pharmacist [employer] at [employer] [phone]

## 2021-03-26 NOTE — Progress Notes (Signed)
[facility] PREP Weekly Session   Patient Details  Name: Nicole Burns MRN: [id number] Date of Birth: 06-27-1976 Age: 45 y.o. PCP: [name], MD  Vitals:   03/26/21 0927  Weight: 251 lb (113.9 kg)     [facility] Weekly seesion - 03/26/21 0900       Weekly Session   Topic Discussed Water   Portion Size Matters; Build[employer].es demo; visualize your potion size demo   Minutes exercised this week 40 minutes    Classes attended to date [place] 03/26/2021, 9:28 AM

## 2021-03-26 NOTE — Patient Instructions (Signed)
Visit Information   Goals Addressed               This Visit's Progress     Patient Stated     Medication Monitoring (pt-stated)        Patient Goals/Self-Care Activities Over the next 90 days, patient will:  - take medications as prescribed check glucose daily, document, and provide at future appointments check blood pressure periodically, document, and provide at future appointments        Patient verbalizes understanding of instructions provided today and agrees to view in [place].   Plan: Telephone follow up appointment with care management team member scheduled for:  ~6 weeks  Catie Darnelle Maffucci, PharmD, [place], [place] Clinical Pharmacist [employer] at [employer] [phone]

## 2021-03-28 ENCOUNTER — Other Ambulatory Visit: Payer: Self-pay | Admitting: Internal Medicine

## 2021-03-28 ENCOUNTER — Encounter: Payer: Self-pay | Admitting: Gastroenterology

## 2021-03-28 DIAGNOSIS — E1169 Type 2 diabetes mellitus with other specified complication: Secondary | ICD-10-CM

## 2021-03-28 DIAGNOSIS — E669 Obesity, unspecified: Secondary | ICD-10-CM

## 2021-03-29 ENCOUNTER — Encounter
Admission: RE | Disposition: A | Discharge status: Home / Self Care | Payer: Self-pay | Source: Home / Self Care | Attending: Gastroenterology

## 2021-03-29 ENCOUNTER — Ambulatory Visit: Payer: Managed Care, Other (non HMO) | Admitting: Pharmacist

## 2021-03-29 ENCOUNTER — Encounter: Payer: Self-pay | Admitting: Gastroenterology

## 2021-03-29 ENCOUNTER — Other Ambulatory Visit: Payer: Self-pay

## 2021-03-29 ENCOUNTER — Ambulatory Visit
Admission: RE | Admit: 2021-03-29 | Discharge: 2021-03-29 | Disposition: A | Discharge status: Home / Self Care | Payer: Managed Care, Other (non HMO) | Attending: Gastroenterology | Admitting: Gastroenterology

## 2021-03-29 ENCOUNTER — Telehealth: Payer: Self-pay | Admitting: Pharmacist

## 2021-03-29 DIAGNOSIS — E1169 Type 2 diabetes mellitus with other specified complication: Secondary | ICD-10-CM

## 2021-03-29 DIAGNOSIS — E669 Obesity, unspecified: Secondary | ICD-10-CM

## 2021-03-29 DIAGNOSIS — I10 Essential (primary) hypertension: Secondary | ICD-10-CM

## 2021-03-29 SURGERY — COLONOSCOPY
Anesthesia: General

## 2021-03-29 MED ORDER — PROPOFOL 500 MG/50ML IV EMUL
INTRAVENOUS | Status: AC
  Filled 2021-03-29: qty 50

## 2021-03-29 MED ORDER — SODIUM CHLORIDE 0.9 % IV SOLN: INTRAVENOUS | Status: DC

## 2021-03-29 NOTE — Telephone Encounter (Signed)
Medication Samples have been labeled and logged for the patient.  Drug name: Ozempic       Strength:  2 mg/1.5 mL        Qty: 1 pen  LOT: [id number]  Exp.Date: 09/18/23  Dosing instructions: Inject 0.5 mg weekly  The patient has been instructed regarding the correct time, dose, and frequency of taking this medication, including desired effects and most common side effects.   De Hollingshead 8:43 AM 03/29/2021

## 2021-03-29 NOTE — Telephone Encounter (Signed)
Patient picked up medication

## 2021-03-29 NOTE — Chronic Care Management (AMB) (Signed)
Care Management   Pharmacy Note  03/29/2021 Name: Nicole Burns MRN: [id number] DOB: 1976-05-25  Subjective: Nicole Burns is a 45 y.o. year old female who is a primary care patient of [name], MD. The Care Management team was consulted for assistance with care management and care coordination needs.    Care coordination for  medication access  in response to provider referral for pharmacy case management and/or care coordination services.   The patient was given information about Care Management services today including:  Care Management services includes personalized support from designated clinical staff supervised by the patient's primary care provider, including individualized plan of care and coordination with other care providers. 24/7 contact phone numbers for assistance for urgent and routine care needs. The patient may stop case management services at any time by phone call to the office staff.  Patient agreed to services and consent obtained.  Assessment:  Review of patient status, including review of consultants reports, laboratory and other test data, was performed as part of comprehensive evaluation and provision of chronic care management services.   SDOH (Social Determinants of Health) assessments and interventions performed:    Objective:  Lab Results  Component Value Date   CREATININE 0.86 02/07/2021   CREATININE 0.92 08/18/2019   CREATININE 0.95 07/13/2019    Lab Results  Component Value Date   HGBA1C 7.0 (H) 02/07/2021       Component Value Date/Time   CHOL 140 08/18/2019 0843   TRIG 64.0 08/18/2019 0843   HDL 39.90 08/18/2019 0843   CHOLHDL 3 08/18/2019 0843   VLDL 12.8 08/18/2019 0843   LDLCALC 87 08/18/2019 0843     Clinical ASCVD: No  The 10-year ASCVD risk score (Arnett DK, et al., 2019) is: 4%   Values used to calculate the score:     Age: 33 years     Sex: Female     Is Non-Hispanic African American: Yes     Diabetic: Yes      Tobacco smoker: No     Systolic Blood Pressure: 0000000 mmHg     Is BP treated: No     HDL Cholesterol: 39.9 mg/dL     Total Cholesterol: 140 mg/dL     BP Readings from Last 3 Encounters:  02/12/21 128/90  02/07/21 (!) 152/88  12/31/20 (!) 160/98    Care Plan  Allergies  Allergen Reactions   Lisinopril Shortness Of Breath   Maxzide [Hydrochlorothiazide W-Triamterene] Other (See Comments)    Joint pain,  Malaise and bad mood   Vicodin [Hydrocodone-Acetaminophen] Palpitations    Medications Reviewed Today     Reviewed by Don Perking, RN (Registered Nurse) on 03/29/21 at [id number]  Med List Status: <None>   Medication Order Taking? Sig Documenting Provider Last Dose Status Informant  amLODipine (NORVASC) 10 MG tablet [id number] Yes Take 1 tablet (10 mg total) by mouth daily. Skeet Latch, MD 03/29/2021 0700 Active   ASHWAGANDHA PO [id number] Yes Take by mouth. [provider] Past Month Active   Ferrous Sulfate (IRON) 325 (65 Fe) MG TABS [id number] No Take 1 tablet (325 mg total) by mouth daily.  Patient not taking: Reported on 03/29/2021   Carrie Mew, MD Not Taking Active Self  furosemide (LASIX) 20 MG tablet [id number] No DAILY AS NEEDED FOR WEIGHT GAIN OR SWELLING  Patient not taking: No sig reported   Skeet Latch, MD Not Taking Active Self  glucose blood test strip [id number]  Use to check blood sugars twice daily [place],  Aris Everts, MD  Active   HOMEOPATHIC PRODUCTS PO [id number] Yes Take by mouth. MegaFood Blood Builder - vitamin C 15 mg, folic acid [id number] mcg, Vitamin B12 30 mcg, Iron 26 mg [provider] Past Week Active   Semaglutide,0.25 or 0.'5MG'$ /DOS, (OZEMPIC, 0.25 OR 0.5 MG/DOSE,) 2 MG/1.5ML SOPN [id number] No Inject 0.5 mg into the skin once a week. [name], MD 03/24/2021 Active   spironolactone (ALDACTONE) 25 MG tablet [id number] Yes Take 1 tablet (25 mg total) by mouth daily. Skeet Latch, MD 03/28/2021 Active             Patient  Active Problem List   Diagnosis Date Noted   Iron deficiency anemia due to chronic blood loss [id number]   Acute systolic heart failure ([place]) 07/19/2019   Hepatic steatosis 01/17/2015   Abdominal pain 01/17/2015   Encounter for preventive health examination 02/04/2014   Menorrhagia 05/04/2013   Recurrent ventral hernia 0000000   Umbilical hernia    Diabetes mellitus type 2 in obese ([place]) 10/31/2012   Obesity, morbid ([place]) 10/13/2012   Premenstrual symptom 10/13/2012   Essential hypertension, benign 10/13/2012    Conditions to be addressed/monitored: HTN and DMII  Care Plan : Medication Management  Updates made by De Hollingshead, RPH-CPP since 03/29/2021 12:00 AM     Problem: Diabetes, HTN,      Long-Range Goal: Disease Progression Prevention   Start Date: 03/12/2021  This Visit's Progress: On track  Recent Progress: On track  Priority: High  Note:   Current Barriers:  Unable to achieve control of diabetes   Pharmacist Clinical Goal(s):  Over the next 90 days, patient will achieve control of diabetes as evidenced by A1c  through collaboration with PharmD and provider.   Interventions: 1:1 collaboration with [name], MD regarding development and update of comprehensive plan of care as evidenced by provider attestation and co-signature Inter-disciplinary care team collaboration (see longitudinal plan of care) Comprehensive medication review performed; medication list updated in electronic medical record  Diabetes: Uncontrolled; current treatment: Ozempic 0.5 mg weekly Most recent weight: 250 lbs; baseline weight: 257 lbs Current meal patterns: mid-morning: [place] sausage, egg, green tea; dinner: quick meals if she gets off late, last night was S[place], sometimes husband will cook vegetables, grilled vegetables; drinks: green tea, misses the carbonation; notes decrease in sweet tooth, cutting back on fried/fatty foods; is in a program through [facility] to help  with lifestyle modification Current exercise: Tues/Thurs - works out at [employer] - cardio on Tuesdays, strength training on Thursdays. Sometimes able to work in other activity, but has an active part time job Educated on goal A1c, goal fasting, goal 2 hour post prandial glucose  Calls today to note that pharmacy cannot get Ozempic in stock. Provided sample.   Hypertension: Uncontrolled but improving; current treatment: amlodipine 10 mg daily, spironolactone 25 mg daily;  Current home readings: reports last reading ~ 140/80s Diet and physical activity as above Previously recommended to continue current regimen along with follow up with [facility] as scheduled    Patient Goals/Self-Care Activities Over the next 90 days, patient will:  - take medications as prescribed check glucose daily, document, and provide at future appointments check blood pressure periodically, document, and provide at future appointments  Follow Up Plan: Telephone follow up appointment with care management team member scheduled for: ~ 4 weeks as previously scheduled      Medication Assistance:  None required.  Patient affirms current coverage meets needs.  Follow Up:  Patient agrees to Care Plan and Follow-up.  Plan: Telephone follow up appointment with care management team member scheduled for:  ~4 weeks as previously scheduled  Catie Darnelle Maffucci, PharmD, [place], [place] Clinical Pharmacist [employer] at [facility] [phone]

## 2021-03-29 NOTE — Anesthesia Preprocedure Evaluation (Deleted)
Anesthesia Evaluation  Patient identified by MRN, date of birth, ID band Patient awake    Reviewed: Allergy & Precautions, NPO status , Patient's Chart, lab work & pertinent test results  Airway Mallampati: II   Neck ROM: full    Dental   Pulmonary    breath sounds clear to auscultation       Cardiovascular hypertension,  Rhythm:regular Rate:Normal     Neuro/Psych  Headaches,    GI/Hepatic   Endo/Other  diabetes, Type 2Morbid obesity  Renal/GU      Musculoskeletal   Abdominal   Peds  Hematology   Anesthesia Other Findings Abortion, missed 08/09/2015   Fatty liver    Gallbladder polyp    Hepatic cyst    History of chicken pox    Hypertension    Migraine  After menses cycle.   Umbilical hernia       Reproductive/Obstetrics                             Anesthesia Physical  Anesthesia Plan  ASA: II  Anesthesia Plan: General   Post-op Pain Management:    Induction: Intravenous  PONV Risk Score and Plan:   Airway Management Planned: Oral ETT  Additional Equipment:   Intra-op Plan:   Post-operative Plan: Extubation in OR  Informed Consent: I have reviewed the patients History and Physical, chart, labs and discussed the procedure including the risks, benefits and alternatives for the proposed anesthesia with the patient or authorized representative who has indicated his/her understanding and acceptance.       Plan Discussed with: CRNA, Anesthesiologist and Surgeon  Anesthesia Plan Comments:         Anesthesia Quick Evaluation

## 2021-03-29 NOTE — Progress Notes (Signed)
Patient verbalized that after colon prep, she is still having brown, liquid/watery stool.  Dr Bonna Gains spoke with the patient and it was decided to reschedule the patient for another appointment.  Dr Michele Mcalpine office to speak with the patient for reschedule date/ and provide instructions for a repeat colon prep.  Patient agrees with this plan.

## 2021-03-29 NOTE — Patient Instructions (Signed)
Visit Information   Goals Addressed               This Visit's Progress     Patient Stated     Medication Monitoring (pt-stated)        Patient Goals/Self-Care Activities Over the next 90 days, patient will:  - take medications as prescribed check glucose daily, document, and provide at future appointments check blood pressure periodically, document, and provide at future appointments        Patient verbalizes understanding of instructions provided today and agrees to view in [place].    Plan: Telephone follow up appointment with care management team member scheduled for:  ~4 weeks as previously scheduled  Catie Darnelle Maffucci, PharmD, [place], [place] Clinical Pharmacist [employer] at [facility] [phone]

## 2021-04-02 NOTE — Telephone Encounter (Signed)
Pt called back and stated that she does have a separate card for prescriptions. Asked pt to upload it to her mychart. Pt gave a verbal understanding. PA will be submitted as soon as we receive the insurance information.

## 2021-04-02 NOTE — Telephone Encounter (Signed)
Attempted the PA on covermymeds. It states that pt is inactive with her [employer].   LMTCB. Need to find out if pt has new insurance or has separate medication coverage.

## 2021-04-03 ENCOUNTER — Telehealth: Payer: Self-pay | Admitting: Internal Medicine

## 2021-04-03 ENCOUNTER — Other Ambulatory Visit: Payer: Self-pay | Admitting: Internal Medicine

## 2021-04-03 MED ORDER — TIRZEPATIDE 2.5 MG/0.5ML ~~LOC~~ SOAJ: 2.5000 mg | SUBCUTANEOUS | 2 refills | Status: DC

## 2021-04-03 NOTE — Addendum Note (Signed)
Addended by: [name] on: 04/03/2021 03:47 PM   Modules accepted: Orders

## 2021-04-03 NOTE — Telephone Encounter (Signed)
Bum[place] from CoverMyMeds is calling in to get a prior authorization for the patients Ozempic.Please call him at [phone] ref number [id number].

## 2021-04-03 NOTE — Telephone Encounter (Signed)
Attempted to call pt. Voicemail is full.  

## 2021-04-03 NOTE — Telephone Encounter (Signed)
Tell patient to download the copay card from [email or web address] and take to pharmacy . Nicole Burns is very similar to ozempic\

## 2021-04-03 NOTE — Telephone Encounter (Signed)
Spoke with pt's medication insurance company and he stated that he is going to fax over the forms for completion since unable to do on covermymeds.

## 2021-04-04 ENCOUNTER — Other Ambulatory Visit: Payer: Self-pay | Admitting: Gastroenterology

## 2021-04-04 ENCOUNTER — Telehealth: Payer: Self-pay

## 2021-04-04 DIAGNOSIS — Z1211 Encounter for screening for malignant neoplasm of colon: Secondary | ICD-10-CM

## 2021-04-04 NOTE — Telephone Encounter (Signed)
Dr. Derrel Nip sent in [place] instead

## 2021-04-04 NOTE — Telephone Encounter (Signed)
-----   Message from Virgel Manifold, MD sent at 03/29/2021 10:15 AM EDT ----- This pt will need to be rescheduled for her screening colonoscopy. Please reschedule her for a later date with a 2 day prep. If we have sample preps in the office, please give her those. She states she did not get the prep from the pharmacy until after 5 PM because [facility] did not have it.    She still had stool output this morning after completing the prep, and I discussed options with her in detail including proceeding with the procedure (with the possibility of needing repeat procedure in the near future or incomplete procedure). Pt is choosing to reschedule and reports recent constipation after starting ozempic.

## 2021-04-04 NOTE — Telephone Encounter (Signed)
Left message on voicemail to return call to r/s procedure      2 boxes of Bowel Prep: Clenpiq  2 days prior to Procedure-     Do not eat any solid foods or dairy products of any kind after 12 noon.    Between Noon and 4pm- Drink 1 bottle of prep Clenpiq, drink 5 cups of water after finishing bottle of prep.  Take usually prescriptions medications (except iron and or any other stopped medication)    Clear liquid diet   Do not drink any Red colored beverages or eat Red Jello or popsicles. No Alcohol   One day before procedure-     Between 1pm and 3pm take Bisacodyl (Dulcolax) '10mg'$  by mouth once   At 5pm-  drink 2nd bottle of Clenpiq bowel prep   Continue to drink clear liquids after the prep is completed. The more you drink the better the prep.   Take usual prescription medications except iron and any other stopped medication.   5 hours before procedure-  drink 3rd bottle of Clenpiq bowel prep  4 hours before the procedure: stop drinking all liquids

## 2021-04-04 NOTE — Telephone Encounter (Signed)
Pt returned my call and r/s colonoscopy for Oct 7 and Clenpiq prep, 2 boxes placed in the front office for pt to pick up w/ print out of 2 day prep instructions

## 2021-04-10 ENCOUNTER — Other Ambulatory Visit: Payer: Self-pay

## 2021-04-10 ENCOUNTER — Ambulatory Visit (INDEPENDENT_AMBULATORY_CARE_PROVIDER_SITE_OTHER): Payer: Managed Care, Other (non HMO) | Admitting: Pharmacist Clinician (PhC)/ Clinical Pharmacy Specialist

## 2021-04-10 VITALS — BP 148/84 | HR 75 | Resp 14 | Ht 63.0 in | Wt 250.6 lb

## 2021-04-10 DIAGNOSIS — I10 Essential (primary) hypertension: Secondary | ICD-10-CM

## 2021-04-10 MED ORDER — OLMESARTAN MEDOXOMIL 20 MG PO TABS: 20.0000 mg | ORAL_TABLET | Freq: Every day | ORAL | 6 refills | Status: DC

## 2021-04-10 NOTE — Patient Instructions (Signed)
Return for a a follow up appointment November 21 at 11 am  Go to the lab in 2 weeks to check kidney function  Check your blood pressure at home daily and keep record of the readings.  Take your BP meds as follows:  Start olmesartan 20 mg once daily in the mornings with spironolactone  Continue amlodipine in the evenings  Bring all of your meds, your BP cuff and your record of home blood pressures to your next appointment.  Exercise as you're able, try to walk approximately 30 minutes per day.  Keep salt intake to a minimum, especially watch canned and prepared boxed foods.  Eat more fresh fruits and vegetables and fewer canned items.  Avoid eating in fast food restaurants.    HOW TO TAKE YOUR BLOOD PRESSURE: Rest 5 minutes before taking your blood pressure.  Don't smoke or drink caffeinated beverages for at least 30 minutes before. Take your blood pressure before (not after) you eat. Sit comfortably with your back supported and both feet on the floor (don't cross your legs). Elevate your arm to heart level on a table or a desk. Use the proper sized cuff. It should fit smoothly and snugly around your bare upper arm. There should be enough room to slip a fingertip under the cuff. The bottom edge of the cuff should be 1 inch above the crease of the elbow. Ideally, take 3 measurements at one sitting and record the average.

## 2021-04-10 NOTE — Progress Notes (Signed)
04/10/2021 [name] [birth year] [id number]   HPI:  Nicole Burns is a 45 y.o. female patient of Dr Oval Linsey, who presents today for advanced hypertension clinic follow up.  In addition to hypertension her medical history is significant for DM2, anemia and obesity. Originally seen by Dr. Oval Linsey in May, her pressure at the time was noted to be 194/110.   She noted that she has had problems with hypertension since her first pregnancy, close to 30 years ago.   Spironolactone was added to her amlodipine by Dr. Oval Linsey.  At a follow up 2 months later her office reading was improved at 152/88.  Amlodipine was increased to 10 mg and metabolic panel was checked.  Labs were stable, with potassium up to 4.4.  A1c was also checked that day and found to be at 7.0.      Today she returns for follow up.    Blood Pressure Goal:  130/80  Current Medications: amlodipine 10 mg qd, (am) spironolactone 25 mg qd (am)  Family Hx:   father with CABG, died later from [place] at 86; mother living now, healthy w. Htn; 1 sister with hypertension; 2 daughters, 69, 26, second with hypertension  Social Hx: no tobacco, no alcohol, 1 coke zero per day  Diet: trying to watch sodium intake, doesn't eat out much, no deep fried; smokes or grills most foods, occasional air fried; vegetables mostly frozen; canned only if rinsed  Exercise: not much,   Home BP readings: none recently; from recall low 140 ; 78-84  Intolerances: lisinopril - SOB  Labs: 7/22:  Na 135, K 4.4, Glu 119, BUN 10, SCr 0.86 eGFR 84   Wt Readings from Last 3 Encounters:  04/10/21 250 lb 9.6 oz (113.7 kg)  03/26/21 251 lb (113.9 kg)  03/12/21 250 lb (113.4 kg)   BP Readings from Last 3 Encounters:  04/10/21 (!) 148/84  02/12/21 128/90  02/07/21 (!) 152/88   Pulse Readings from Last 3 Encounters:  04/10/21 75  02/12/21 80  02/07/21 78    Current Outpatient Medications  Medication Sig Dispense Refill   amLODipine (NORVASC) 10  MG tablet Take 1 tablet (10 mg total) by mouth daily. 90 tablet 1   ASHWAGANDHA PO Take by mouth.     Ferrous Sulfate (IRON) 325 (65 Fe) MG TABS Take 1 tablet (325 mg total) by mouth daily. 60 tablet 0   glucose blood test strip Use to check blood sugars twice daily 200 each 3   HOMEOPATHIC PRODUCTS PO Take by mouth. MegaFood Blood Builder - vitamin C 15 mg, folic acid 629 mcg, Vitamin B12 30 mcg, Iron 26 mg     olmesartan (BENICAR) 20 MG tablet Take 1 tablet (20 mg total) by mouth daily. 30 tablet 6   Semaglutide,0.25 or 0.5MG/DOS, (OZEMPIC, 0.25 OR 0.5 MG/DOSE,) 2 MG/1.5ML SOPN Inject into the skin.     spironolactone (ALDACTONE) 25 MG tablet Take 1 tablet (25 mg total) by mouth daily. 90 tablet 3   furosemide (LASIX) 20 MG tablet DAILY AS NEEDED FOR WEIGHT GAIN OR SWELLING (Patient not taking: No sig reported) 30 tablet 3   No current facility-administered medications for this visit.    Allergies  Allergen Reactions   Lisinopril Shortness Of Breath   Maxzide [Hydrochlorothiazide W-Triamterene] Other (See Comments)    Joint pain,  Malaise and bad mood   Vicodin [Hydrocodone-Acetaminophen] Palpitations    Past Medical History:  Diagnosis Date   Abortion, missed 08/09/2015   Fatty  liver    Gallbladder polyp    Hepatic cyst    History of chicken pox    Hypertension    Migraine    After menses cycle.    Umbilical hernia 4431    Blood pressure (!) 148/84, pulse 75, resp. rate 14, height _0  (1.6 m), weight 250 lb 9.6 oz (113.7 kg), last menstrual period 04/02/2021, SpO2 99 %.  Essential hypertension, benign Patient with essential hypertension, still not at goal, but better than in the past.  Discussed meaning of BP numbers and significance of keeping readings < 130/80.  Will have her add in olmesartan 20 mg once daily in the mornings and continue with spironolactone and amlodipine.  She should check home BP readings more regularly and return in 2 months for follow up.  She will  need to repeat metabolic panel after 2 weeks on the olmesartan.     Tommy Medal PharmD CPP [facility] [address] [place], [place] [address] [phone]

## 2021-04-10 NOTE — Assessment & Plan Note (Signed)
Patient with essential hypertension, still not at goal, but better than in the past.  Discussed meaning of BP numbers and significance of keeping readings < 130/80.  Will have her add in olmesartan 20 mg once daily in the mornings and continue with spironolactone and amlodipine.  She should check home BP readings more regularly and return in 2 months for follow up.  She will need to repeat metabolic panel after 2 weeks on the olmesartan.

## 2021-04-16 ENCOUNTER — Other Ambulatory Visit: Payer: Self-pay | Admitting: Cardiovascular Disease

## 2021-04-19 ENCOUNTER — Ambulatory Visit: Payer: Managed Care, Other (non HMO) | Admitting: Pharmacist

## 2021-04-19 DIAGNOSIS — E1169 Type 2 diabetes mellitus with other specified complication: Secondary | ICD-10-CM

## 2021-04-19 DIAGNOSIS — I10 Essential (primary) hypertension: Secondary | ICD-10-CM

## 2021-04-19 DIAGNOSIS — E669 Obesity, unspecified: Secondary | ICD-10-CM

## 2021-04-19 NOTE — Patient Instructions (Signed)
Visit Information   Goals Addressed               This Visit's Progress     Patient Stated     Medication Monitoring (pt-stated)        Patient Goals/Self-Care Activities Over the next 90 days, patient will:  - take medications as prescribed check glucose daily, document, and provide at future appointments check blood pressure periodically, document, and provide at future appointments         Patient verbalizes understanding of instructions provided today and agrees to view in [place].    Plan: Telephone follow up appointment with care management team member scheduled for:  ~ 8 weeks  Catie Darnelle Maffucci, PharmD, [place], [place] Clinical Pharmacist [employer] at [employer] [phone]

## 2021-04-19 NOTE — Chronic Care Management (AMB) (Signed)
Care Management   Pharmacy Note  04/19/2021 Name: Nicole Burns MRN: [id number] DOB: [birth year]  Subjective: Nicole Burns is a 45 y.o. year old female who is a primary care patient of [name], MD. The Care Management team was consulted for assistance with care management and care coordination needs.    Engaged with patient by telephone for follow up visit in response to provider referral for pharmacy case management and/or care coordination services.   The patient was given information about Care Management services today including:  Care Management services includes personalized support from designated clinical staff supervised by the patient's primary care provider, including individualized plan of care and coordination with other care providers. 24/7 contact phone numbers for assistance for urgent and routine care needs. The patient may stop case management services at any time by phone call to the office staff.  Patient agreed to services and consent obtained.  Assessment:  Review of patient status, including review of consultants reports, laboratory and other test data, was performed as part of comprehensive evaluation and provision of chronic care management services.   SDOH (Social Determinants of Health) assessments and interventions performed:  SDOH Interventions    Flowsheet Row Most Recent Value  SDOH Interventions   Financial Strain Interventions Intervention Not Indicated        Objective:  Lab Results  Component Value Date   CREATININE 0.86 02/07/2021   CREATININE 0.92 08/18/2019   CREATININE 0.95 07/13/2019    Lab Results  Component Value Date   HGBA1C 7.0 (H) 02/07/2021       Component Value Date/Time   CHOL 140 08/18/2019 0843   TRIG 64.0 08/18/2019 0843   HDL 39.90 08/18/2019 0843   CHOLHDL 3 08/18/2019 0843   VLDL 12.8 08/18/2019 0843   LDLCALC 87 08/18/2019 0843    BP Readings from Last 3 Encounters:  04/10/21 (!) 148/84   02/12/21 128/90  02/07/21 (!) 152/88    Care Plan  Allergies  Allergen Reactions   Lisinopril Shortness Of Breath   Maxzide [Hydrochlorothiazide W-Triamterene] Other (See Comments)    Joint pain,  Malaise and bad mood   Vicodin [Hydrocodone-Acetaminophen] Palpitations    Medications Reviewed Today     Reviewed by De Hollingshead, RPH-CPP (Pharmacist) on 04/19/21 at 0934  Med List Status: <None>   Medication Order Taking? Sig Documenting Provider Last Dose Status Informant  amLODipine (NORVASC) 10 MG tablet [id number] Yes Take 1 tablet (10 mg total) by mouth daily. Skeet Latch, MD Taking Active   Ferrous Sulfate (IRON) 325 (65 Fe) MG TABS [id number] No Take 1 tablet (325 mg total) by mouth daily.  Patient not taking: Reported on 04/19/2021   Carrie Mew, MD Not Taking Active Self  furosemide (LASIX) 20 MG tablet [id number] Yes TAKE ONE TAB DAILY AS NEEDED FOR WEIGHT GAIN OR SWELLING Skeet Latch, MD Taking Active   glucose blood test strip [id number] Yes Use to check blood sugars twice daily [name], MD Taking Active   HOMEOPATHIC PRODUCTS PO [id number] No Take by mouth. MegaFood Blood Builder - vitamin C 15 mg, folic acid 675 mcg, Vitamin B12 30 mcg, Iron 26 mg  Patient not taking: Reported on 04/19/2021   [provider] Not Taking Active   olmesartan (BENICAR) 20 MG tablet [id number] Yes Take 1 tablet (20 mg total) by mouth daily. Skeet Latch, MD Taking Active   Semaglutide,0.25 or 0.5MG /DOS, (OZEMPIC, 0.25 OR 0.5 MG/DOSE,) 2 MG/1.5ML SOPN [id number] Yes Inject 0.5 mg into the skin  once a week. [provider] Taking Active   spironolactone (ALDACTONE) 25 MG tablet [id number] Yes Take 1 tablet (25 mg total) by mouth daily. Skeet Latch, MD Taking Active             Patient Active Problem List   Diagnosis Date Noted   Iron deficiency anemia due to chronic blood loss 53/97/6734   Acute systolic heart failure ([place]) 07/19/2019    Hepatic steatosis 01/17/2015   Abdominal pain 01/17/2015   Encounter for preventive health examination 02/04/2014   Menorrhagia 05/04/2013   Recurrent ventral hernia 19/37/9024   Umbilical hernia    Diabetes mellitus type 2 in obese ([place]) 10/31/2012   Obesity, morbid ([place]) 10/13/2012   Premenstrual symptom 10/13/2012   Essential hypertension, benign 10/13/2012    Conditions to be addressed/monitored: HTN and DMII  Care Plan : Medication Management  Updates made by De Hollingshead, RPH-CPP since 04/19/2021 12:00 AM     Problem: Diabetes, HTN,      Long-Range Goal: Disease Progression Prevention   Start Date: 03/12/2021  This Visit's Progress: On track  Recent Progress: On track  Priority: High  Note:   Current Barriers:  Unable to achieve control of diabetes   Pharmacist Clinical Goal(s):  Over the next 90 days, patient will achieve control of diabetes as evidenced by A1c  through collaboration with PharmD and provider.   Interventions: 1:1 collaboration with [name], MD regarding development and update of comprehensive plan of care as evidenced by provider attestation and co-signature Inter-disciplinary care team collaboration (see longitudinal plan of care) Comprehensive medication review performed; medication list updated in electronic medical record  Diabetes: Uncontrolled; current treatment: Ozempic 0.5 mg weekly Reports a "sour stomach" feeling for the first few days after her dose, but also endorses more fast food/fatty food recently Most recent weight: 250 lbs; baseline weight: 257 lbs Current glucose readings: has not been checking recently Current meal patterns: more fatty foods recently.  Current exercise: In HTN program with the [facility] Tues/Thurs; cardio and strength training Reviewed goal A1c, goal fasting, and goal 2 hour post prandial glucose. Advised to restart home glucose monitoring. Discussed pursuing if Libre CGM would be covered by  insurance. She would like to contemplate this.  Advised to reduce Ozempic to in between 0.25 mg and 0.5 mg, advised to click up 10 clicks from 0.97 mg and see if this dose is better tolerated. Also reviewed importance of maintaining small, low fat meals. PA for Ozempic completed. Will follow for result.   Hypertension: Uncontrolled but improving; current treatment: amlodipine 10 mg daily, spironolactone 25 mg daily, olmesartan 20 mg QAM  Current home readings: reports 2 home readings still in ~ 140/80s, but reports it is difficult for her to relax prior to checking BP at home.  Diet and physical activity as above Previously recommended to continue current regimen along with follow up with HTN Clinic as scheduled. Reviewed importance of keeping plan to check BMP s/p initiation of ARB next week   Patient Goals/Self-Care Activities Over the next 90 days, patient will:  - take medications as prescribed check glucose daily, document, and provide at future appointments check blood pressure periodically, document, and provide at future appointments  Follow Up Plan: Telephone follow up appointment with care management team member scheduled for: ~ 8 weeks (PCP in 4 weeks)      Medication Assistance:  None required.  Patient affirms current coverage meets needs.  Follow Up:  Patient agrees to Care  Plan and Follow-up.  Plan: Telephone follow up appointment with care management team member scheduled for:  ~ 8 weeks  Catie Darnelle Maffucci, PharmD, [place], [place] Clinical Pharmacist [employer] [facility] [employer] [phone]

## 2021-04-22 ENCOUNTER — Ambulatory Visit: Payer: Managed Care, Other (non HMO) | Admitting: Pharmacist

## 2021-04-22 DIAGNOSIS — E1169 Type 2 diabetes mellitus with other specified complication: Secondary | ICD-10-CM

## 2021-04-22 MED ORDER — OZEMPIC (0.25 OR 0.5 MG/DOSE) 2 MG/1.5ML ~~LOC~~ SOPN: 0.5000 mg | PEN_INJECTOR | SUBCUTANEOUS | 2 refills | Status: DC

## 2021-04-22 NOTE — Patient Instructions (Signed)
Visit Information   Goals Addressed               This Visit's Progress     Patient Stated     Medication Monitoring (pt-stated)        Patient Goals/Self-Care Activities Over the next 90 days, patient will:  - take medications as prescribed check glucose daily, document, and provide at future appointments check blood pressure periodically, document, and provide at future appointments        Patient verbalizes understanding of instructions provided today and agrees to view in [place].   Plan: Telephone follow up appointment with care management team member scheduled for:  8 weeks as previously scheduled  Catie Darnelle Maffucci, PharmD, [place], [place] Clinical Pharmacist [employer] [facility] [employer] [phone]

## 2021-04-22 NOTE — Chronic Care Management (AMB) (Signed)
Care Management   Pharmacy Note  04/22/2021 Name: Nicole Burns MRN: [id number] DOB: [birth year]  Subjective: Nicole Burns is a 45 y.o. year old female who is a primary care patient of [name], MD. The Care Management team was consulted for assistance with care management and care coordination needs.    Care coordination  for  medication access  in response to provider referral for pharmacy case management and/or care coordination services.   The patient was given information about Care Management services today including:  Care Management services includes personalized support from designated clinical staff supervised by the patient's primary care provider, including individualized plan of care and coordination with other care providers. 24/7 contact phone numbers for assistance for urgent and routine care needs. The patient may stop case management services at any time by phone call to the office staff.  Patient agreed to services and consent obtained.  Assessment:  Review of patient status, including review of consultants reports, laboratory and other test data, was performed as part of comprehensive evaluation and provision of chronic care management services.   SDOH (Social Determinants of Health) assessments and interventions performed:  SDOH Interventions    Flowsheet Row Most Recent Value  SDOH Interventions   Financial Strain Interventions Intervention Not Indicated        Objective:  Lab Results  Component Value Date   CREATININE 0.86 02/07/2021   CREATININE 0.92 08/18/2019   CREATININE 0.95 07/13/2019    Lab Results  Component Value Date   HGBA1C 7.0 (H) 02/07/2021       Component Value Date/Time   CHOL 140 08/18/2019 0843   TRIG 64.0 08/18/2019 0843   HDL 39.90 08/18/2019 0843   CHOLHDL 3 08/18/2019 0843   VLDL 12.8 08/18/2019 0843   LDLCALC 87 08/18/2019 0843     BP Readings from Last 3 Encounters:  04/10/21 (!) 148/84  02/12/21  128/90  02/07/21 (!) 152/88    Care Plan  Allergies  Allergen Reactions   Lisinopril Shortness Of Breath   Maxzide [Hydrochlorothiazide W-Triamterene] Other (See Comments)    Joint pain,  Malaise and bad mood   Vicodin [Hydrocodone-Acetaminophen] Palpitations    Medications Reviewed Today     Reviewed by De Hollingshead, RPH-CPP (Pharmacist) on 04/19/21 at 0934  Med List Status: <None>   Medication Order Taking? Sig Documenting Provider Last Dose Status Informant  amLODipine (NORVASC) 10 MG tablet [id number] Yes Take 1 tablet (10 mg total) by mouth daily. Skeet Latch, MD Taking Active   Ferrous Sulfate (IRON) 325 (65 Fe) MG TABS [id number] No Take 1 tablet (325 mg total) by mouth daily.  Patient not taking: Reported on 04/19/2021   Carrie Mew, MD Not Taking Active Self  furosemide (LASIX) 20 MG tablet [id number] Yes TAKE ONE TAB DAILY AS NEEDED FOR WEIGHT GAIN OR SWELLING Skeet Latch, MD Taking Active   glucose blood test strip [id number] Yes Use to check blood sugars twice daily [name], MD Taking Active   HOMEOPATHIC PRODUCTS PO [id number] No Take by mouth. MegaFood Blood Builder - vitamin C 15 mg, folic acid 299 mcg, Vitamin B12 30 mcg, Iron 26 mg  Patient not taking: Reported on 04/19/2021   [provider] Not Taking Active   olmesartan (BENICAR) 20 MG tablet [id number] Yes Take 1 tablet (20 mg total) by mouth daily. Skeet Latch, MD Taking Active   Semaglutide,0.25 or 0.5MG /DOS, (OZEMPIC, 0.25 OR 0.5 MG/DOSE,) 2 MG/1.5ML SOPN [id number] Yes Inject 0.5 mg into the skin  once a week. [provider] Taking Active   spironolactone (ALDACTONE) 25 MG tablet [id number] Yes Take 1 tablet (25 mg total) by mouth daily. Skeet Latch, MD Taking Active             Patient Active Problem List   Diagnosis Date Noted   Iron deficiency anemia due to chronic blood loss 40/98/1191   Acute systolic heart failure ([place]) 07/19/2019   Hepatic  steatosis 01/17/2015   Abdominal pain 01/17/2015   Encounter for preventive health examination 02/04/2014   Menorrhagia 05/04/2013   Recurrent ventral hernia 47/82/9562   Umbilical hernia    Diabetes mellitus type 2 in obese ([place]) 10/31/2012   Obesity, morbid ([place]) 10/13/2012   Premenstrual symptom 10/13/2012   Essential hypertension, benign 10/13/2012    Conditions to be addressed/monitored: HTN and DMII  Care Plan : Medication Management  Updates made by De Hollingshead, RPH-CPP since 04/22/2021 12:00 AM     Problem: Diabetes, HTN,      Long-Range Goal: Disease Progression Prevention   Start Date: 03/12/2021  Recent Progress: On track  Priority: High  Note:   Current Barriers:  Unable to achieve control of diabetes   Pharmacist Clinical Goal(s):  Over the next 90 days, patient will achieve control of diabetes as evidenced by A1c  through collaboration with PharmD and provider.   Interventions: 1:1 collaboration with [name], MD regarding development and update of comprehensive plan of care as evidenced by provider attestation and co-signature Inter-disciplinary care team collaboration (see longitudinal plan of care) Comprehensive medication review performed; medication list updated in electronic medical record  Diabetes: Uncontrolled; current treatment: Ozempic 0.5 mg weekly Reports a "sour stomach" feeling for the first few days after her dose, but also endorses more fast food/fatty food recently Most recent weight: 250 lbs; baseline weight: 257 lbs Current glucose readings: has not been checking recently Current meal patterns: more fatty foods recently.  Current exercise: In HTN program with the [facility] Tues/Thurs; cardio and strength training PA for Ozempic approved 04/21/21-04/21/2022. Script sent to the pharmacy  Hypertension: Uncontrolled but improving; current treatment: amlodipine 10 mg daily, spironolactone 25 mg daily, olmesartan 20 mg QAM  Current  home readings: reports 2 home readings still in ~ 140/80s, but reports it is difficult for her to relax prior to checking BP at home.  Diet and physical activity as above Previously recommended to continue current regimen along with follow up with HTN Clinic as scheduled. Reviewed importance of keeping plan to check BMP s/p initiation of ARB next week   Patient Goals/Self-Care Activities Over the next 90 days, patient will:  - take medications as prescribed check glucose daily, document, and provide at future appointments check blood pressure periodically, document, and provide at future appointments  Follow Up Plan: Telephone follow up appointment with care management team member scheduled for: ~ 8 weeks (PCP in 4 weeks)      Medication Assistance:  None required.  Patient affirms current coverage meets needs.  Follow Up:  Patient agrees to Care Plan and Follow-up.  Plan: Telephone follow up appointment with care management team member scheduled for:  8 weeks as previously scheduled  Catie Darnelle Maffucci, PharmD, [place], [place] Clinical Pharmacist [employer] [facility] [employer] [phone]

## 2021-04-25 LAB — BASIC METABOLIC PANEL
BUN/Creatinine Ratio: 13 (ref 9–23)
BUN: 13 mg/dL (ref 6–24)
CO2: 21 mmol/L (ref 20–29)
Calcium: 9.6 mg/dL (ref 8.7–10.2)
Chloride: 108 mmol/L — ABNORMAL HIGH (ref 96–106)
Creatinine, Ser: 1 mg/dL (ref 0.57–1.00)
Glucose: 113 mg/dL — ABNORMAL HIGH (ref 70–99)
Potassium: 4 mmol/L (ref 3.5–5.2)
Sodium: 141 mmol/L (ref 134–144)
eGFR: 70 mL/min/{1.73_m2} (ref >59)

## 2021-04-26 ENCOUNTER — Encounter
Admission: RE | Disposition: A | Discharge status: Home / Self Care | Payer: Self-pay | Source: Home / Self Care | Attending: Gastroenterology

## 2021-04-26 ENCOUNTER — Ambulatory Visit: Payer: Managed Care, Other (non HMO) | Admitting: Anesthesiology

## 2021-04-26 ENCOUNTER — Encounter: Payer: Self-pay | Admitting: Gastroenterology

## 2021-04-26 ENCOUNTER — Ambulatory Visit
Admission: RE | Admit: 2021-04-26 | Discharge: 2021-04-26 | Disposition: A | Discharge status: Home / Self Care | Payer: Managed Care, Other (non HMO) | Attending: Gastroenterology | Admitting: Gastroenterology

## 2021-04-26 DIAGNOSIS — Z885 Allergy status to narcotic agent status: Secondary | ICD-10-CM | POA: Diagnosis not present

## 2021-04-26 DIAGNOSIS — K648 Other hemorrhoids: Secondary | ICD-10-CM | POA: Insufficient documentation

## 2021-04-26 DIAGNOSIS — K644 Residual hemorrhoidal skin tags: Secondary | ICD-10-CM | POA: Diagnosis not present

## 2021-04-26 DIAGNOSIS — Z888 Allergy status to other drugs, medicaments and biological substances status: Secondary | ICD-10-CM | POA: Diagnosis not present

## 2021-04-26 DIAGNOSIS — Z1211 Encounter for screening for malignant neoplasm of colon: Secondary | ICD-10-CM

## 2021-04-26 DIAGNOSIS — K5730 Diverticulosis of large intestine without perforation or abscess without bleeding: Secondary | ICD-10-CM | POA: Diagnosis not present

## 2021-04-26 DIAGNOSIS — K635 Polyp of colon: Secondary | ICD-10-CM | POA: Diagnosis not present

## 2021-04-26 DIAGNOSIS — Z79899 Other long term (current) drug therapy: Secondary | ICD-10-CM | POA: Insufficient documentation

## 2021-04-26 HISTORY — PX: COLONOSCOPY WITH PROPOFOL: SHX5780

## 2021-04-26 HISTORY — DX: Type 2 diabetes mellitus without complications: E11.9

## 2021-04-26 LAB — POCT PREGNANCY, URINE: Preg Test, Ur: NEGATIVE

## 2021-04-26 LAB — GLUCOSE, CAPILLARY: Glucose-Capillary: 89 mg/dL (ref 70–99)

## 2021-04-26 SURGERY — COLONOSCOPY WITH PROPOFOL
Anesthesia: General

## 2021-04-26 MED ORDER — PROPOFOL 500 MG/50ML IV EMUL
INTRAVENOUS | Status: DC | PRN
  Administered 2021-04-26: 120 ug/kg/min via INTRAVENOUS

## 2021-04-26 MED ORDER — LIDOCAINE HCL (CARDIAC) PF 100 MG/5ML IV SOSY
PREFILLED_SYRINGE | INTRAVENOUS | Status: DC | PRN
  Administered 2021-04-26: 60 mg via INTRAVENOUS

## 2021-04-26 MED ORDER — PROPOFOL 10 MG/ML IV BOLUS
INTRAVENOUS | Status: DC | PRN
  Administered 2021-04-26: 60 mg via INTRAVENOUS
  Administered 2021-04-26: 20 mg via INTRAVENOUS

## 2021-04-26 MED ORDER — DEXMEDETOMIDINE (PRECEDEX) IN NS 20 MCG/5ML (4 MCG/ML) IV SYRINGE
PREFILLED_SYRINGE | INTRAVENOUS | Status: DC | PRN
  Administered 2021-04-26: 12 ug via INTRAVENOUS

## 2021-04-26 MED ORDER — SODIUM CHLORIDE 0.9 % IV SOLN
INTRAVENOUS | Status: DC
  Administered 2021-04-26: 1000 mL via INTRAVENOUS

## 2021-04-26 NOTE — H&P (Signed)
Nicole Antigua, MD [address], [place], [place], [place], [address] [address], [place], [place], [place], [address] Phone: [phone]  Fax: [phone]  Primary Care Physician:  [name], MD   Pre-Procedure History & Physical: HPI:  Nicole Burns is a 45 y.o. female is here for a colonoscopy.   Past Medical History:  Diagnosis Date   Abortion, missed 08/09/2015   Diabetes mellitus without complication ([place])    Fatty liver    Gallbladder polyp    Hepatic cyst    History of chicken pox    Hypertension    Migraine    After menses cycle.    Umbilical hernia 6160    Past Surgical History:  Procedure Laterality Date   CESAREAN SECTION     1993 & 2003   DILATION AND EVACUATION N/A 08/01/2015   Procedure: DILATATION AND EVACUATION With Tissue Sent For Chromosomal Analysis;  Surgeon: Servando Salina, MD;  Location: [facility];  Service: Gynecology;  Laterality: N/A;   HERNIA REPAIR  2007   OPERATIVE ULTRASOUND N/A 08/01/2015   Procedure: OPERATIVE ULTRASOUND;  Surgeon: Servando Salina, MD;  Location: [facility];  Service: Gynecology;  Laterality: N/A;    Prior to Admission medications   Medication Sig Start Date End Date Taking? Authorizing Provider  amLODipine (NORVASC) 10 MG tablet Take 1 tablet (10 mg total) by mouth daily. 02/07/21 05/08/21 Yes Skeet Latch, MD  furosemide (LASIX) 20 MG tablet TAKE ONE TAB DAILY AS NEEDED FOR WEIGHT GAIN OR SWELLING 04/16/21  Yes Skeet Latch, MD  glucose blood test strip Use to check blood sugars twice daily 03/12/21  Yes [name], MD  olmesartan (BENICAR) 20 MG tablet Take 1 tablet (20 mg total) by mouth daily. 04/10/21  Yes Skeet Latch, MD  Semaglutide,0.25 or 0.5MG /DOS, (OZEMPIC, 0.25 OR 0.5 MG/DOSE,) 2 MG/1.5ML SOPN Inject 0.5 mg into the skin once a week. 04/22/21  Yes [name], MD  spironolactone (ALDACTONE) 25 MG tablet Take 1 tablet (25 mg total) by mouth daily. 12/11/20  Yes Skeet Latch, MD  Ferrous Sulfate (IRON) 325 (65 Fe) MG TABS Take 1 tablet (325 mg total) by mouth daily. Patient not taking: Reported on 04/19/2021 07/13/19 03/03/22  Carrie Mew, MD  HOMEOPATHIC PRODUCTS PO Take by mouth. MegaFood Blood Builder - vitamin C 15 mg, folic acid 737 mcg, Vitamin B12 30 mcg, Iron 26 mg Patient not taking: Reported on 04/19/2021    [provider]    Allergies as of 04/04/2021 - Review Complete 03/29/2021  Allergen Reaction Noted   Lisinopril Shortness Of Breath 07/04/2014   Maxzide [hydrochlorothiazide w-triamterene] Other (See Comments) 08/05/2014   Vicodin [hydrocodone-acetaminophen] Palpitations 08/01/2015    Family History  Problem Relation Age of Onset   Hypertension Mother    Hyperlipidemia Father    Heart disease Father    Stroke Father    Diabetes Father    Hypertension Father    Heart attack Father    Hyperlipidemia Maternal Grandmother    Cancer Maternal Grandmother        Breast   Kidney disease Paternal Grandmother    Diabetes Paternal Grandmother    Hypertension Sister     Social History   Socioeconomic History   Marital status: Married    Spouse name: Not on file   Number of children: Not on file   Years of education: Not on file   Highest education level: Not on file  Occupational History   Not on file  Tobacco Use   Smoking status: Never   Smokeless tobacco: Never  Vaping Use   Vaping Use: Never used  Substance and Sexual Activity   Alcohol use: No   Drug use: No   Sexual activity: Yes  Other Topics Concern   Not on file  Social History Narrative   Not on file   Social Determinants of Health   Financial Resource Strain: Low Risk    Difficulty of Paying Living Expenses: Not hard at all  Food Insecurity: Not on file  Transportation Needs: Not on file  Physical Activity: Not on file  Stress: Not on file  Social Connections: Not on file  Intimate Partner Violence: Not on file    Review of Systems: See  HPI, otherwise negative ROS  Physical Exam: Constitutional: General:   Alert,  Well-developed, well-nourished, pleasant and cooperative in NAD BP (!) 160/91   Pulse 81   Temp (!) 97.1 F (36.2 C) (Temporal)   Resp 18   Ht 5\' 3"  (1.6 m)   Wt 114.1 kg   LMP 04/02/2021 (Exact Date)   SpO2 100%   BMI 44.56 kg/m   Head: Normocephalic, atraumatic.   Eyes:  Sclera clear, no icterus.   Conjunctiva pink.   Mouth:  No deformity or lesions, oropharynx pink & moist.  Neck:  Supple, trachea midline  Respiratory: Normal respiratory effort  Gastrointestinal:  Soft, non-tender and non-distended without masses, hepatosplenomegaly or hernias noted.  No guarding or rebound tenderness.     Cardiac: No clubbing or edema.  No cyanosis. Normal posterior tibial pedal pulses noted.  Lymphatic:  No significant cervical adenopathy.  Psych:  Alert and cooperative. Normal mood and affect.  Musculoskeletal:   Symmetrical without gross deformities. 5/5 Lower extremity strength bilaterally.  Skin: Warm. Intact without significant lesions or rashes. No jaundice.  Neurologic:  Face symmetrical, tongue midline, Normal sensation to touch;  grossly normal neurologically.  Psych:  Alert and oriented x3, Alert and cooperative. Normal mood and affect.  Impression/Plan: Nicole Burns is here for a colonoscopy to be performed for average risk screening.  Risks, benefits, limitations, and alternatives regarding  colonoscopy have been reviewed with the patient.  Questions have been answered.  All parties agreeable.   Virgel Manifold, MD  04/26/2021, 10:26 AM

## 2021-04-26 NOTE — Anesthesia Preprocedure Evaluation (Signed)
Anesthesia Evaluation  Patient identified by MRN, date of birth, ID band Patient awake    Reviewed: Allergy & Precautions, NPO status , Patient's Chart, lab work & pertinent test results  History of Anesthesia Complications Negative for: history of anesthetic complications  Airway Mallampati: III   Neck ROM: Full    Dental no notable dental hx.    Pulmonary neg pulmonary ROS,    Pulmonary exam normal breath sounds clear to auscultation       Cardiovascular hypertension, Normal cardiovascular exam Rhythm:Regular Rate:Normal     Neuro/Psych  Headaches,    GI/Hepatic negative GI ROS, Fatty liver   Endo/Other  diabetes, Type 2Class 3 obesity  Renal/GU negative Renal ROS     Musculoskeletal   Abdominal   Peds  Hematology  (+) Blood dyscrasia, anemia ,   Anesthesia Other Findings   Reproductive/Obstetrics                             Anesthesia Physical Anesthesia Plan  ASA: 3  Anesthesia Plan: General   Post-op Pain Management:    Induction: Intravenous  PONV Risk Score and Plan: 3 and Propofol infusion, TIVA and Treatment may vary due to age or medical condition  Airway Management Planned: Natural Airway  Additional Equipment:   Intra-op Plan:   Post-operative Plan:   Informed Consent: I have reviewed the patients History and Physical, chart, labs and discussed the procedure including the risks, benefits and alternatives for the proposed anesthesia with the patient or authorized representative who has indicated his/her understanding and acceptance.       Plan Discussed with: CRNA  Anesthesia Plan Comments:         Anesthesia Quick Evaluation

## 2021-04-26 NOTE — Transfer of Care (Signed)
Immediate Anesthesia Transfer of Care Note  Patient: Nicole Burns  Procedure(s) Performed: COLONOSCOPY WITH PROPOFOL  Patient Location: PACU  Anesthesia Type:General  Level of Consciousness: sedated  Airway & Oxygen Therapy: Patient Spontanous Breathing  Post-op Assessment: Report given to RN and Post -op Vital signs reviewed and stable  Post vital signs: Reviewed and stable  Last Vitals:  Vitals Value Taken Time  BP 144/75 04/26/21 1110  Temp 36.2 C 04/26/21 1110  Pulse 86 04/26/21 1110  Resp 32 04/26/21 1110  SpO2 95 % 04/26/21 1110  Vitals shown include unvalidated device data.  Last Pain:  Vitals:   04/26/21 1110  TempSrc: Temporal  PainSc: Asleep         Complications: No notable events documented.

## 2021-04-26 NOTE — Op Note (Signed)
[facility] Gastroenterology Patient Name: Nicole Burns Procedure Date: 04/26/2021 10:35 AM MRN: [id number] Account #: [id number] Date of Birth: [birth year] Admit Type: Outpatient Age: 45 Room: [facility] ENDO ROOM 2 Gender: Female Note Status: Finalized Instrument Name: Jasper Riling [id number] Procedure:             Colonoscopy Indications:           Screening for colorectal malignant neoplasm Providers:             Ezri Landers B. Bonna Gains MD, MD Referring MD:          Deborra Medina, MD (Referring MD) Medicines:             Monitored Anesthesia Care Complications:         No immediate complications. Procedure:             Pre-Anesthesia Assessment:                        - ASA Grade Assessment: II - A patient with mild                         systemic disease.                        - Prior to the procedure, a History and Physical was                         performed, and patient medications, allergies and                         sensitivities were reviewed. The patient's tolerance                         of previous anesthesia was reviewed.                        - The risks and benefits of the procedure and the                         sedation options and risks were discussed with the                         patient. All questions were answered and informed                         consent was obtained.                        - Patient identification and proposed procedure were                         verified prior to the procedure by the physician, the                         nurse, the anesthesiologist, the anesthetist and the                         technician. The procedure was verified in the  procedure room.                        After obtaining informed consent, the colonoscope was                         passed under direct vision. Throughout the procedure,                         the patient's blood pressure, pulse, and oxygen                          saturations were monitored continuously. The                         Colonoscope was introduced through the anus and                         advanced to the the cecum, identified by appendiceal                         orifice and ileocecal valve. The colonoscopy was                         performed with ease. The patient tolerated the                         procedure well. The quality of the bowel preparation                         was good. Findings:      The perianal exam findings include non-thrombosed external hemorrhoids.      A 5 mm polyp was found in the sigmoid colon. The polyp was sessile. The       polyp was removed with a cold snare. Resection and retrieval were       complete.      Multiple diverticula were found in the sigmoid colon.      The exam was otherwise without abnormality.      The rectum, sigmoid colon, descending colon, transverse colon, ascending       colon and cecum appeared normal.      Non-bleeding internal hemorrhoids were found during retroflexion. Impression:            - Non-thrombosed external hemorrhoids found on                         perianal exam.                        - One 5 mm polyp in the sigmoid colon, removed with a                         cold snare. Resected and retrieved.                        - Diverticulosis in the sigmoid colon.                        - The examination was otherwise normal.                        -  The rectum, sigmoid colon, descending colon,                         transverse colon, ascending colon and cecum are normal.                        - Non-bleeding internal hemorrhoids. Recommendation:        - Discharge patient to home (with escort).                        - Advance diet as tolerated.                        - Continue present medications.                        - Await pathology results.                        - Repeat colonoscopy date to be determined after                         pending  pathology results are reviewed.                        - The findings and recommendations were discussed with                         the patient.                        - The findings and recommendations were discussed with                         the patient's family.                        - Return to primary care physician as previously                         scheduled.                        - High fiber diet. Procedure Code(s):     --- Professional ---                        [phone], Colonoscopy, flexible; with removal of                         tumor(s), polyp(s), or other lesion(s) by snare                         technique Diagnosis Code(s):     --- Professional ---                        Z12.11, Encounter for screening for malignant neoplasm                         of colon  K63.5, Polyp of colon CPT copyright 2019 American Medical Association. All rights reserved. The codes documented in this report are preliminary and upon coder review may  be revised to meet current compliance requirements.  [name], MD Margretta Sidle B. Bonna Gains MD, MD 04/26/2021 11:09:10 AM This report has been signed electronically. Number of Addenda: 0 Note Initiated On: 04/26/2021 10:35 AM Scope Withdrawal Time: 0 hours 16 minutes 59 seconds  Total Procedure Duration: 0 hours 21 minutes 17 seconds  Estimated Blood Loss:  Estimated blood loss: none.      [facility]

## 2021-04-26 NOTE — Anesthesia Postprocedure Evaluation (Signed)
Anesthesia Post Note  Patient: Nicole Burns  Procedure(s) Performed: COLONOSCOPY WITH PROPOFOL  Patient location during evaluation: PACU Anesthesia Type: General Level of consciousness: awake and alert, oriented and patient cooperative Pain management: pain level controlled Vital Signs Assessment: post-procedure vital signs reviewed and stable Respiratory status: spontaneous breathing, nonlabored ventilation and respiratory function stable Cardiovascular status: blood pressure returned to baseline and stable Postop Assessment: adequate PO intake Anesthetic complications: no   No notable events documented.   Last Vitals:  Vitals:   04/26/21 1120 04/26/21 1130  BP: 123/74 134/72  Pulse: 73 66  Resp: (!) 28 20  Temp:    SpO2: 100% 100%    Last Pain:  Vitals:   04/26/21 1130  TempSrc:   PainSc: 0-No pain                 Darrin Nipper

## 2021-04-29 ENCOUNTER — Encounter: Payer: Self-pay | Admitting: Gastroenterology

## 2021-04-29 LAB — SURGICAL PATHOLOGY

## 2021-05-06 NOTE — Progress Notes (Signed)
Program ended 05/02/2021; did not complete final assessment. Educations sessions attended: 6 Workouts completed: 4

## 2021-05-07 ENCOUNTER — Encounter: Payer: Self-pay | Admitting: Gastroenterology

## 2021-05-20 ENCOUNTER — Encounter: Payer: Self-pay | Admitting: Internal Medicine

## 2021-05-20 ENCOUNTER — Other Ambulatory Visit: Payer: Self-pay

## 2021-05-20 ENCOUNTER — Ambulatory Visit (INDEPENDENT_AMBULATORY_CARE_PROVIDER_SITE_OTHER): Payer: Managed Care, Other (non HMO) | Admitting: Internal Medicine

## 2021-05-20 VITALS — BP 128/76 | HR 98 | Temp 96.5°F | Ht 63.0 in | Wt 242.2 lb

## 2021-05-20 DIAGNOSIS — E1169 Type 2 diabetes mellitus with other specified complication: Secondary | ICD-10-CM | POA: Diagnosis not present

## 2021-05-20 DIAGNOSIS — E669 Obesity, unspecified: Secondary | ICD-10-CM | POA: Diagnosis not present

## 2021-05-20 DIAGNOSIS — I10 Essential (primary) hypertension: Secondary | ICD-10-CM | POA: Diagnosis not present

## 2021-05-20 DIAGNOSIS — D500 Iron deficiency anemia secondary to blood loss (chronic): Secondary | ICD-10-CM

## 2021-05-20 DIAGNOSIS — N920 Excessive and frequent menstruation with regular cycle: Secondary | ICD-10-CM

## 2021-05-20 LAB — COMPREHENSIVE METABOLIC PANEL WITH GFR
ALT: 9 U/L (ref 0–35)
AST: 10 U/L (ref 0–37)
Albumin: 4.1 g/dL (ref 3.5–5.2)
Alkaline Phosphatase: 60 U/L (ref 39–117)
BUN: 11 mg/dL (ref 6–23)
CO2: 24 meq/L (ref 19–32)
Calcium: 9.5 mg/dL (ref 8.4–10.5)
Chloride: 104 meq/L (ref 96–112)
Creatinine, Ser: 0.88 mg/dL (ref 0.40–1.20)
GFR: 78.16 mL/min (ref >60.00)
Glucose, Bld: 92 mg/dL (ref 70–99)
Potassium: 3.7 meq/L (ref 3.5–5.1)
Sodium: 134 meq/L — ABNORMAL LOW (ref 135–145)
Total Bilirubin: 0.5 mg/dL (ref 0.2–1.2)
Total Protein: 7.7 g/dL (ref 6.0–8.3)

## 2021-05-20 LAB — CBC WITH DIFFERENTIAL/PLATELET
Basophils Absolute: 0.1 10*3/uL (ref 0.0–0.1)
Basophils Relative: 0.8 % (ref 0.0–3.0)
Eosinophils Absolute: 0.1 10*3/uL (ref 0.0–0.7)
Eosinophils Relative: 1.4 % (ref 0.0–5.0)
HCT: 29.5 % — ABNORMAL LOW (ref 36.0–46.0)
Hemoglobin: 8.8 g/dL — ABNORMAL LOW (ref 12.0–15.0)
Lymphocytes Relative: 25.9 % (ref 12.0–46.0)
Lymphs Abs: 2.2 10*3/uL (ref 0.7–4.0)
MCHC: 30 g/dL (ref 30.0–36.0)
MCV: 70.3 fl — ABNORMAL LOW (ref 78.0–100.0)
Monocytes Absolute: 1.2 10*3/uL — ABNORMAL HIGH (ref 0.1–1.0)
Monocytes Relative: 13.5 % — ABNORMAL HIGH (ref 3.0–12.0)
Neutro Abs: 5.1 10*3/uL (ref 1.4–7.7)
Neutrophils Relative %: 58.4 % (ref 43.0–77.0)
Platelets: 403 10*3/uL — ABNORMAL HIGH (ref 150.0–400.0)
RBC: 4.19 Mil/uL (ref 3.87–5.11)
RDW: 17.2 % — ABNORMAL HIGH (ref 11.5–15.5)
WBC: 8.6 10*3/uL (ref 4.0–10.5)

## 2021-05-20 LAB — MICROALBUMIN / CREATININE URINE RATIO
Creatinine,U: 167 mg/dL
Microalb Creat Ratio: 0.6 mg/g (ref 0.0–30.0)
Microalb, Ur: 1 mg/dL (ref 0.0–1.9)

## 2021-05-20 LAB — LIPID PANEL
Cholesterol: 155 mg/dL (ref 0–200)
HDL: 44.3 mg/dL (ref >39.00)
LDL Cholesterol: 100 mg/dL — ABNORMAL HIGH (ref 0–99)
NonHDL: 110.61
Total CHOL/HDL Ratio: 3
Triglycerides: 55 mg/dL (ref 0.0–149.0)
VLDL: 11 mg/dL (ref 0.0–40.0)

## 2021-05-20 LAB — HEMOGLOBIN A1C: Hgb A1c MFr Bld: 5.7 % (ref 4.6–6.5)

## 2021-05-20 NOTE — Progress Notes (Signed)
Subjective:  Patient ID: Nicole Burns, female    DOB: 05-02-1976  Age: 45 y.o. MRN: [id number]  CC: The primary encounter diagnosis was Diabetes mellitus type 2 in obese ([place]). Diagnoses of Essential hypertension, benign, Iron deficiency anemia due to chronic blood loss, Obesity, morbid ([place]), and Menorrhagia with regular cycle were also pertinent to this visit.  HPI [name] presents for diabetes follow up Chief Complaint  Patient presents with   Follow-up    3 month follow up on diabetes   This visit occurred during the SARS-CoV-2 public health emergency.  Safety protocols were in place, including screening questions prior to the visit, additional usage of staff PPE, and extensive cleaning of exam room while observing appropriate contact time as indicated for disinfecting solutions.   1) type 2 DM with morbid obesity.  Since her last visit , She has Has lost 10 lb s with ozempic .    Side effects of nausea have been somewhat relieved with adminisitration in the thigh.   Not walking daily due to busy schedule working full time at [employer]. . Checking  blood sugars less than once daily at variable times, usually only if she feels she may be having a hypoglycemic event. .  BS have been under 130 fasting and < 150 post prandially.  Denies any recent hypoglyemic events.  Taking   medications as directed. Following a carbohydrate modified diet 6 days per week. Denies numbness, burning and tingling of extremities. Appetite is diminished since starting Ozempic . Family is very supportive of her efforts.    2) HTN:  stopped omlesartan  due to concurrent onset of diarrhea and heartburn. .  Taking spironolactone and amlodipine . Home readings  have been <140/80   3) IDA:  following up with gynecology for menorrhagia.     Lab Results  Component Value Date   MICROALBUR 1.0 05/20/2021   MICROALBUR 6.6 (H) 08/18/2019      Outpatient Medications Prior to Visit  Medication Sig Dispense  Refill   furosemide (LASIX) 20 MG tablet TAKE ONE TAB DAILY AS NEEDED FOR WEIGHT GAIN OR SWELLING 30 tablet 3   glucose blood test strip Use to check blood sugars twice daily 200 each 3   HOMEOPATHIC PRODUCTS PO Take by mouth. MegaFood Blood Builder - vitamin C 15 mg, folic acid 256 mcg, Vitamin B12 30 mcg, Iron 26 mg     Semaglutide,0.25 or 0.5MG /DOS, (OZEMPIC, 0.25 OR 0.5 MG/DOSE,) 2 MG/1.5ML SOPN Inject 0.5 mg into the skin once a week. 1.5 mL 2   spironolactone (ALDACTONE) 25 MG tablet Take 1 tablet (25 mg total) by mouth daily. 90 tablet 3   amLODipine (NORVASC) 10 MG tablet Take 1 tablet (10 mg total) by mouth daily. 90 tablet 1   Ferrous Sulfate (IRON) 325 (65 Fe) MG TABS Take 1 tablet (325 mg total) by mouth daily. (Patient not taking: No sig reported) 60 tablet 0   olmesartan (BENICAR) 20 MG tablet Take 1 tablet (20 mg total) by mouth daily. (Patient not taking: Reported on 05/20/2021) 30 tablet 6   No facility-administered medications prior to visit.    Review of Systems;  Patient denies headache, fevers, malaise, unintentional weight loss, skin rash, eye pain, sinus congestion and sinus pain, sore throat, dysphagia,  hemoptysis , cough, dyspnea, wheezing, chest pain, palpitations, orthopnea, edema, abdominal pain, nausea, melena, diarrhea, constipation, flank pain, dysuria, hematuria, urinary  Frequency, nocturia, numbness, tingling, seizures,  Focal weakness, Loss of consciousness,  Tremor, insomnia, depression,  anxiety, and suicidal ideation.      Objective:  BP 128/76 (BP Location: Left Arm, Patient Position: Sitting, Cuff Size: Normal)   Pulse 98   Temp (!) 96.5 F (35.8 C) (Temporal)   Ht 5\' 3"  (1.6 m)   Wt 242 lb 3.2 oz (109.9 kg)   SpO2 96%   BMI 42.90 kg/m   BP Readings from Last 3 Encounters:  05/20/21 128/76  04/26/21 134/72  04/10/21 (!) 148/84    Wt Readings from Last 3 Encounters:  05/20/21 242 lb 3.2 oz (109.9 kg)  04/26/21 251 lb 8.4 oz (114.1 kg)   04/10/21 250 lb 9.6 oz (113.7 kg)    General appearance: alert, cooperative and appears stated age Ears: normal TM's and external ear canals both ears Throat: lips, mucosa, and tongue normal; teeth and gums normal Neck: no adenopathy, no carotid bruit, supple, symmetrical, trachea midline and thyroid not enlarged, symmetric, no tenderness/mass/nodules Back: symmetric, no curvature. ROM normal. No CVA tenderness. Lungs: clear to auscultation bilaterally Heart: regular rate and rhythm, S1, S2 normal, no murmur, click, rub or gallop Abdomen: soft, non-tender; bowel sounds normal; no masses,  no organomegaly Pulses: 2+ and symmetric Skin: Skin color, texture, turgor normal. No rashes or lesions Lymph nodes: Cervical, supraclavicular, and axillary nodes normal.  Lab Results  Component Value Date   HGBA1C 5.7 05/20/2021   HGBA1C 7.0 (H) 02/07/2021   HGBA1C 5.5 08/18/2019    Lab Results  Component Value Date   CREATININE 0.88 05/20/2021   CREATININE 1.00 04/24/2021   CREATININE 0.86 02/07/2021    Lab Results  Component Value Date   WBC 8.6 05/20/2021   HGB 8.8 Repeated and verified X2. (L) 05/20/2021   HCT 29.5 (L) 05/20/2021   PLT 403.0 (H) 05/20/2021   GLUCOSE 92 05/20/2021   CHOL 155 05/20/2021   TRIG 55.0 05/20/2021   HDL 44.30 05/20/2021   LDLCALC 100 (H) 05/20/2021   ALT 9 05/20/2021   AST 10 05/20/2021   NA 134 (L) 05/20/2021   K 3.7 05/20/2021   CL 104 05/20/2021   CREATININE 0.88 05/20/2021   BUN 11 05/20/2021   CO2 24 05/20/2021   TSH 2.05 08/18/2019   HGBA1C 5.7 05/20/2021   MICROALBUR 1.0 05/20/2021    No results found.  Assessment & Plan:   Problem List Items Addressed This Visit     Diabetes mellitus type 2 in obese ([place]) - Primary    She is tolerating low dose of Ozempic and losing weight  .  Metformin refused by patient due to  hearing that it was "bad on your kidneys"     ARB prescribed but stopped by patient due to coincidental  episode of  diarrhea   Lab Results  Component Value Date   HGBA1C 5.7 05/20/2021   Lab Results  Component Value Date   MICROALBUR 1.0 05/20/2021   MICROALBUR 6.6 (H) 08/18/2019           Relevant Orders   Microalbumin / creatinine urine ratio (Completed)   Comprehensive metabolic panel (Completed)   Hemoglobin A1c (Completed)   Lipid panel (Completed)   Essential hypertension, benign    Did not tolerate olmesartan due to nausea and diarrhea.  BP improved with weight loss . Continue spironlactone and  Amlodipine.  Recheck urine for proteinuria today       Iron deficiency anemia due to chronic blood loss    Secondary to menorrhagia  Her hgb has again dropped in spite of improvement in  iron stores.   colononoscopy was negative for masses in  early October and she is reminded again to discuss treatment for menorrhagia with her gynecologist   Lab Results  Component Value Date   WBC 8.6 05/20/2021   HGB 8.8 Repeated and verified X2. (L) 05/20/2021   HCT 29.5 (L) 05/20/2021   MCV 70.3 (L) 05/20/2021   PLT 403.0 (H) 05/20/2021   Lab Results  Component Value Date   IRON 153 05/20/2021   TIBC 419 05/20/2021   FERRITIN 4 (L) 05/20/2021         Relevant Orders   CBC with Differential/Platelet (Completed)   Iron, TIBC and Ferritin Panel (Completed)   Menorrhagia    Resulting in severe iron deficiency , recurrent. Marland Kitchen  Hysterectomy advised but has not followed up with GYN despite requiring transfusion earlier in the year       Obesity, morbid ([place])    With fatty liver, diabetes and hypertension .  Improving BMI with Ozempic.  encoraged to start exercising        I provided  30 minutes  during this encounter reviewing patient's current problems and past surgeries, labs and imaging studies, providing counseling on the above mentioned problems I na face to face visit  , and coordination  of care .   Medications Discontinued During This Encounter  Medication Reason   Ferrous Sulfate  (IRON) 325 (65 Fe) MG TABS    olmesartan (BENICAR) 20 MG tablet     Follow-up: Return in about 6 months (around 11/17/2021) for follow up diabetes.   [name], MD

## 2021-05-20 NOTE — Assessment & Plan Note (Addendum)
She is tolerating low dose of Ozempic and losing weight  .  Metformin refused by patient due to  hearing that it was "bad on your kidneys"     ARB prescribed but stopped by patient due to coincidental  episode of diarrhea   Lab Results  Component Value Date   HGBA1C 5.7 05/20/2021   Lab Results  Component Value Date   MICROALBUR 1.0 05/20/2021   MICROALBUR 6.6 (H) 08/18/2019

## 2021-05-20 NOTE — Assessment & Plan Note (Signed)
Did not tolerate olmesartan due to nausea and diarrhea.  BP improved with weight loss . Continue spironlactone and  Amlodipine.  Recheck urine for proteinuria today

## 2021-05-20 NOTE — Patient Instructions (Signed)
Congratulations!   You're doing great!   I recommend getting the flu and the pneumonia vaccines this fall , as well as the COVID booster   Add a 30 minute walk to your daily routine whenever possible    See you in 6 months ([place] in 3)

## 2021-05-21 ENCOUNTER — Telehealth: Payer: Self-pay

## 2021-05-21 LAB — IRON,TIBC AND FERRITIN PANEL
%SAT: 37 % (calc) (ref 16–45)
Ferritin: 4 ng/mL — ABNORMAL LOW (ref 16–232)
Iron: 153 ug/dL (ref 40–190)
TIBC: 419 mcg/dL (calc) (ref 250–450)

## 2021-05-21 NOTE — Assessment & Plan Note (Signed)
Resulting in severe iron deficiency , recurrent. Marland Kitchen  Hysterectomy advised but has not followed up with GYN despite requiring transfusion earlier in the year

## 2021-05-21 NOTE — Assessment & Plan Note (Signed)
With fatty liver, diabetes and hypertension .  Improving BMI with Ozempic.  encoraged to start exercising

## 2021-05-21 NOTE — Telephone Encounter (Signed)
-----   Message from [name], MD sent at 05/21/2021 10:40 AM EDT ----- Her diabetes labs are   now  excellent, but her anemia has worsened and hgv is now under 9 .  Remind her that she required a transfusion earlier this year and is approaching the need for that again ,  the cause is her heavy periods.   1) is she taking iron?   2) when is she going to follow up  with her gynecologist?

## 2021-05-21 NOTE — Assessment & Plan Note (Signed)
Secondary to menorrhagia  Her hgb has again dropped in spite of improvement in iron stores.   colononoscopy was negative for masses in  early October and she is reminded again to discuss treatment for menorrhagia with her gynecologist   Lab Results  Component Value Date   WBC 8.6 05/20/2021   HGB 8.8 Repeated and verified X2. (L) 05/20/2021   HCT 29.5 (L) 05/20/2021   MCV 70.3 (L) 05/20/2021   PLT 403.0 (H) 05/20/2021   Lab Results  Component Value Date   IRON 153 05/20/2021   TIBC 419 05/20/2021   FERRITIN 4 (L) 05/20/2021

## 2021-06-11 ENCOUNTER — Other Ambulatory Visit: Payer: Self-pay

## 2021-06-11 ENCOUNTER — Ambulatory Visit (INDEPENDENT_AMBULATORY_CARE_PROVIDER_SITE_OTHER): Payer: Managed Care, Other (non HMO) | Admitting: Pharmacist Clinician (PhC)/ Clinical Pharmacy Specialist

## 2021-06-11 DIAGNOSIS — I10 Essential (primary) hypertension: Secondary | ICD-10-CM

## 2021-06-11 NOTE — Patient Instructions (Signed)
  Check your blood pressure at home 2-3 days each week and keep record of the readings.  Try to keep it averaging 130/80 or below.  Feel free to reach out to me via MyChart if you have any questions or concerns  Take your BP meds as follows:  No change in medications today.   Bring all of your meds, your BP cuff and your record of home blood pressures to your next appointment.  Exercise as you're able, try to walk approximately 30 minutes per day.  Keep salt intake to a minimum, especially watch canned and prepared boxed foods.  Eat more fresh fruits and vegetables and fewer canned items.  Avoid eating in fast food restaurants.    HOW TO TAKE YOUR BLOOD PRESSURE: Rest 5 minutes before taking your blood pressure.  Don't smoke or drink caffeinated beverages for at least 30 minutes before. Take your blood pressure before (not after) you eat. Sit comfortably with your back supported and both feet on the floor (don't cross your legs). Elevate your arm to heart level on a table or a desk. Use the proper sized cuff. It should fit smoothly and snugly around your bare upper arm. There should be enough room to slip a fingertip under the cuff. The bottom edge of the cuff should be 1 inch above the crease of the elbow. Ideally, take 3 measurements at one sitting and record the average.

## 2021-06-11 NOTE — Progress Notes (Signed)
06/12/2021 [name] 12/25/1975 [id number]   HPI:  Nicole Burns is a 45 y.o. female patient of Dr Oval Linsey, who presents today for advanced hypertension clinic follow up.  In addition to hypertension her medical history is significant for DM2, anemia and obesity. Originally seen by Dr. Oval Linsey in May, her pressure at the time was noted to be 194/110.   She noted that she has had problems with hypertension since her first pregnancy, close to 30 years ago.   Spironolactone was added to her amlodipine by Dr. Oval Linsey.  At a follow up 2 months later her office reading was improved at 152/88.  Amlodipine was increased to 10 mg and metabolic panel was checked.  Labs were stable, with potassium up to 4.4.  A1c was also checked that day and found to be at 7.0.  When I saw her in September we added olmesartan 20 mg daily.  Unfortunately this caused her to have both diarrhea and heartburn, and she stopped after just a week or so on the medication.     Today she returns for follow up.  She has been on Ozempic for just over a month, has lost 10 pounds.  She has not been able to tolerate the 0.5 mg dose, so is currently taking somewhere between the 0.25 and 0.5 mg dose.  She notes that she is eating less, and especially has problems with GI upset when the foods she eats are greasy or high in fat content.    Blood Pressure Goal:  130/80  Current Medications: amlodipine 10 mg qd, (am) spironolactone 25 mg qd (am),   Family Hx:   father with CABG, died later from [place] at 48; mother living now, healthy w. Htn; 1 sister with hypertension; 2 daughters, 21, 52, second with hypertension  Social Hx: no tobacco, no alcohol, 1 coke zero per day  Diet: trying to watch sodium intake, doesn't eat out much, no deep fried; smokes or grills most foods, occasional air fried; vegetables mostly frozen; canned only if rinsed  Exercise: not much,   Home BP readings: did not bring home readings, but notes they  have improved since last visit.  States mostly 120 and low 498 systolic readings with most diastolic now < 80.     Intolerances: lisinopril - SOB, olmesartan - diarrhea/heartburn  Labs: 7/22:  Na 135, K 4.4, Glu 119, BUN 10, SCr 0.86 eGFR 84   Wt Readings from Last 3 Encounters:  06/11/21 242 lb (109.8 kg)  05/20/21 242 lb 3.2 oz (109.9 kg)  04/26/21 251 lb 8.4 oz (114.1 kg)   BP Readings from Last 3 Encounters:  06/11/21 136/82  05/20/21 128/76  04/26/21 134/72   Pulse Readings from Last 3 Encounters:  06/11/21 84  05/20/21 98  04/26/21 66    Current Outpatient Medications  Medication Sig Dispense Refill   amLODipine (NORVASC) 10 MG tablet Take 1 tablet (10 mg total) by mouth daily. 90 tablet 1   furosemide (LASIX) 20 MG tablet TAKE ONE TAB DAILY AS NEEDED FOR WEIGHT GAIN OR SWELLING 30 tablet 3   glucose blood test strip Use to check blood sugars twice daily 200 each 3   HOMEOPATHIC PRODUCTS PO Take by mouth. Mega[employer] Blood Builder - vitamin C 15 mg, folic acid 264 mcg, Vitamin B12 30 mcg, Iron 26 mg     Semaglutide,0.25 or 0.5MG/DOS, (OZEMPIC, 0.25 OR 0.5 MG/DOSE,) 2 MG/1.5ML SOPN Inject 0.5 mg into the skin once a week. 1.5 mL 2  spironolactone (ALDACTONE) 25 MG tablet Take 1 tablet (25 mg total) by mouth daily. 90 tablet 3   No current facility-administered medications for this visit.    Allergies  Allergen Reactions   Lisinopril Shortness Of Breath   Maxzide [Hydrochlorothiazide W-Triamterene] Other (See Comments)    Joint pain,  Malaise and bad mood   Vicodin [Hydrocodone-Acetaminophen] Palpitations    Past Medical History:  Diagnosis Date   Abortion, missed 08/09/2015   Diabetes mellitus without complication ([facility])    Fatty liver    Gallbladder polyp    Hepatic cyst    History of chicken pox    Hypertension    Migraine    After menses cycle.    Umbilical hernia 1572    Blood pressure 136/82, pulse 84, resp. rate 16, height 5' 3"  (1.6 m), weight 242 lb  (109.8 kg), last menstrual period 06/04/2021, SpO2 100 %.  Essential hypertension, benign Patient with essential hypertension, closer to goal in office today.  She notes home readings are usually lower than this.  Because of this, as well as the fact that she has recently lost 10 pounds since starting Ozempic, we will leave her medications unchanged.  She was praised on weight loss and dietary changes, and encouraged to continue with her current plan.  Also encouraged starting exercise with short 10-15 minute walks most days of the week, as she currently does not exercise.  Advised that she continue with home monitoring 2-3 days per week and reach out to [place] via MyChart if she has any questions or concerns.     Tommy Medal PharmD CPP [facility] [address] [place], [place] [address] [phone]

## 2021-06-12 ENCOUNTER — Encounter: Payer: Self-pay | Admitting: Pharmacist Clinician (PhC)/ Clinical Pharmacy Specialist

## 2021-06-12 NOTE — Assessment & Plan Note (Signed)
Patient with essential hypertension, closer to goal in office today.  She notes home readings are usually lower than this.  Because of this, as well as the fact that she has recently lost 10 pounds since starting Ozempic, we will leave her medications unchanged.  She was praised on weight loss and dietary changes, and encouraged to continue with her current plan.  Also encouraged starting exercise with short 10-15 minute walks most days of the week, as she currently does not exercise.  Advised that she continue with home monitoring 2-3 days per week and reach out to [place] via MyChart if she has any questions or concerns.

## 2021-06-18 ENCOUNTER — Ambulatory Visit: Payer: Managed Care, Other (non HMO) | Admitting: Pharmacist

## 2021-06-18 DIAGNOSIS — E1169 Type 2 diabetes mellitus with other specified complication: Secondary | ICD-10-CM

## 2021-06-18 DIAGNOSIS — I10 Essential (primary) hypertension: Secondary | ICD-10-CM

## 2021-06-18 NOTE — Patient Instructions (Signed)
Nicole Burns,   It was great talking to you today! Congratulations on your weight loss, getting your A1c to goal, and improving your blood pressure.   I agree with the pharmacist at the cardiology office, let's try increasing Ozempic to 0.5 mg after the holidays.   Call your eye doctor and have them fax your eye exam results to our office - [phone].   Take care!  Catie Darnelle Maffucci, PharmD  Visit Information  Following are the goals we discussed today:  Patient Goals/Self-Care Activities Over the next 90 days, patient will:  - take medications as prescribed check glucose daily, document, and provide at future appointments check blood pressure periodically, document, and provide at future appointments          Plan: Telephone follow up appointment with care management team member scheduled for:  6 weeks   Catie Darnelle Maffucci, PharmD, [place], CPP Clinical Pharmacist [place] at [facility] [phone]   Please call the care guide team at [phone] if you need to cancel or reschedule your appointment.   Patient verbalizes understanding of instructions provided today and agrees to view in [place].

## 2021-06-18 NOTE — Chronic Care Management (AMB) (Signed)
Chronic Care Management CCM Pharmacy Note  06/18/2021 Name:  Nicole Burns MRN:  [id number] DOB:  12/26/1975  Summary: - A1c at goal. Tolerating Ozempic 2.45 mg + 10 clicks if she avoids fatty foods.  Recommendations/Changes made from today's visit: - Continue current regimen. Consider Ozempic titration to 0.5 mg moving forward - Recommend moderate to high intensity statin in patient with DM, HTN, and 10 year ASCVD risk 9%  Subjective: Nicole Burns is an 45 y.o. year old female who is a primary patient of Tullo, Aris Everts, MD.  The CCM team was consulted for assistance with disease management and care coordination needs.    Engaged with patient by telephone for follow up visit for pharmacy case management and/or care coordination services.   Objective:  Medications Reviewed Today     Reviewed by De Hollingshead, RPH-CPP (Pharmacist) on 06/18/21 at [id number]  Med List Status: <None>   Medication Order Taking? Sig Documenting Provider Last Dose Status Informant  amLODipine (NORVASC) 10 MG tablet [id number] Yes Take 1 tablet (10 mg total) by mouth daily. Skeet Latch, MD Taking Active   furosemide (LASIX) 20 MG tablet [id number] No TAKE ONE TAB DAILY AS NEEDED FOR WEIGHT GAIN OR SWELLING  Patient not taking: Reported on 06/18/2021   Skeet Latch, MD Not Taking Active   glucose blood test strip [id number] Yes Use to check blood sugars twice daily [name], MD Taking Active   HOMEOPATHIC PRODUCTS PO [id number] Yes Take by mouth. MegaFood Blood Builder - vitamin C 15 mg, folic acid 834 mcg, Vitamin B12 30 mcg, Iron 26 mg [provider] Taking Active   Semaglutide,0.25 or 0.5MG /DOS, (OZEMPIC, 0.25 OR 0.5 MG/DOSE,) 2 MG/1.5ML SOPN [id number] Yes Inject 0.5 mg into the skin once a week. [name], MD Taking Active            Med Note ([place] Jun 18, 2021  8:33 AM) 8.92 mg + 10 clicks  spironolactone (ALDACTONE) 25 MG tablet [id number] Yes  Take 1 tablet (25 mg total) by mouth daily. Skeet Latch, MD Taking Active             Pertinent Labs:   Lab Results  Component Value Date   HGBA1C 5.7 05/20/2021   Lab Results  Component Value Date   CHOL 155 05/20/2021   HDL 44.30 05/20/2021   LDLCALC 100 (H) 05/20/2021   TRIG 55.0 05/20/2021   CHOLHDL 3 05/20/2021   Lab Results  Component Value Date   CREATININE 0.88 05/20/2021   BUN 11 05/20/2021   NA 134 (L) 05/20/2021   K 3.7 05/20/2021   CL 104 05/20/2021   CO2 24 05/20/2021    SDOH:  (Social Determinants of Health) assessments and interventions performed:  SDOH Interventions    Flowsheet Row Most Recent Value  SDOH Interventions   Financial Strain Interventions Intervention Not Indicated       CCM Care Plan  Review of patient past medical history, allergies, medications, health status, including review of consultants reports, laboratory and other test data, was performed as part of comprehensive evaluation and provision of chronic care management services.   Care Plan : Medication Management  Updates made by De Hollingshead, RPH-CPP since 06/18/2021 12:00 AM     Problem: Diabetes, HTN,      Long-Range Goal: Disease Progression Prevention   Start Date: 03/12/2021  Recent Progress: On track  Priority: High  Note:   Current Barriers:  Unable to  achieve control of diabetes   Pharmacist Clinical Goal(s):  Over the next 90 days, patient will achieve control of diabetes as evidenced by A1c  through collaboration with PharmD and provider.   Interventions: 1:1 collaboration with [name], MD regarding development and update of comprehensive plan of care as evidenced by provider attestation and co-signature Inter-disciplinary care team collaboration (see longitudinal plan of care) Comprehensive medication review performed; medication list updated in electronic medical record  Health Maintenance   Yearly diabetic eye exam: due - reports  she had this done, but we have not received from [facility]. Asked her to have them send the results to [place] Yearly diabetic foot exam: due - placed in appointment notes for provider Urine microalbumin: up to date Yearly influenza vaccination: due - patient declines Td/Tdap vaccination: up to date Pneumonia vaccination: due - patient declines COVID vaccinations: due - patient declines Colonoscopy: up to date  Diabetes: Controlled; current treatment: Ozempic 0.96 mg + 10 clicks  Reports indigestion if she eats beef, fatty foods, so she tries to avoid these. Taking Pepto Bismol with GI upset, little benefit from H2RA for acid reflux.  Most recent weight: 242 lbs; baseline weight: 257 lbs; total weight lost from baseline: 15 lbs - 5% Current glucose readings: has not been checking recently Current exercise: In HTN program with the [employer] Tues/Thurs; cardio and strength training She notes that HTN PharmD suggested she titrate Ozempic, but after the holidays. Discussed, agree with this recommendation. Continue current regimen at this time  Hypertension: Last BP check at goal <140/80; current treatment: amlodipine 10 mg daily, spironolactone 25 mg daily Hx olmesartan - diarrhea Plan from HTN Clinic to continue to focus on weight loss to positively impact BP. Continue current regimen at this time  Hyperlipidemia:   Untreated; current treatment: none;  Medications previously tried: none  10 year ASCVD risk: 9.4%;  Discussed recommendation for statin therapy for primary prevention in DM. Discussed side effects. Encouraged patient to consider moving forward.   Patient Goals/Self-Care Activities Over the next 90 days, patient will:  - take medications as prescribed check glucose daily, document, and provide at future appointments check blood pressure periodically, document, and provide at future appointments       Plan: Telephone follow up appointment with care management team member scheduled  for:  6 weeks  Catie Darnelle Maffucci, PharmD, [place], [place] Pharmacist [employer] at [employer] [phone]

## 2021-08-16 ENCOUNTER — Other Ambulatory Visit: Payer: Self-pay | Admitting: Cardiovascular Disease

## 2021-08-16 ENCOUNTER — Telehealth (HOSPITAL_BASED_OUTPATIENT_CLINIC_OR_DEPARTMENT_OTHER): Payer: Self-pay | Admitting: Cardiovascular Disease

## 2021-08-16 NOTE — Telephone Encounter (Signed)
Left message for patient to call and schedule overdue follow up with Dr. [place] 

## 2021-08-20 ENCOUNTER — Ambulatory Visit: Payer: Managed Care, Other (non HMO) | Admitting: Pharmacist

## 2021-08-20 ENCOUNTER — Telehealth: Payer: Self-pay | Admitting: Internal Medicine

## 2021-08-20 VITALS — Wt 236.0 lb

## 2021-08-20 DIAGNOSIS — E1169 Type 2 diabetes mellitus with other specified complication: Secondary | ICD-10-CM

## 2021-08-20 DIAGNOSIS — D500 Iron deficiency anemia secondary to blood loss (chronic): Secondary | ICD-10-CM

## 2021-08-20 DIAGNOSIS — I10 Essential (primary) hypertension: Secondary | ICD-10-CM

## 2021-08-20 NOTE — Progress Notes (Signed)
Chief Complaint  Patient presents with   Care Coordination    Follow Up    Nicole Burns is a 46 y.o. year old female who was referred for medication management by their primary care provider, [name], MD. They presented for a phone visit in the context of the COVID-19 pandemic.    Subjective: Diabetes:  Current medications: Ozempic 2.83 + ~ 15 clicks  Medications tried in the past: none  Home weight: 236 lbs baseline weight: 257 lbs; total weight lost from baseline: 21 lbs  Reports she increased by a few clicks this past week, did have some increased constipation that was managed with hydration and dulcolax. Plans to increase to 0.5 mg weekly today with her dose.   Hypertension:  Current medications: amlodipine 10 mg daily, spironolactone 25 mg daily   Current blood pressure readings readings: has not been checking lately. Due to follow up with HTN clinic/Dr Oval Linsey  Denies chest pain, headaches, symptoms of hypertension  Hyperlipidemia/ASCVD Risk Reduction  Current lipid lowering medications: none Medications tried in the past:   Clinical ASCVD:  The 10-year ASCVD risk score (Arnett DK, et al., 2019) is: 9.4%   Values used to calculate the score:     Age: 32 years     Sex: Female     Is Non-Hispanic African American: Yes     Diabetic: Yes     Tobacco smoker: No     Systolic Blood Pressure: 662 mmHg     Is BP treated: Yes     HDL Cholesterol: 44.3 mg/dL     Total Cholesterol: 155 mg/dL   Anemia: - Taking Blood Builder supplement. Reports that she has follow up with GYN in a few weeks. Reviewed that she had to get her insurance sorted out before she could see them.   Objective:  Wt Readings from Last 3 Encounters:  08/20/21 236 lb (107 kg)  06/11/21 242 lb (109.8 kg)  05/20/21 242 lb 3.2 oz (109.9 kg)   Temp Readings from Last 3 Encounters:  05/20/21 (!) 96.5 F (35.8 C) (Temporal)  04/26/21 (!) 97.1 F (36.2 C) (Temporal)  02/12/21  (!) 95.7 F (35.4 C) (Temporal)   BP Readings from Last 3 Encounters:  06/11/21 136/82  05/20/21 128/76  04/26/21 134/72   Pulse Readings from Last 3 Encounters:  06/11/21 84  05/20/21 98  04/26/21 66    Lab Results  Component Value Date   HGBA1C 5.7 05/20/2021    Lab Results  Component Value Date   CREATININE 0.88 05/20/2021   BUN 11 05/20/2021   NA 134 (L) 05/20/2021   K 3.7 05/20/2021   CL 104 05/20/2021   CO2 24 05/20/2021    Lab Results  Component Value Date   CHOL 155 05/20/2021   HDL 44.30 05/20/2021   LDLCALC 100 (H) 05/20/2021   TRIG 55.0 05/20/2021   CHOLHDL 3 05/20/2021   Lab Results  Component Value Date   WBC 8.6 05/20/2021   HGB 8.8 Repeated and verified X2. (L) 05/20/2021   HCT 29.5 (L) 05/20/2021   MCV 70.3 (L) 05/20/2021   PLT 403.0 (H) 05/20/2021     Medications Reviewed Today     Reviewed by De Hollingshead, RPH-CPP (Pharmacist) on 08/20/21 at [place] List Status: <None>   Medication Order Taking? Sig Documenting Provider Last Dose Status Informant  amLODipine (NORVASC) 10 MG tablet [id number] Yes Take 1 tablet (10 mg total) by mouth daily. Oval Linsey,  Tiffany, MD Taking Active   furosemide (LASIX) 20 MG tablet [id number] Yes TAKE ONE TAB DAILY AS NEEDED FOR WEIGHT GAIN OR SWELLING Skeet Latch, MD Taking Active   glucose blood test strip [id number] No Use to check blood sugars twice daily  Patient not taking: Reported on 08/20/2021   [name], MD Not Taking Active   HOMEOPATHIC PRODUCTS PO [id number] Yes Take by mouth. Mega[employer] Blood Builder - vitamin C 15 mg, folic acid 443 mcg, Vitamin B12 30 mcg, Iron 26 mg [provider] Taking Active   Semaglutide,0.25 or 0.5MG /DOS, (OZEMPIC, 0.25 OR 0.5 MG/DOSE,) 2 MG/1.5ML SOPN [id number] Yes Inject 0.5 mg into the skin once a week. [name], MD Taking Active            Med Note ([place] Jun 18, 2021  8:33 AM) 1.95 mg + 10 clicks  spironolactone  (ALDACTONE) 25 MG tablet [id number] Yes Take 1 tablet (25 mg total) by mouth daily. Skeet Latch, MD Taking Active             Assessment/Plan:   Diabetes: - Currently controlled but with goals of weight loss  - Encouraged to increase Ozempic to 0.5 mg weekly as planned. Discussed increased risk of GI side effects with higher dose. Can decrease back to 0.5 mg + ~ 15 clicks if intolerable - Continue focus on diet and lifestyle modifications. - Follow up with PCP in May as scheduled   Hypertension: - Control currently unknown.  - Reviewed to schedule follow up with Dr. Blenda Mounts office - Continue current regimen at this time  Hyperlipidemia/ASCVD Risk Reduction: - Currently uncontrolled.  - Reviewed long term complications of uncontrolled cholesterol and recommendation for statin use in all patients with T2DM. Patient continues to elect to focus on lifestyle modifications.   Anemia: - Reiterated importance of follow up with GYN ASAP as per last lab notes from Dr. Derrel Nip   Follow Up Plan: video visit in ~ 6 weeks  Catie Darnelle Maffucci, PharmD, [place], CPP Clinical Pharmacist [employer] at [facility] [phone]

## 2021-08-20 NOTE — Telephone Encounter (Signed)
Pt returning call

## 2021-08-20 NOTE — Patient Instructions (Signed)
Chrysta,   Increase Ozempic to 0.5 mg weekly.   We recommend that all patients with diabetes start on a medication to reduce risks of heart attacks and strokes and prevent plaque build up in your arteries. We recommend consideration of a low dose, such as rosuvastatin 5 mg daily. Please think about this and discuss with Dr. Derrel Nip at your next visit.   Make sure you see your eye doctor yearly for a diabetic eye exam. Ask them to send those results to our office.   Please schedule follow up with Dr. Oval Linsey for blood pressure. Please follow up with your GYN as soon as possible for anemia.   Take care!  Catie Darnelle Maffucci, PharmD

## 2021-09-09 NOTE — Progress Notes (Signed)
Advanced Hypertension Clinic Follow-up:    Date:  09/10/2021   ID:  Nicole Burns, DOB 1975/08/19, MRN [id number]  PCP:  [name], MD  Cardiologist:  None  Nephrologist:  Referring MD: [name], MD   CC: Hypertension  History of Present Illness:    Nicole Burns is a 46 y.o. female with a hx of hypertension, diabetes, obesity, and migraine, here for follow-up. She was initially seen in the hypertension clinic 12/11/2020. She saw Dr. Garwin Brothers on 09/2020 and her blood pressure was 150/92. She first developed hypertension during pregnancy, and she did not tolerate HCTZ. She was started on spironolactone and encouraged to increase her exercise. She was referred to PREP, and given Lasix to use as needed. She followed up with our pharmacist and her BP was improved but above goal, so amlodipine was increased. Olmesartan was subsequently added, but she developed GI side effects and this was stopped. She was started on Ozempic and lost 10 lbs at her last visit.    Today, she has been feeling good. At home her blood pressure continues to be stable averaging 130/80. She does wait a few minutes at rest before taking a BP reading. Lately her LE edema has not been as severe. Generally when she exercises she will go out for a walk. She has not been formally exercising lately, but she continues to work on this. Her main limitation is a lack of motivation. On Ozempic, she has eliminated red meats as she is unable to tolerate them. Most of the time she is not feeling hungry, so her emotional eating has not been much of an issue. Additionally, she notes having more bilateral hip pain. She will follow-up with her chiropractor next week. She has never been a smoker. She denies any palpitations, chest pain, or shortness of breath. No lightheadedness, headaches, syncope, orthopnea, or PND.  Previous antihypertensives: Lisinopril-lip swelling, shortness of breath HCTZ-leg cramps,  fatigue Spironolactone- unsure why she stopped Olmesartan - GI side effects  Past Medical History:  Diagnosis Date   Abortion, missed 08/09/2015   Diabetes mellitus without complication (HCC)    Fatty liver    Gallbladder polyp    Hepatic cyst    History of chicken pox    Hypertension    Lower extremity edema 09/10/2021   Migraine    After menses cycle.    Pure hypercholesterolemia 6/83/4196   Umbilical hernia 2229    Past Surgical History:  Procedure Laterality Date   CESAREAN SECTION     1993 & 2003   COLONOSCOPY WITH PROPOFOL N/A 04/26/2021   Procedure: COLONOSCOPY WITH PROPOFOL;  Surgeon: Virgel Manifold, MD;  Location: [facility];  Service: Endoscopy;  Laterality: N/A;   DILATION AND EVACUATION N/A 08/01/2015   Procedure: DILATATION AND EVACUATION With Tissue Sent For Chromosomal Analysis;  Surgeon: Servando Salina, MD;  Location: [facility];  Service: Gynecology;  Laterality: N/A;   HERNIA REPAIR  2007   OPERATIVE ULTRASOUND N/A 08/01/2015   Procedure: OPERATIVE ULTRASOUND;  Surgeon: Servando Salina, MD;  Location: [facility];  Service: Gynecology;  Laterality: N/A;    Current Medications: Current Meds  Medication Sig   amLODipine (NORVASC) 10 MG tablet Take 1 tablet (10 mg total) by mouth daily.   furosemide (LASIX) 20 MG tablet TAKE ONE TAB DAILY AS NEEDED FOR WEIGHT GAIN OR SWELLING   glucose blood test strip Use to check blood sugars twice daily   HOMEOPATHIC PRODUCTS PO Take by mouth. MegaFood Blood Builder -  vitamin C 15 mg, folic acid 892 mcg, Vitamin B12 30 mcg, Iron 26 mg   Semaglutide,0.25 or 0.5MG /DOS, (OZEMPIC, 0.25 OR 0.5 MG/DOSE,) 2 MG/1.5ML SOPN Inject 0.5 mg into the skin once a week.   spironolactone (ALDACTONE) 25 MG tablet Take 1 tablet (25 mg total) by mouth daily.     Allergies:   Lisinopril, Maxzide [hydrochlorothiazide w-triamterene], and Vicodin [hydrocodone-acetaminophen]   Social History   Socioeconomic History   Marital status:  Married    Spouse name: Not on file   Number of children: Not on file   Years of education: Not on file   Highest education level: Not on file  Occupational History   Not on file  Tobacco Use   Smoking status: Never   Smokeless tobacco: Never  Vaping Use   Vaping Use: Never used  Substance and Sexual Activity   Alcohol use: No   Drug use: No   Sexual activity: Yes  Other Topics Concern   Not on file  Social History Narrative   Not on file   Social Determinants of Health   Financial Resource Strain: Low Risk    Difficulty of Paying Living Expenses: Not hard at all  Food Insecurity: No Food Insecurity   Worried About Charity fundraiser in the Last Year: Never true   [place] in the Last Year: Never true  Transportation Needs: No Transportation Needs   Lack of Transportation (Medical): No   Lack of Transportation (Non-Medical): No  Physical Activity: Inactive   Days of Exercise per Week: 0 days   Minutes of Exercise per Session: 0 min  Stress: Not on file  Social Connections: Not on file     Family History: The patient's family history includes Cancer in her maternal grandmother; Diabetes in her father and paternal grandmother; Heart attack in her father; Heart disease in her father; Hyperlipidemia in her father and maternal grandmother; Hypertension in her father, mother, and sister; Kidney disease in her paternal grandmother; Stroke in her father.  ROS:   Please see the history of present illness.    (+) Bilateral LE edema (+) Bilateral hip pain. All other systems reviewed and are negative.  EKGs/Labs/Other Studies Reviewed:    EKG:  EKG is personally reviewed. 09/10/2021: Sinus rhythm. Rate 77 bpm. 12/11/2020: NSR, rate 90 bpm, Nonspecific T wave abnormalities  Recent Labs: 05/20/2021: ALT 9; BUN 11; Creatinine, Ser 0.88; Hemoglobin 8.8 Repeated and verified X2.; Platelets 403.0; Potassium 3.7; Sodium 134   Recent Lipid Panel    Component Value  Date/Time   CHOL 155 05/20/2021 0948   TRIG 55.0 05/20/2021 0948   HDL 44.30 05/20/2021 0948   CHOLHDL 3 05/20/2021 0948   VLDL 11.0 05/20/2021 0948   LDLCALC 100 (H) 05/20/2021 0948    Physical Exam:    VS:  BP 132/80 (BP Location: Right Arm, Patient Position: Sitting, Cuff Size: Large)    Pulse 77    Ht 5\' 3"  (1.6 m)    Wt 238 lb 4.8 oz (108.1 kg)    BMI 42.21 kg/m  , BMI Body mass index is 42.21 kg/m. GENERAL:  Well appearing HEENT: Pupils equal round and reactive, fundi not visualized, oral mucosa unremarkable NECK:  No jugular venous distention, waveform within normal limits, carotid upstroke brisk and symmetric, no bruits, no thyromegaly LUNGS:  Clear to auscultation bilaterally HEART:  RRR.  PMI not displaced or sustained,S1 and S2 within normal limits, no S3, no S4, no clicks, no rubs,  no murmurs ABD:  Flat, positive bowel sounds normal in frequency in pitch, no bruits, no rebound, no guarding, no midline pulsatile mass, no hepatomegaly, no splenomegaly EXT:  2 plus pulses throughout, no edema, no cyanosis no clubbing SKIN:  No rashes no nodules NEURO:  Cranial nerves II through XII grossly intact, motor grossly intact throughout PSYCH:  Cognitively intact, oriented to person place and time   ASSESSMENT/PLAN:    Essential hypertension, benign Blood pressure has been around 130/80 at home.  It remains slightly above goal.  However she is doing good things with her weight loss.  I encouraged her to increase her exercise to at least 150 minutes weekly.  Continue amlodipine and spironolactone.  Obesity, morbid She is doing well with weight loss on Ozempic.  We discussed increasing exercise to 150 minutes.  Lower extremity edema Stable on furosemide.  Renal function and electrolytes are stable.  Pure hypercholesterolemia Cholesterol is borderline.  Her ASCVD 10-year risk is 8.4%.  We discussed coronary calcium score but she is uninterested at this time.  She wants to keep  working on diet and exercise.   Screening for Secondary Hypertension:  Causes 12/11/2020  Drugs/Herbals Screened     - Comments 2 Coke Zero, ibuprofen    Relevant Labs/Studies: Basic Labs Latest Ref Rng & Units 05/20/2021 04/24/2021 02/07/2021  Sodium 135 - 145 mEq/L 134(L) 141 135  Potassium 3.5 - 5.1 mEq/L 3.7 4.0 4.4  Creatinine 0.40 - 1.20 mg/dL 0.88 1.00 0.86    Thyroid  Latest Ref Rng & Units 08/18/2019 08/07/2015  TSH 0.35 - 4.50 uIU/mL 2.05 0.98    Disposition:    FU with Xayne Brumbaugh C. Oval Linsey, MD, [facility] in 6 months.   Medication Adjustments/Labs and Tests Ordered: Current medicines are reviewed at length with the patient today.  Concerns regarding medicines are outlined above.   Orders Placed This Encounter  Procedures   EKG 12-Lead   No orders of the defined types were placed in this encounter.  I,Mathew Stumpf,acting as a Education administrator for Skeet Latch, MD.,have documented all relevant documentation on the behalf of Skeet Latch, MD,as directed by  Skeet Latch, MD while in the presence of Skeet Latch, MD.  I, [place] Oval Linsey, MD have reviewed all documentation for this visit.  The documentation of the exam, diagnosis, procedures, and orders on 09/10/2021 are all accurate and complete.  Signed, Skeet Latch, MD  09/10/2021 9:23 AM    [place]

## 2021-09-10 ENCOUNTER — Encounter (HOSPITAL_BASED_OUTPATIENT_CLINIC_OR_DEPARTMENT_OTHER): Payer: Self-pay | Admitting: Cardiovascular Disease

## 2021-09-10 ENCOUNTER — Ambulatory Visit (INDEPENDENT_AMBULATORY_CARE_PROVIDER_SITE_OTHER): Payer: Managed Care, Other (non HMO) | Admitting: Cardiovascular Disease

## 2021-09-10 ENCOUNTER — Other Ambulatory Visit: Payer: Self-pay

## 2021-09-10 DIAGNOSIS — E7800 Pure hypercholesterolemia, unspecified: Secondary | ICD-10-CM | POA: Diagnosis not present

## 2021-09-10 DIAGNOSIS — R600 Localized edema: Secondary | ICD-10-CM

## 2021-09-10 DIAGNOSIS — I10 Essential (primary) hypertension: Secondary | ICD-10-CM

## 2021-09-10 HISTORY — DX: Localized edema: R60.0

## 2021-09-10 HISTORY — DX: Pure hypercholesterolemia, unspecified: E78.00

## 2021-09-10 NOTE — Assessment & Plan Note (Signed)
Cholesterol is borderline.  Her ASCVD 10-year risk is 8.4%.  We discussed coronary calcium score but she is uninterested at this time.  She wants to keep working on diet and exercise.

## 2021-09-10 NOTE — Assessment & Plan Note (Signed)
Blood pressure has been around 130/80 at home.  It remains slightly above goal.  However she is doing good things with her weight loss.  I encouraged her to increase her exercise to at least 150 minutes weekly.  Continue amlodipine and spironolactone.

## 2021-09-10 NOTE — Assessment & Plan Note (Signed)
She is doing well with weight loss on Ozempic.  We discussed increasing exercise to 150 minutes.

## 2021-09-10 NOTE — Assessment & Plan Note (Signed)
Stable on furosemide.  Renal function and electrolytes are stable.

## 2021-09-10 NOTE — Patient Instructions (Signed)
Medication Instructions:  Your physician recommends that you continue on your current medications as directed. Please refer to the Current Medication list given to you today.   Labwork: NONE  Testing/Procedures: NONE  Follow-Up: Your physician wants you to follow-up in: 6 MONTHS  You will receive a reminder letter in the mail two months in advance. If you don't receive a letter, please call our office to schedule the follow-up appointment.  If you need a refill on your cardiac medications before your next appointment, please call your pharmacy.

## 2021-09-16 ENCOUNTER — Other Ambulatory Visit: Payer: Self-pay | Admitting: Cardiovascular Disease

## 2021-09-16 ENCOUNTER — Telehealth: Payer: Self-pay

## 2021-09-16 NOTE — Telephone Encounter (Signed)
Rx(s) sent to pharmacy electronically.  

## 2021-09-16 NOTE — Chronic Care Management (AMB) (Signed)
°  Care Management   Note  09/16/2021 Name: [name] MRN: [id number] DOB: Apr 17, 1976  Nicole Burns is a 46 y.o. year old female who is a primary care patient of Derrel Nip, Aris Everts, MD and is actively engaged with the care management team. I reached out to [name] by phone today to assist with re-scheduling a follow up visit with the Pharmacist  Follow up plan: Unsuccessful telephone outreach attempt made. A HIPAA compliant phone message was left for the patient providing contact information and requesting a return call.  The care management team will reach out to the patient again over the next 5 days.  If patient returns call to provider office, please advise to call [facility]  [facility] [place], [place] [address], [place], [place] [address] Direct Dial: [phone] [email or web address]

## 2021-09-23 NOTE — Chronic Care Management (AMB) (Signed)
?  Care Management  ? ?Note ? ?09/23/2021 ?Name: Yanique Mulvihill MRN: [id number] DOB: April 06, 1976 ? ?Geneieve Duell is a 46 y.o. year old female who is a primary care patient of Derrel Nip, Aris Everts, MD and is actively engaged with the care management team. I reached out to [name] by phone today to assist with re-scheduling a follow up visit with the Pharmacist ? ?Follow up plan: ?Telephone appointment with care management team member scheduled for:09/26/2021 ? ?Noreene Larsson, RMA ?Care Guide, Embedded Care Coordination ?[facility]  ?[place], [place] [address] ?Direct Dial: [phone] ?[email or web address] ?Website: [email or web address]  ? ?

## 2021-09-26 ENCOUNTER — Telehealth: Payer: Managed Care, Other (non HMO) | Admitting: Pharmacist

## 2021-09-26 DIAGNOSIS — D500 Iron deficiency anemia secondary to blood loss (chronic): Secondary | ICD-10-CM

## 2021-09-26 DIAGNOSIS — E1169 Type 2 diabetes mellitus with other specified complication: Secondary | ICD-10-CM

## 2021-09-26 DIAGNOSIS — I10 Essential (primary) hypertension: Secondary | ICD-10-CM

## 2021-09-26 NOTE — Addendum Note (Signed)
Addended by: Adair Laundry on: 09/26/2021 02:16 PM ? ? Modules accepted: Orders ? ?

## 2021-09-26 NOTE — Progress Notes (Signed)
? ?   ? ?Chief Complaint  ?Patient presents with  ? Diabetes  ? ? ?Nicole Burns is a 46 y.o. year old female who was referred for medication management by their primary care provider, [name], MD. They presented for a virtual visit in the context of the COVID-19 pandemic.  ?  ? ?Subjective: ?Diabetes: ? ?Current medications:  Ozempic 0.5 mg weekly - reports she is tolerating this medication relatively well. Occasional GI upset with certain foods.  ? ? ?Current meal patterns: working on finding a meal plan that works for her consistently ?- Breakfast: generally skipping; sometimes coffee, nabs ?- Lunch: not eating a lot, sometimes chicken salad, nabs;  ?- Supper: avoiding high fat foods, minimizing red meat ? ?Current physical activity: getting ready to start walking more often in the mornings before work.  ? ?Hypertension: ? ?Current medications: amlodipine 10 mg daily,  ? ?Current blood pressure readings readings: reports home readings ~ 130s/80s. Recent follow up with Dr. Oval Linsey.  ? ?Hyperlipidemia/ASCVD Risk Reduction ? ?Current lipid lowering medications: none ? ?Clinical ASCVD: No  ?The 10-year ASCVD risk score (Arnett DK, et al., 2019) is: 8.3% ?  Values used to calculate the score: ?    Age: 86 years ?    Sex: Female ?    Is Non-Hispanic African American: Yes ?    Diabetic: Yes ?    Tobacco smoker: No ?    Systolic Blood Pressure: 627 mmHg ?    Is BP treated: Yes ?    HDL Cholesterol: 44.3 mg/dL ?    Total Cholesterol: 155 mg/dL  ? ? ? ? ? ?Objective: ?Lab Results  ?Component Value Date  ? HGBA1C 5.7 05/20/2021  ? ? ?Lab Results  ?Component Value Date  ? CREATININE 0.88 05/20/2021  ? BUN 11 05/20/2021  ? NA 134 (L) 05/20/2021  ? K 3.7 05/20/2021  ? CL 104 05/20/2021  ? CO2 24 05/20/2021  ? ? ?Lab Results  ?Component Value Date  ? CHOL 155 05/20/2021  ? HDL 44.30 05/20/2021  ? [place] 100 (H) 05/20/2021  ? TRIG 55.0 05/20/2021  ? CHOLHDL 3 05/20/2021  ? ? ?Medications Reviewed Today   ? ?  Reviewed by Skeet Latch, MD (Physician) on 09/10/21 at [phone]  Med List Status: <None>  ? ?Medication Order Taking? Sig Documenting Provider Last Dose Status Informant  ?amLODipine (NORVASC) 10 MG tablet [id number] Yes Take 1 tablet (10 mg total) by mouth daily. Skeet Latch, MD Taking Active   ?furosemide (LASIX) 20 MG tablet [id number] Yes TAKE ONE TAB DAILY AS NEEDED FOR WEIGHT GAIN OR SWELLING Skeet Latch, MD Taking Active   ?glucose blood test strip [id number] Yes Use to check blood sugars twice daily [name], MD Taking Active   ?HOMEOPATHIC PRODUCTS PO [id number] Yes Take by mouth. MegaFood Blood Builder - vitamin C 15 mg, folic acid 423 mcg, Vitamin B12 30 mcg, Iron 26 mg [provider] Taking Active   ?Semaglutide,0.25 or 0.'5MG'$ /DOS, (OZEMPIC, 0.25 OR 0.5 MG/DOSE,) 2 MG/1.5ML SOPN [id number] Yes Inject 0.5 mg into the skin once a week. [name], MD Taking Active   ?         ?Med Note De [place]   Tue Jun 18, 2021  8:33 AM) 4.00 mg + 10 clicks  ?spironolactone (ALDACTONE) 25 MG tablet [id number] Yes Take 1 tablet (25 mg total) by mouth daily. Skeet Latch, MD Taking Active   ? ?  ?  ? ?  ? ? ?  Assessment/Plan:  ? ?Diabetes: ?- Currently controlled but with desire for weight loss ?- Reviewed dietary modifications including focus on lean proteins, fruits and vegetables, whole grains, hydration. Discussed incorporation of low sugar proteins shakes.  ?- Reviewed lifestyle modifications including increase in physical activity as scheduled. ?- Recommend to continue current regimen at this time. Follow up with PCP in May to consider dose increase  ? ?Hypertension: ?- Currently moderately well controlled ?- Recommend to continue current regimen and continue focus on lifestyle modifications/weight loss as above. Continue follow up with Dr. Oval Linsey ? ?Hyperlipidemia/ASCVD Risk Reduction: ?- Recommend to consider initiation of moderate intensity statin moving forward.   ? ? ?Follow Up Plan: fasting labs and PCP visit in 2 months ? ?Catie Darnelle Maffucci, PharmD, BCACP, CPP ?Clinical Pharmacist ?Therapist, music at [employer] ?[phone] ? ? ? ?

## 2021-09-26 NOTE — Patient Instructions (Addendum)
Nada,  ? ?Keep up the great work! ? ?Continue Ozempic 0.5 mg weekly. Focus on lean proteins, vegetables and fruits, whole grains, and adequate hydration with water. I love your plan to increase walking in the mornings.  ? ?Let us know if you need anything! ? ?Catie Darnelle Maffucci, PharmD ?

## 2021-10-07 ENCOUNTER — Telehealth: Payer: Managed Care, Other (non HMO)

## 2021-10-10 ENCOUNTER — Telehealth: Payer: Managed Care, Other (non HMO)

## 2021-10-20 ENCOUNTER — Other Ambulatory Visit: Payer: Self-pay | Admitting: Internal Medicine

## 2021-10-20 DIAGNOSIS — E1169 Type 2 diabetes mellitus with other specified complication: Secondary | ICD-10-CM

## 2021-11-15 ENCOUNTER — Telehealth: Payer: Self-pay | Admitting: Internal Medicine

## 2021-11-15 ENCOUNTER — Other Ambulatory Visit (INDEPENDENT_AMBULATORY_CARE_PROVIDER_SITE_OTHER): Payer: Managed Care, Other (non HMO)

## 2021-11-15 DIAGNOSIS — E669 Obesity, unspecified: Secondary | ICD-10-CM | POA: Diagnosis not present

## 2021-11-15 DIAGNOSIS — E1169 Type 2 diabetes mellitus with other specified complication: Secondary | ICD-10-CM | POA: Diagnosis not present

## 2021-11-15 DIAGNOSIS — I10 Essential (primary) hypertension: Secondary | ICD-10-CM

## 2021-11-15 DIAGNOSIS — D500 Iron deficiency anemia secondary to blood loss (chronic): Secondary | ICD-10-CM | POA: Diagnosis not present

## 2021-11-15 LAB — LIPID PANEL
Cholesterol: 137 mg/dL (ref 0–200)
HDL: 43.4 mg/dL (ref >39.00)
LDL Cholesterol: 83 mg/dL (ref 0–99)
NonHDL: 93.48
Total CHOL/HDL Ratio: 3
Triglycerides: 53 mg/dL (ref 0.0–149.0)
VLDL: 10.6 mg/dL (ref 0.0–40.0)

## 2021-11-15 LAB — CBC WITH DIFFERENTIAL/PLATELET
Basophils Absolute: 0.1 10*3/uL (ref 0.0–0.1)
Basophils Relative: 1.3 % (ref 0.0–3.0)
Eosinophils Absolute: 0.2 10*3/uL (ref 0.0–0.7)
Eosinophils Relative: 3.3 % (ref 0.0–5.0)
HCT: 26.5 % — ABNORMAL LOW (ref 36.0–46.0)
Hemoglobin: 7.9 g/dL — CL (ref 12.0–15.0)
Lymphocytes Relative: 32 % (ref 12.0–46.0)
Lymphs Abs: 1.7 10*3/uL (ref 0.7–4.0)
MCHC: 29.8 g/dL — ABNORMAL LOW (ref 30.0–36.0)
MCV: 70.8 fl — ABNORMAL LOW (ref 78.0–100.0)
Monocytes Absolute: 0.7 10*3/uL (ref 0.1–1.0)
Monocytes Relative: 12.2 % — ABNORMAL HIGH (ref 3.0–12.0)
Neutro Abs: 2.8 10*3/uL (ref 1.4–7.7)
Neutrophils Relative %: 51.2 % (ref 43.0–77.0)
Platelets: 374 10*3/uL (ref 150.0–400.0)
RBC: 3.75 Mil/uL — ABNORMAL LOW (ref 3.87–5.11)
RDW: 18.2 % — ABNORMAL HIGH (ref 11.5–15.5)
WBC: 5.4 10*3/uL (ref 4.0–10.5)

## 2021-11-15 LAB — COMPREHENSIVE METABOLIC PANEL
ALT: 10 U/L (ref 0–35)
AST: 12 U/L (ref 0–37)
Albumin: 3.8 g/dL (ref 3.5–5.2)
Alkaline Phosphatase: 55 U/L (ref 39–117)
BUN: 12 mg/dL (ref 6–23)
CO2: 25 mEq/L (ref 19–32)
Calcium: 9.3 mg/dL (ref 8.4–10.5)
Chloride: 106 mEq/L (ref 96–112)
Creatinine, Ser: 0.9 mg/dL (ref 0.40–1.20)
GFR: 75.82 mL/min (ref >60.00)
Glucose, Bld: 88 mg/dL (ref 70–99)
Potassium: 3.8 mEq/L (ref 3.5–5.1)
Sodium: 136 mEq/L (ref 135–145)
Total Bilirubin: 0.4 mg/dL (ref 0.2–1.2)
Total Protein: 7.1 g/dL (ref 6.0–8.3)

## 2021-11-15 LAB — HEMOGLOBIN A1C: Hgb A1c MFr Bld: 5.4 % (ref 4.6–6.5)

## 2021-11-15 NOTE — Telephone Encounter (Signed)
LMTCB

## 2021-11-15 NOTE — Telephone Encounter (Signed)
Patient's hgb has dropped from 8.8 in October to 7.9.  I have warned her before that her heavy periods are causing this.  ? ?If she is short of breath or feeling dizzy, she needs to be taken to the ER to get a blood transfusion ? ? ?If she feels fine, she needs to resume iron and MAKE AN APT WITH HER GYNECOLOGIST ASAP  ? ?If she is already taking iron,  she needs to accept a referral to hematology for iron infusion  ? ? ?

## 2021-11-15 NOTE — Telephone Encounter (Signed)
CRITICAL VALUE STICKER ? ?CRITICAL VALUE: Hemoglobin 7.9 ? ?RECEIVER (on-site recipient of call): Sharee Pimple  ? ?DATE & TIME NOTIFIED: 11/15/2021 2:07pm ? ?MESSENGER (representative from lab): Holly ? ?MD NOTIFIED: Dr. Derrel Nip ? ?TIME OF NOTIFICATION: 2:09pm ? ?RESPONSE:   ?

## 2021-11-15 NOTE — Telephone Encounter (Signed)
Spoke with pt and she stated that she is currently on her menstrual cycle. Pt is asymptomatic. Has an appt with Dr. Garwin Brothers on 11/28/2021. Pt is currently taking an iron supplement but forgot to take today due to being fasting for blood work.  ?

## 2021-11-15 NOTE — Telephone Encounter (Signed)
Mychart message has been sent as well. ?

## 2021-11-16 LAB — IRON,TIBC AND FERRITIN PANEL
%SAT: 4 % (calc) — ABNORMAL LOW (ref 16–45)
Ferritin: 4 ng/mL — ABNORMAL LOW (ref 16–232)
Iron: 17 ug/dL — ABNORMAL LOW (ref 40–190)
TIBC: 398 mcg/dL (calc) (ref 250–450)

## 2021-11-17 NOTE — Addendum Note (Signed)
Addended by: [name] on: 11/17/2021 01:57 PM ? ? Modules accepted: Orders ? ?

## 2021-11-18 ENCOUNTER — Encounter: Payer: Self-pay | Admitting: Internal Medicine

## 2021-11-18 ENCOUNTER — Encounter: Payer: Self-pay | Admitting: *Deleted

## 2021-11-18 ENCOUNTER — Ambulatory Visit (INDEPENDENT_AMBULATORY_CARE_PROVIDER_SITE_OTHER): Payer: Managed Care, Other (non HMO) | Admitting: Internal Medicine

## 2021-11-18 DIAGNOSIS — E669 Obesity, unspecified: Secondary | ICD-10-CM

## 2021-11-18 DIAGNOSIS — D500 Iron deficiency anemia secondary to blood loss (chronic): Secondary | ICD-10-CM | POA: Diagnosis not present

## 2021-11-18 DIAGNOSIS — N921 Excessive and frequent menstruation with irregular cycle: Secondary | ICD-10-CM

## 2021-11-18 DIAGNOSIS — I10 Essential (primary) hypertension: Secondary | ICD-10-CM

## 2021-11-18 DIAGNOSIS — E1169 Type 2 diabetes mellitus with other specified complication: Secondary | ICD-10-CM | POA: Diagnosis not present

## 2021-11-18 MED ORDER — ONDANSETRON HCL 4 MG PO TABS: 4.0000 mg | ORAL_TABLET | Freq: Three times a day (TID) | ORAL | 1 refills | Status: DC | PRN

## 2021-11-18 NOTE — Progress Notes (Signed)
? ?Subjective:  ?Patient ID: Nicole Burns, female    DOB: Dec 07, 1975  Age: 46 y.o. MRN: [id number] ? ?CC: Diagnoses of Diabetes mellitus type 2 in obese ([place]), Menorrhagia with irregular cycle, Essential hypertension, benign, and Iron deficiency anemia due to chronic blood loss were pertinent to this visit. ? ? ?This visit occurred during the SARS-CoV-2 public health emergency.  Safety protocols were in place, including screening questions prior to the visit, additional usage of staff PPE, and extensive cleaning of exam room while observing appropriate contact time as indicated for disinfecting solutions.   ? ?HPI ?Nicole Burns presents for  ?Chief Complaint  ?Patient presents with  ? Follow-up  ?  6 month follow up on diabetes  ? ?Type 2 DM with obesity:  she has been using 0.5 mg weekly dose of OZEMPIC .   USING A PRE/PROBIOTIC GUMMY THAT HAS HELPED WITH THE gi SIDE EFFECTS.  DIET IS LIMITED TO FISH, CHICKEN , VEGETABLES AND YOGURT.  SOME [place] AND SHRIMP.  Cannot tolerate leftovers due to nausea.  Tries to avoid comparing herself with other Ozempic users who are losing weight more rapidly.  NOT EXERCISING yet.  She  is still grieving the loss of her father and feels that her self neglect since the death of her father in October 14, 2017 has finally been reconciled and she is motivated to start putting her needs first. ? ?2) Iron deficient anemia:  chronic,  secondary to fibroids causing menorrhagia.  Iron intolerant.  Agreeable to iron infusion, which she has had once before.  Seeing GYN next week to discuss her options (non surgical) for management  ? ?3) HTN:  Patient is taking her medications as prescribed and notes no adverse effects.  Home BP readings have been done about once per week and are  generally < 130/80 .  She is avoiding added salt in her diet and walking regularly about 3 times per week for exercise  .  ? ? ?Outpatient Medications Prior to Visit  ?Medication Sig Dispense Refill  ? amLODipine  (NORVASC) 10 MG tablet TAKE 1 TABLET BY MOUTH EVERY DAY 90 tablet 3  ? glucose blood test strip Use to check blood sugars twice daily 200 each 3  ? HOMEOPATHIC PRODUCTS PO Take by mouth. MegaFood Blood Builder - vitamin C 15 mg, folic acid 376 mcg, Vitamin B12 30 mcg, Iron 26 mg    ? OZEMPIC, 0.25 OR 0.5 MG/DOSE, 2 MG/3ML SOPN Inject 0.5 mg into the skin once a week.    ? spironolactone (ALDACTONE) 25 MG tablet Take 1 tablet (25 mg total) by mouth daily. 90 tablet 3  ? furosemide (LASIX) 20 MG tablet TAKE ONE TAB DAILY AS NEEDED FOR WEIGHT GAIN OR SWELLING (Patient not taking: Reported on 09/26/2021) 30 tablet 6  ? OZEMPIC, 0.25 OR 0.5 MG/DOSE, 2 MG/1.5ML SOPN INJECT 0.5 MG INTO THE SKIN ONCE A WEEK. (Patient not taking: Reported on 11/18/2021) 1.5 mL 2  ? ?No facility-administered medications prior to visit.  ? ? ?Review of Systems; ? ?Patient denies headache, fevers, malaise, unintentional weight loss, skin rash, eye pain, sinus congestion and sinus pain, sore throat, dysphagia,  hemoptysis , cough, dyspnea, wheezing, chest pain, palpitations, orthopnea, edema, abdominal pain, nausea, melena, diarrhea, constipation, flank pain, dysuria, hematuria, urinary  Frequency, nocturia, numbness, tingling, seizures,  Focal weakness, Loss of consciousness,  Tremor, insomnia, depression, anxiety, and suicidal ideation.   ? ? ? ?Objective:  ?BP 112/78 (BP Location: Left Arm, Patient Position: Sitting, Cuff Size:  Large)   Pulse 83   Temp 98.3 ?F (36.8 ?C) (Oral)   Ht '5\' 3"'$  (1.6 m)   Wt 235 lb 12.8 oz (107 kg)   SpO2 99%   BMI 41.77 kg/m?  ? ?BP Readings from Last 3 Encounters:  ?11/18/21 112/78  ?09/10/21 132/80  ?06/11/21 136/82  ? ? ?Wt Readings from Last 3 Encounters:  ?11/18/21 235 lb 12.8 oz (107 kg)  ?09/10/21 238 lb 4.8 oz (108.1 kg)  ?08/20/21 236 lb (107 kg)  ? ? ?General appearance: alert, cooperative and appears stated age ?Ears: normal TM's and external ear canals both ears ?Throat: lips, mucosa, and tongue  normal; teeth and gums normal ?Neck: no adenopathy, no carotid bruit, supple, symmetrical, trachea midline and thyroid not enlarged, symmetric, no tenderness/mass/nodules ?Back: symmetric, no curvature. ROM normal. No CVA tenderness. ?Lungs: clear to auscultation bilaterally ?Heart: regular rate and rhythm, S1, S2 normal, no murmur, click, rub or gallop ?Abdomen: soft, non-tender; bowel sounds normal; no masses,  no organomegaly ?Pulses: 2+ and symmetric ?Skin: Skin color, texture, turgor normal. No rashes or lesions ?Lymph nodes: Cervical, supraclavicular, and axillary nodes normal. ? ?Lab Results  ?Component Value Date  ? HGBA1C 5.4 11/15/2021  ? HGBA1C 5.7 05/20/2021  ? HGBA1C 7.0 (H) 02/07/2021  ? ? ?Lab Results  ?Component Value Date  ? CREATININE 0.90 11/15/2021  ? CREATININE 0.88 05/20/2021  ? CREATININE 1.00 04/24/2021  ? ? ?Lab Results  ?Component Value Date  ? WBC 5.4 11/15/2021  ? HGB 7.9 Repeated and verified X2. (LL) 11/15/2021  ? HCT 26.5 (L) 11/15/2021  ? PLT 374.0 11/15/2021  ? GLUCOSE 88 11/15/2021  ? CHOL 137 11/15/2021  ? TRIG 53.0 11/15/2021  ? HDL 43.40 11/15/2021  ? [place] 83 11/15/2021  ? ALT 10 11/15/2021  ? AST 12 11/15/2021  ? NA 136 11/15/2021  ? K 3.8 11/15/2021  ? CL 106 11/15/2021  ? CREATININE 0.90 11/15/2021  ? BUN 12 11/15/2021  ? CO2 25 11/15/2021  ? TSH 2.05 08/18/2019  ? HGBA1C 5.4 11/15/2021  ? MICROALBUR 1.0 05/20/2021  ? ? ?No results found. ? ?Assessment & Plan:  ? ?Problem List Items Addressed This Visit   ? ? Menorrhagia  ?  Secondary to large fibroids  . She sees  Dr Garwin Brothers next week for discussion of procedures to stop the fibroids  ? ?  ?  ? Iron deficiency anemia due to chronic blood loss  ?  Asymptomatic,  Worsening.  Referring for iv iron infusion .  Secondary to menorrhagia  ? ?  ?  ? Essential hypertension, benign  ?  Did not tolerate olmesartan due to nausea and diarrhea. Had muscle cramps with hctz.    BP improved with weight loss . Continue spironlactone and   Amlodipine.   ? ?Lab Results  ?Component Value Date  ? CREATININE 0.90 11/15/2021  ? ?Lab Results  ?Component Value Date  ? NA 136 11/15/2021  ? K 3.8 11/15/2021  ? CL 106 11/15/2021  ? CO2 25 11/15/2021  ? ?Lab Results  ?Component Value Date  ? MICROALBUR 1.0 05/20/2021  ? MICROALBUR 6.6 (H) 08/18/2019  ? ? ? ?  ?  ? Diabetes mellitus type 2 in obese [facility])  ?  With fatty liver, obesity  and hypertension .  Improving BMI with Ozempic.  encouraged to start exercising  ? ?  ?  ? Relevant Medications  ? OZEMPIC, 0.25 OR 0.5 MG/DOSE, 2 MG/3ML SOPN  ? ? ?I spent  a total of 43 minutes with this patient in a face to face visit on the date of this encounter reviewing  her reaction to her father's death in October 10, 2017 and the subsequent effects on her health and motivation,  her current use of ozempic , the side effects she has been having, and how she is managing the change in her appetite,  her menstrual history and most recent visit with her gyncologist,  Dr Garwin Brothers, her most recent labs,  her intolerance to  oral  , and post visit ordering of testing and therapeutics.   ? ?Follow-up: No follow-ups on file. ? ? ?[name], MD ? ?

## 2021-11-18 NOTE — Assessment & Plan Note (Signed)
Asymptomatic,  Worsening.  Referring for iv iron infusion .  Secondary to menorrhagia  ?

## 2021-11-18 NOTE — Assessment & Plan Note (Signed)
With fatty liver, obesity  and hypertension .  Improving BMI with Ozempic.  encouraged to start exercising  ?

## 2021-11-18 NOTE — Patient Instructions (Signed)
Referral for Iron infusion is in process ? ? ?Zofran as needed for nausea ? ? ?See you in 3 months  ?

## 2021-11-18 NOTE — Assessment & Plan Note (Signed)
Did not tolerate olmesartan due to nausea and diarrhea. Had muscle cramps with hctz.    BP improved with weight loss . Continue spironlactone and  Amlodipine.    Lab Results  Component Value Date   CREATININE 0.90 11/15/2021   Lab Results  Component Value Date   NA 136 11/15/2021   K 3.8 11/15/2021   CL 106 11/15/2021   CO2 25 11/15/2021   Lab Results  Component Value Date   MICROALBUR 1.0 05/20/2021   MICROALBUR 6.6 (H) 08/18/2019     

## 2021-11-18 NOTE — Assessment & Plan Note (Signed)
Secondary to large fibroids  . She sees  Dr Garwin Brothers next week for discussion of procedures to stop the fibroids  ?

## 2021-11-27 ENCOUNTER — Telehealth: Payer: Self-pay

## 2021-11-27 NOTE — Telephone Encounter (Signed)
Called NP to go over chart for her appt with Dr. Tasia Catchings. Patient didn't answer. Left message. ?

## 2021-11-28 ENCOUNTER — Inpatient Hospital Stay: Payer: Managed Care, Other (non HMO) | Admitting: Oncology

## 2021-11-28 ENCOUNTER — Inpatient Hospital Stay: Payer: Managed Care, Other (non HMO)

## 2021-11-28 ENCOUNTER — Telehealth: Payer: Self-pay | Admitting: Oncology

## 2021-11-28 NOTE — Telephone Encounter (Signed)
pt has appt today at another location sent msg in to r/s appt.Nicole Burns  ?

## 2021-12-05 ENCOUNTER — Inpatient Hospital Stay: Payer: Managed Care, Other (non HMO) | Attending: Oncology | Admitting: Oncology

## 2021-12-05 ENCOUNTER — Inpatient Hospital Stay: Payer: Managed Care, Other (non HMO)

## 2021-12-05 ENCOUNTER — Encounter: Payer: Self-pay | Admitting: Oncology

## 2021-12-05 VITALS — BP 135/82 | HR 70 | Temp 97.6°F | Resp 18 | Ht 63.0 in | Wt 239.1 lb

## 2021-12-05 DIAGNOSIS — Z803 Family history of malignant neoplasm of breast: Secondary | ICD-10-CM | POA: Insufficient documentation

## 2021-12-05 DIAGNOSIS — E119 Type 2 diabetes mellitus without complications: Secondary | ICD-10-CM | POA: Diagnosis not present

## 2021-12-05 DIAGNOSIS — D500 Iron deficiency anemia secondary to blood loss (chronic): Secondary | ICD-10-CM | POA: Diagnosis present

## 2021-12-05 DIAGNOSIS — N921 Excessive and frequent menstruation with irregular cycle: Secondary | ICD-10-CM | POA: Diagnosis not present

## 2021-12-05 DIAGNOSIS — I10 Essential (primary) hypertension: Secondary | ICD-10-CM | POA: Insufficient documentation

## 2021-12-05 NOTE — Progress Notes (Signed)
Pt here to establish care for IDA.

## 2021-12-05 NOTE — Progress Notes (Signed)
Hematology/Oncology Consult note [phone] [phone]         Patient Care Team: [name], MD as PCP - General (Internal Medicine) Christene Lye, MD (General Surgery)  REFERRING PROVIDER: [name], MD  CHIEF COMPLAINTS/REASON FOR VISIT:  Evaluation of iron deficiency anemia  HISTORY OF PRESENTING ILLNESS:   Nicole Burns is a  46 y.o.  female with PMH listed below was seen in consultation at the request of  [name], MD  for evaluation of iron deficiency anemia  11/15/2021, patient had a CBC done which showed a hemoglobin of 7.9, MCV 70.8.  Normal white count and platelet count. Iron panel showed iron saturation of 4, ferritin of 4. Reviewed patient's previous lab records, patient has chronic anemia, dated back at least to some protein. She has heavy menstrual period patient follows up with gynecology and has a pelvic ultrasound scheduled for further work-up Patient reports feeling tired sometimes.  Denies any black or bloody stool.  Colonoscopy is up-to-date.  Review of Systems  Constitutional:  Positive for fatigue. Negative for appetite change, chills and fever.  HENT:   Negative for hearing loss and voice change.   Eyes:  Negative for eye problems.  Respiratory:  Negative for chest tightness and cough.   Cardiovascular:  Negative for chest pain.  Gastrointestinal:  Negative for abdominal distention, abdominal pain and blood in stool.  Endocrine: Negative for hot flashes.  Genitourinary:  Positive for menstrual problem. Negative for difficulty urinating and frequency.   Musculoskeletal:  Negative for arthralgias.  Skin:  Negative for itching and rash.  Neurological:  Negative for extremity weakness.  Hematological:  Negative for adenopathy.  Psychiatric/Behavioral:  Negative for confusion.    MEDICAL HISTORY:  Past Medical History:  Diagnosis Date   Abortion, missed 08/09/2015   Diabetes mellitus without  complication ([place])    Fatty liver    Gallbladder polyp    Hepatic cyst    History of chicken pox    Hypertension    Lower extremity edema 09/10/2021   Migraine    After menses cycle.    Pure hypercholesterolemia 08/14/5807   Umbilical hernia 9833    SURGICAL HISTORY: Past Surgical History:  Procedure Laterality Date   CESAREAN SECTION     1993 & 2003   COLONOSCOPY WITH PROPOFOL N/A 04/26/2021   Procedure: COLONOSCOPY WITH PROPOFOL;  Surgeon: Virgel Manifold, MD;  Location: [facility];  Service: Endoscopy;  Laterality: N/A;   DILATION AND EVACUATION N/A 08/01/2015   Procedure: DILATATION AND EVACUATION With Tissue Sent For Chromosomal Analysis;  Surgeon: Servando Salina, MD;  Location: [facility];  Service: Gynecology;  Laterality: N/A;   HERNIA REPAIR  2007   OPERATIVE ULTRASOUND N/A 08/01/2015   Procedure: OPERATIVE ULTRASOUND;  Surgeon: Servando Salina, MD;  Location: [facility];  Service: Gynecology;  Laterality: N/A;    SOCIAL HISTORY: Social History   Socioeconomic History   Marital status: Married    Spouse name: Not on file   Number of children: Not on file   Years of education: Not on file   Highest education level: Not on file  Occupational History   Not on file  Tobacco Use   Smoking status: Never   Smokeless tobacco: Never  Vaping Use   Vaping Use: Never used  Substance and Sexual Activity   Alcohol use: No   Drug use: No   Sexual activity: Yes  Other Topics Concern   Not on file  Social History  Narrative   Not on file   Social Determinants of Health   Financial Resource Strain: Low Risk    Difficulty of Paying Living Expenses: Not hard at all  Food Insecurity: No Food Insecurity   Worried About Charity fundraiser in the Last Year: Never true   [place] in the Last Year: Never true  Transportation Needs: No Transportation Needs   Lack of Transportation (Medical): No   Lack of Transportation (Non-Medical): No  Physical Activity:  Inactive   Days of Exercise per Week: 0 days   Minutes of Exercise per Session: 0 min  Stress: Not on file  Social Connections: Not on file  Intimate Partner Violence: Not on file    FAMILY HISTORY: Family History  Problem Relation Age of Onset   Hypertension Mother    Hyperlipidemia Father    Heart disease Father    Stroke Father    Diabetes Father    Hypertension Father    Hypertension Sister    Hyperlipidemia Maternal Grandmother    Cancer Maternal Grandmother        Breast   Breast cancer Maternal Grandmother    Kidney disease Paternal Grandmother    Diabetes Paternal Grandmother     ALLERGIES:  is allergic to lisinopril, maxzide [hydrochlorothiazide w-triamterene], and vicodin [hydrocodone-acetaminophen].  MEDICATIONS:  Current Outpatient Medications  Medication Sig Dispense Refill   amLODipine (NORVASC) 10 MG tablet TAKE 1 TABLET BY MOUTH EVERY DAY 90 tablet 3   HOMEOPATHIC PRODUCTS PO Take by mouth. [employer] Blood Builder - vitamin C 15 mg, folic acid 025 mcg, Vitamin B12 30 mcg, Iron 26 mg     ondansetron (ZOFRAN) 4 MG tablet Take 1 tablet (4 mg total) by mouth every 8 (eight) hours as needed for nausea or vomiting. 30 tablet 1   OZEMPIC, 0.25 OR 0.5 MG/DOSE, 2 MG/3ML SOPN Inject 0.5 mg into the skin once a week.     spironolactone (ALDACTONE) 25 MG tablet Take 1 tablet (25 mg total) by mouth daily. 90 tablet 3   furosemide (LASIX) 20 MG tablet TAKE ONE TAB DAILY AS NEEDED FOR WEIGHT GAIN OR SWELLING (Patient not taking: Reported on 09/26/2021) 30 tablet 6   glucose blood test strip Use to check blood sugars twice daily (Patient not taking: Reported on 12/05/2021) 200 each 3   No current facility-administered medications for this visit.     PHYSICAL EXAMINATION: Vitals:   12/05/21 0945  BP: 135/82  Pulse: 70  Resp: 18  Temp: 97.6 F (36.4 C)   Filed Weights   12/05/21 0945  Weight: 239 lb 1.6 oz (108.5 kg)    Physical Exam Constitutional:      General:  She is not in acute distress. HENT:     Head: Normocephalic and atraumatic.  Eyes:     General: No scleral icterus. Cardiovascular:     Rate and Rhythm: Normal rate and regular rhythm.     Heart sounds: Normal heart sounds.  Pulmonary:     Effort: Pulmonary effort is normal. No respiratory distress.     Breath sounds: No wheezing.  Abdominal:     General: Bowel sounds are normal. There is no distension.     Palpations: Abdomen is soft.  Musculoskeletal:        General: No deformity. Normal range of motion.     Cervical back: Normal range of motion and neck supple.  Skin:    General: Skin is warm and dry.  Findings: No erythema or rash.  Neurological:     Mental Status: She is alert and oriented to person, place, and time. Mental status is at baseline.     Cranial Nerves: No cranial nerve deficit.     Coordination: Coordination normal.  Psychiatric:        Mood and Affect: Mood normal.    LABORATORY DATA:  I have reviewed the data as listed Lab Results  Component Value Date   WBC 5.4 11/15/2021   HGB 7.9 Repeated and verified X2. (LL) 11/15/2021   HCT 26.5 (L) 11/15/2021   MCV 70.8 (L) 11/15/2021   PLT 374.0 11/15/2021   Recent Labs    02/07/21 1108 04/24/21 1418 05/20/21 0948 11/15/21 0859  NA 135 141 134* 136  K 4.4 4.0 3.7 3.8  CL 102 108* 104 106  CO2 '20 21 24 25  '$ GLUCOSE 119* 113* 92 88  BUN '10 13 11 12  '$ CREATININE 0.86 1.00 0.88 0.90  CALCIUM 9.9 9.6 9.5 9.3  PROT 7.5  --  7.7 7.1  ALBUMIN 4.2  --  4.1 3.8  AST 15  --  10 12  ALT 11  --  9 10  ALKPHOS 72  --  60 55  BILITOT 0.3  --  0.5 0.4   Iron/TIBC/Ferritin/ %Sat    Component Value Date/Time   IRON 17 (L) 11/15/2021 0859   TIBC 398 11/15/2021 0859   FERRITIN 4 (L) 11/15/2021 0859   IRONPCTSAT 4 (L) 11/15/2021 0859      RADIOGRAPHIC STUDIES: I have personally reviewed the radiological images as listed and agreed with the findings in the report. No results found.    ASSESSMENT &  PLAN:  1. Iron deficiency anemia due to chronic blood loss   2. Menorrhagia with irregular cycle    #Iron deficiency anemia due to chronic blood loss. Patient has been on oral iron supplementation Discussed with patient about option of IV Venofer treatments. Plan IV iron with Venofer '200mg'$  weekly x 4 doses. Risks of infusion reactions including anaphylactic reactions were discussed with patient. Other side effects include but not limited to high blood pressure, headache,wheezing, SOB, skin rash and itchiness, weight gain, leg swelling, etc. Patient voices understanding and is willing to proceed.  Menorrhagia, follow-up with gynecology.  Pending pelvic ultrasound for work-up  Orders Placed This Encounter  Procedures   Pregnancy, urine    Standing Status:   Standing    Number of Occurrences:   4    Standing Expiration Date:   12/06/2022   CBC with Differential/Platelet    Standing Status:   Future    Standing Expiration Date:   12/06/2022   Ferritin    Standing Status:   Future    Standing Expiration Date:   12/06/2022   Iron and TIBC    Standing Status:   Future    Standing Expiration Date:   12/06/2022   Pregnancy, urine    Standing Status:   Future    Standing Expiration Date:   12/06/2022    All questions were answered. The patient knows to call the clinic with any problems questions or concerns.  cc [name], MD    Return of visit: 3 months repeat labs, MD evaluation for additional need of IV Venofer treatments. Thank you for this kind referral and the opportunity to participate in the care of this patient. A copy of today's note is routed to referring provider   Earlie Server, MD, PhD [facility]  Hematology Oncology 12/05/2021

## 2021-12-11 ENCOUNTER — Other Ambulatory Visit: Payer: Self-pay | Admitting: Pharmacist

## 2021-12-11 DIAGNOSIS — E669 Obesity, unspecified: Secondary | ICD-10-CM

## 2021-12-11 MED ORDER — OZEMPIC (0.25 OR 0.5 MG/DOSE) 2 MG/3ML ~~LOC~~ SOPN: 0.5000 mg | PEN_INJECTOR | SUBCUTANEOUS | 2 refills | Status: DC

## 2021-12-13 ENCOUNTER — Inpatient Hospital Stay: Payer: Managed Care, Other (non HMO)

## 2021-12-13 VITALS — BP 145/86 | HR 83 | Temp 97.1°F | Resp 18

## 2021-12-13 DIAGNOSIS — D500 Iron deficiency anemia secondary to blood loss (chronic): Secondary | ICD-10-CM

## 2021-12-13 MED ORDER — IRON SUCROSE 20 MG/ML IV SOLN
200.0000 mg | Freq: Once | INTRAVENOUS | Status: AC
  Administered 2021-12-13: 200 mg via INTRAVENOUS
  Filled 2021-12-13: qty 10

## 2021-12-13 MED ORDER — SODIUM CHLORIDE 0.9 % IV SOLN
Freq: Once | INTRAVENOUS | Status: AC
  Filled 2021-12-13: qty 250

## 2021-12-13 MED ORDER — SODIUM CHLORIDE 0.9 % IV SOLN: 200.0000 mg | Freq: Once | INTRAVENOUS | Status: DC

## 2021-12-13 NOTE — Progress Notes (Signed)
Pt declined urine pregnancy test stating she has menstrual cycle at the moment, care team aware- ok to proceed

## 2021-12-13 NOTE — Patient Instructions (Signed)
[facility]  Discharge Instructions: ?Thank you for choosing [facility] to provide your oncology and hematology care.  ?If you have a lab appointment with the Cancer Center, please go directly to the Cancer Center and check in at the registration area. ? ?Wear comfortable clothing and clothing appropriate for easy access to any Portacath or PICC line.  ? ?We strive to give you quality time with your provider. You may need to reschedule your appointment if you arrive late (15 or more minutes).  Arriving late affects you and other patients whose appointments are after yours.  Also, if you miss three or more appointments without notifying the office, you may be dismissed from the clinic at the provider?s discretion.    ?  ?For prescription refill requests, have your pharmacy contact our office and allow 72 hours for refills to be completed.   ? ?Today you received the following chemotherapy and/or immunotherapy agents VENOFER ?    ?  ?To help prevent nausea and vomiting after your treatment, we encourage you to take your nausea medication as directed. ? ?BELOW ARE SYMPTOMS THAT SHOULD BE REPORTED IMMEDIATELY: ?*FEVER GREATER THAN 100.4 F (38 ?C) OR HIGHER ?*CHILLS OR SWEATING ?*NAUSEA AND VOMITING THAT IS NOT CONTROLLED WITH YOUR NAUSEA MEDICATION ?*UNUSUAL SHORTNESS OF BREATH ?*UNUSUAL BRUISING OR BLEEDING ?*URINARY PROBLEMS (pain or burning when urinating, or frequent urination) ?*BOWEL PROBLEMS (unusual diarrhea, constipation, pain near the anus) ?TENDERNESS IN MOUTH AND THROAT WITH OR WITHOUT PRESENCE OF ULCERS (sore throat, sores in mouth, or a toothache) ?UNUSUAL RASH, SWELLING OR PAIN  ?UNUSUAL VAGINAL DISCHARGE OR ITCHING  ? ?Items with * indicate a potential emergency and should be followed up as soon as possible or go to the Emergency Department if any problems should occur. ? ?Please show the CHEMOTHERAPY ALERT CARD or IMMUNOTHERAPY ALERT CARD at check-in to  the Emergency Department and triage nurse. ? ?Should you have questions after your visit or need to cancel or reschedule your appointment, please contact [facility]  [phone] and follow the prompts.  Office hours are 8:00 a.m. to 4:30 p.m. Monday - Friday. Please note that voicemails left after 4:00 p.m. may not be returned until the following business day.  We are closed weekends and major holidays. You have access to a nurse at all times for urgent questions. Please call the main number to the clinic [phone] and follow the prompts. ? ?For any non-urgent questions, you may also contact your provider using MyChart. We now offer e-Visits for anyone 18 and older to request care online for non-urgent symptoms. For details visit [email or web address]. ?  ?Also download the MyChart app! Go to the app store, search "MyChart", open the app, select [facility], and log in with your MyChart username and password. ? ?Due to Covid, a mask is required upon entering the hospital/clinic. If you do not have a mask, one will be given to you upon arrival. For doctor visits, patients may have 1 support person aged 18 or older with them. For treatment visits, patients cannot have anyone with them due to current Covid guidelines and our immunocompromised population.  ? ?Iron Sucrose Injection ?What is this medication? ?IRON SUCROSE (EYE ern SOO krose) treats low levels of iron (iron deficiency anemia) in people with kidney disease. Iron is a mineral that plays an important role in making red blood cells, which carry oxygen from your lungs to the rest of your body. ?This medicine   may be used for other purposes; ask your health care provider or pharmacist if you have questions. ?COMMON BRAND NAME(S): Venofer ?What should I tell my care team before I take this medication? ?They need to know if you have any of these conditions: ?Anemia not caused by low iron levels ?Heart disease ?High levels of  iron in the blood ?Kidney disease ?Liver disease ?An unusual or allergic reaction to iron, other medications, foods, dyes, or preservatives ?Pregnant or trying to get pregnant ?Breast-feeding ?How should I use this medication? ?This medication is for infusion into a vein. It is given in a hospital or clinic setting. ?Talk to your care team about the use of this medication in children. While this medication may be prescribed for children as young as 2 years for selected conditions, precautions do apply. ?Overdosage: If you think you have taken too much of this medicine contact a poison control center or emergency room at once. ?NOTE: This medicine is only for you. Do not share this medicine with others. ?What if I miss a dose? ?It is important not to miss your dose. Call your care team if you are unable to keep an appointment. ?What may interact with this medication? ?Do not take this medication with any of the following: ?Deferoxamine ?Dimercaprol ?Other iron products ?This medication may also interact with the following: ?Chloramphenicol ?Deferasirox ?This list may not describe all possible interactions. Give your health care provider a list of all the medicines, herbs, non-prescription drugs, or dietary supplements you use. Also tell them if you smoke, drink alcohol, or use illegal drugs. Some items may interact with your medicine. ?What should I watch for while using this medication? ?Visit your care team regularly. Tell your care team if your symptoms do not start to get better or if they get worse. You may need blood work done while you are taking this medication. ?You may need to follow a special diet. Talk to your care team. Foods that contain iron include: whole grains/cereals, dried fruits, beans, or peas, leafy green vegetables, and organ meats (liver, kidney). ?What side effects may I notice from receiving this medication? ?Side effects that you should report to your care team as soon as  possible: ?Allergic reactions--skin rash, itching, hives, swelling of the face, lips, tongue, or throat ?Low blood pressure--dizziness, feeling faint or lightheaded, blurry vision ?Shortness of breath ?Side effects that usually do not require medical attention (report to your care team if they continue or are bothersome): ?Flushing ?Headache ?Joint pain ?Muscle pain ?Nausea ?Pain, redness, or irritation at injection site ?This list may not describe all possible side effects. Call your doctor for medical advice about side effects. You may report side effects to FDA at 1-800-FDA-1088. ?Where should I keep my medication? ?This medication is given in a hospital or clinic and will not be stored at home. ?NOTE: This sheet is a summary. It may not cover all possible information. If you have questions about this medicine, talk to your doctor, pharmacist, or health care provider. ?? 2023 Elsevier/Gold Standard (2020-11-30 00:00:00) ? ?

## 2021-12-15 ENCOUNTER — Other Ambulatory Visit: Payer: Self-pay | Admitting: Cardiovascular Disease

## 2021-12-17 NOTE — Telephone Encounter (Signed)
Rx(s) sent to pharmacy electronically.  

## 2021-12-20 ENCOUNTER — Inpatient Hospital Stay: Payer: Managed Care, Other (non HMO) | Attending: Oncology

## 2021-12-20 ENCOUNTER — Inpatient Hospital Stay: Payer: Managed Care, Other (non HMO)

## 2021-12-20 VITALS — BP 150/83 | HR 73 | Temp 97.0°F | Resp 18

## 2021-12-20 DIAGNOSIS — D500 Iron deficiency anemia secondary to blood loss (chronic): Secondary | ICD-10-CM

## 2021-12-20 DIAGNOSIS — Z3202 Encounter for pregnancy test, result negative: Secondary | ICD-10-CM | POA: Diagnosis not present

## 2021-12-20 LAB — PREGNANCY, URINE: Preg Test, Ur: NEGATIVE

## 2021-12-20 MED ORDER — SODIUM CHLORIDE 0.9 % IV SOLN: 200.0000 mg | Freq: Once | INTRAVENOUS | Status: DC

## 2021-12-20 MED ORDER — SODIUM CHLORIDE 0.9 % IV SOLN
Freq: Once | INTRAVENOUS | Status: AC
  Filled 2021-12-20: qty 250

## 2021-12-20 MED ORDER — IRON SUCROSE 20 MG/ML IV SOLN
200.0000 mg | Freq: Once | INTRAVENOUS | Status: AC
  Administered 2021-12-20: 200 mg via INTRAVENOUS
  Filled 2021-12-20: qty 10

## 2021-12-20 NOTE — Progress Notes (Signed)
Patient tolerated Venofer infusion well, no questions/concerns voiced. Patient stable at discharge. AVS given.   ?

## 2021-12-20 NOTE — Patient Instructions (Signed)

## 2021-12-27 ENCOUNTER — Inpatient Hospital Stay: Payer: Managed Care, Other (non HMO)

## 2021-12-27 ENCOUNTER — Other Ambulatory Visit: Payer: Self-pay

## 2021-12-27 VITALS — BP 152/98 | HR 76 | Temp 98.2°F | Resp 18

## 2021-12-27 DIAGNOSIS — D500 Iron deficiency anemia secondary to blood loss (chronic): Secondary | ICD-10-CM

## 2021-12-27 LAB — PREGNANCY, URINE: Preg Test, Ur: NEGATIVE

## 2021-12-27 MED ORDER — SODIUM CHLORIDE 0.9 % IV SOLN: 200.0000 mg | Freq: Once | INTRAVENOUS | Status: DC

## 2021-12-27 MED ORDER — SODIUM CHLORIDE 0.9 % IV SOLN
Freq: Once | INTRAVENOUS | Status: AC
  Filled 2021-12-27: qty 250

## 2021-12-27 MED ORDER — IRON SUCROSE 20 MG/ML IV SOLN
200.0000 mg | Freq: Once | INTRAVENOUS | Status: AC
  Administered 2021-12-27: 200 mg via INTRAVENOUS
  Filled 2021-12-27: qty 10

## 2021-12-27 NOTE — Patient Instructions (Signed)
[facility]  Discharge Instructions: ?Thank you for choosing [facility] to provide your oncology and hematology care.  ?If you have a lab appointment with the Cancer Center, please go directly to the Cancer Center and check in at the registration area. ? ?Wear comfortable clothing and clothing appropriate for easy access to any Portacath or PICC line.  ? ?We strive to give you quality time with your provider. You may need to reschedule your appointment if you arrive late (15 or more minutes).  Arriving late affects you and other patients whose appointments are after yours.  Also, if you miss three or more appointments without notifying the office, you may be dismissed from the clinic at the provider?s discretion.    ?  ?For prescription refill requests, have your pharmacy contact our office and allow 72 hours for refills to be completed.   ? ?Today you received the following chemotherapy and/or immunotherapy agents VENOFER ?    ?  ?To help prevent nausea and vomiting after your treatment, we encourage you to take your nausea medication as directed. ? ?BELOW ARE SYMPTOMS THAT SHOULD BE REPORTED IMMEDIATELY: ?*FEVER GREATER THAN 100.4 F (38 ?C) OR HIGHER ?*CHILLS OR SWEATING ?*NAUSEA AND VOMITING THAT IS NOT CONTROLLED WITH YOUR NAUSEA MEDICATION ?*UNUSUAL SHORTNESS OF BREATH ?*UNUSUAL BRUISING OR BLEEDING ?*URINARY PROBLEMS (pain or burning when urinating, or frequent urination) ?*BOWEL PROBLEMS (unusual diarrhea, constipation, pain near the anus) ?TENDERNESS IN MOUTH AND THROAT WITH OR WITHOUT PRESENCE OF ULCERS (sore throat, sores in mouth, or a toothache) ?UNUSUAL RASH, SWELLING OR PAIN  ?UNUSUAL VAGINAL DISCHARGE OR ITCHING  ? ?Items with * indicate a potential emergency and should be followed up as soon as possible or go to the Emergency Department if any problems should occur. ? ?Please show the CHEMOTHERAPY ALERT CARD or IMMUNOTHERAPY ALERT CARD at check-in to  the Emergency Department and triage nurse. ? ?Should you have questions after your visit or need to cancel or reschedule your appointment, please contact [facility]  [phone] and follow the prompts.  Office hours are 8:00 a.m. to 4:30 p.m. Monday - Friday. Please note that voicemails left after 4:00 p.m. may not be returned until the following business day.  We are closed weekends and major holidays. You have access to a nurse at all times for urgent questions. Please call the main number to the clinic [phone] and follow the prompts. ? ?For any non-urgent questions, you may also contact your provider using MyChart. We now offer e-Visits for anyone 18 and older to request care online for non-urgent symptoms. For details visit [email or web address]. ?  ?Also download the MyChart app! Go to the app store, search "MyChart", open the app, select [facility], and log in with your MyChart username and password. ? ?Due to Covid, a mask is required upon entering the hospital/clinic. If you do not have a mask, one will be given to you upon arrival. For doctor visits, patients may have 1 support person aged 18 or older with them. For treatment visits, patients cannot have anyone with them due to current Covid guidelines and our immunocompromised population.  ? ?Iron Sucrose Injection ?What is this medication? ?IRON SUCROSE (EYE ern SOO krose) treats low levels of iron (iron deficiency anemia) in people with kidney disease. Iron is a mineral that plays an important role in making red blood cells, which carry oxygen from your lungs to the rest of your body. ?This medicine   may be used for other purposes; ask your health care provider or pharmacist if you have questions. ?COMMON BRAND NAME(S): Venofer ?What should I tell my care team before I take this medication? ?They need to know if you have any of these conditions: ?Anemia not caused by low iron levels ?Heart disease ?High levels of  iron in the blood ?Kidney disease ?Liver disease ?An unusual or allergic reaction to iron, other medications, foods, dyes, or preservatives ?Pregnant or trying to get pregnant ?Breast-feeding ?How should I use this medication? ?This medication is for infusion into a vein. It is given in a hospital or clinic setting. ?Talk to your care team about the use of this medication in children. While this medication may be prescribed for children as young as 2 years for selected conditions, precautions do apply. ?Overdosage: If you think you have taken too much of this medicine contact a poison control center or emergency room at once. ?NOTE: This medicine is only for you. Do not share this medicine with others. ?What if I miss a dose? ?It is important not to miss your dose. Call your care team if you are unable to keep an appointment. ?What may interact with this medication? ?Do not take this medication with any of the following: ?Deferoxamine ?Dimercaprol ?Other iron products ?This medication may also interact with the following: ?Chloramphenicol ?Deferasirox ?This list may not describe all possible interactions. Give your health care provider a list of all the medicines, herbs, non-prescription drugs, or dietary supplements you use. Also tell them if you smoke, drink alcohol, or use illegal drugs. Some items may interact with your medicine. ?What should I watch for while using this medication? ?Visit your care team regularly. Tell your care team if your symptoms do not start to get better or if they get worse. You may need blood work done while you are taking this medication. ?You may need to follow a special diet. Talk to your care team. Foods that contain iron include: whole grains/cereals, dried fruits, beans, or peas, leafy green vegetables, and organ meats (liver, kidney). ?What side effects may I notice from receiving this medication? ?Side effects that you should report to your care team as soon as  possible: ?Allergic reactions--skin rash, itching, hives, swelling of the face, lips, tongue, or throat ?Low blood pressure--dizziness, feeling faint or lightheaded, blurry vision ?Shortness of breath ?Side effects that usually do not require medical attention (report to your care team if they continue or are bothersome): ?Flushing ?Headache ?Joint pain ?Muscle pain ?Nausea ?Pain, redness, or irritation at injection site ?This list may not describe all possible side effects. Call your doctor for medical advice about side effects. You may report side effects to FDA at 1-800-FDA-1088. ?Where should I keep my medication? ?This medication is given in a hospital or clinic and will not be stored at home. ?NOTE: This sheet is a summary. It may not cover all possible information. If you have questions about this medicine, talk to your doctor, pharmacist, or health care provider. ?? 2023 Elsevier/Gold Standard (2020-11-30 00:00:00) ? ?

## 2022-01-03 ENCOUNTER — Inpatient Hospital Stay: Payer: Managed Care, Other (non HMO)

## 2022-01-03 ENCOUNTER — Other Ambulatory Visit: Payer: Self-pay

## 2022-01-03 VITALS — BP 135/71 | HR 76 | Temp 97.6°F | Resp 18

## 2022-01-03 DIAGNOSIS — D500 Iron deficiency anemia secondary to blood loss (chronic): Secondary | ICD-10-CM | POA: Diagnosis not present

## 2022-01-03 MED ORDER — IRON SUCROSE 20 MG/ML IV SOLN
200.0000 mg | Freq: Once | INTRAVENOUS | Status: AC
  Administered 2022-01-03: 200 mg via INTRAVENOUS
  Filled 2022-01-03: qty 10

## 2022-01-03 MED ORDER — SODIUM CHLORIDE 0.9 % IV SOLN: 200.0000 mg | Freq: Once | INTRAVENOUS | Status: DC

## 2022-01-03 MED ORDER — SODIUM CHLORIDE 0.9 % IV SOLN
Freq: Once | INTRAVENOUS | Status: AC
  Filled 2022-01-03: qty 250

## 2022-01-03 NOTE — Progress Notes (Signed)
1500: Pt refused urine pregnancy test, stating she started her menstrual cycle this morning and there is no chance of being pregnant. MD and MD team aware, and okay to proceed with Venofer.

## 2022-01-03 NOTE — Patient Instructions (Signed)

## 2022-02-18 ENCOUNTER — Telehealth: Payer: Self-pay

## 2022-02-18 NOTE — Telephone Encounter (Signed)
LMTCB. Per Dr. Derrel Nip the pt will need to reschedule her appt due to a provider meeting. Find out if pt needs any refills so we can get them refilled until her next appt.

## 2022-02-19 ENCOUNTER — Ambulatory Visit: Payer: Managed Care, Other (non HMO) | Admitting: Internal Medicine

## 2022-03-03 ENCOUNTER — Inpatient Hospital Stay: Payer: Managed Care, Other (non HMO) | Attending: Oncology

## 2022-03-03 ENCOUNTER — Telehealth: Payer: Self-pay

## 2022-03-03 DIAGNOSIS — N921 Excessive and frequent menstruation with irregular cycle: Secondary | ICD-10-CM | POA: Insufficient documentation

## 2022-03-03 DIAGNOSIS — D500 Iron deficiency anemia secondary to blood loss (chronic): Secondary | ICD-10-CM

## 2022-03-03 LAB — IRON AND TIBC
Iron: 25 ug/dL — ABNORMAL LOW (ref 28–170)
Saturation Ratios: 7 % — ABNORMAL LOW (ref 10.4–31.8)
TIBC: 371 ug/dL (ref 250–450)
UIBC: 346 ug/dL

## 2022-03-03 LAB — CBC WITH DIFFERENTIAL/PLATELET
Abs Immature Granulocytes: 0.02 10*3/uL (ref 0.00–0.07)
Basophils Absolute: 0.1 10*3/uL (ref 0.0–0.1)
Basophils Relative: 1 %
Eosinophils Absolute: 0.2 10*3/uL (ref 0.0–0.5)
Eosinophils Relative: 2 %
HCT: 32 % — ABNORMAL LOW (ref 36.0–46.0)
Hemoglobin: 9.5 g/dL — ABNORMAL LOW (ref 12.0–15.0)
Immature Granulocytes: 0 %
Lymphocytes Relative: 33 %
Lymphs Abs: 2.3 10*3/uL (ref 0.7–4.0)
MCH: 24.9 pg — ABNORMAL LOW (ref 26.0–34.0)
MCHC: 29.7 g/dL — ABNORMAL LOW (ref 30.0–36.0)
MCV: 83.8 fL (ref 80.0–100.0)
Monocytes Absolute: 0.8 10*3/uL (ref 0.1–1.0)
Monocytes Relative: 11 %
Neutro Abs: 3.7 10*3/uL (ref 1.7–7.7)
Neutrophils Relative %: 53 %
Platelets: 339 10*3/uL (ref 150–400)
RBC: 3.82 MIL/uL — ABNORMAL LOW (ref 3.87–5.11)
RDW: 18.9 % — ABNORMAL HIGH (ref 11.5–15.5)
WBC: 7.1 10*3/uL (ref 4.0–10.5)
nRBC: 0 % (ref 0.0–0.2)

## 2022-03-03 LAB — FERRITIN: Ferritin: 8 ng/mL — ABNORMAL LOW (ref 11–307)

## 2022-03-03 NOTE — Telephone Encounter (Signed)
-----   Message from Earlie Server, MD sent at 03/03/2022  3:54 PM EDT ----- [place] with that date. MD + Venofer. No need to repeat labs. ----- Message ----- From: [place] November Sent: 03/03/2022  10:18 AM EDT To: Evelina Dun, RN; Earlie Server, MD; #  Pt had labs drawn today for possible MD/ Venofer 1/2 days after. She was originally scheduled for 08/16, but can no longer make that date. She has been rescheduled out to next open slot which is 9/12. If pt needs to be seen prior please advise with best date.

## 2022-03-05 ENCOUNTER — Inpatient Hospital Stay: Payer: Managed Care, Other (non HMO) | Admitting: Oncology

## 2022-03-05 ENCOUNTER — Inpatient Hospital Stay: Payer: Managed Care, Other (non HMO)

## 2022-03-20 LAB — HM DIABETES EYE EXAM

## 2022-04-01 ENCOUNTER — Inpatient Hospital Stay: Payer: Managed Care, Other (non HMO)

## 2022-04-01 ENCOUNTER — Inpatient Hospital Stay: Payer: Managed Care, Other (non HMO) | Attending: Oncology | Admitting: Oncology

## 2022-04-01 ENCOUNTER — Encounter: Payer: Self-pay | Admitting: Oncology

## 2022-04-01 VITALS — BP 155/91 | HR 71 | Temp 97.0°F | Resp 18 | Wt 247.0 lb

## 2022-04-01 DIAGNOSIS — N921 Excessive and frequent menstruation with irregular cycle: Secondary | ICD-10-CM

## 2022-04-01 DIAGNOSIS — I10 Essential (primary) hypertension: Secondary | ICD-10-CM | POA: Diagnosis not present

## 2022-04-01 DIAGNOSIS — D500 Iron deficiency anemia secondary to blood loss (chronic): Secondary | ICD-10-CM

## 2022-04-01 DIAGNOSIS — Z803 Family history of malignant neoplasm of breast: Secondary | ICD-10-CM | POA: Insufficient documentation

## 2022-04-01 DIAGNOSIS — E119 Type 2 diabetes mellitus without complications: Secondary | ICD-10-CM | POA: Diagnosis not present

## 2022-04-01 MED ORDER — SODIUM CHLORIDE 0.9 % IV SOLN
Freq: Once | INTRAVENOUS | Status: AC
  Filled 2022-04-01: qty 250

## 2022-04-01 MED ORDER — SODIUM CHLORIDE 0.9 % IV SOLN
200.0000 mg | Freq: Once | INTRAVENOUS | Status: AC
  Administered 2022-04-01: 200 mg via INTRAVENOUS
  Filled 2022-04-01: qty 200

## 2022-04-01 NOTE — Progress Notes (Signed)
Hematology/Oncology Progress note [phone] [phone]            Patient Care Team: [name], MD as PCP - General (Internal Medicine) Christene Lye, MD (General Surgery) Earlie Server, MD as Consulting Physician (Oncology)  REFERRING PROVIDER: [name], MD  CHIEF COMPLAINTS/REASON FOR VISIT:  iron deficiency anemia  HISTORY OF PRESENTING ILLNESS:   Nicole Burns is a  46 y.o.  female with PMH listed below was seen in consultation at the request of  [name], MD  for evaluation of iron deficiency anemia  11/15/2021, patient had a CBC done which showed a hemoglobin of 7.9, MCV 70.8.  Normal white count and platelet count. Iron panel showed iron saturation of 4, ferritin of 4. Reviewed patient's previous lab records, patient has chronic anemia, dated back at least to some protein. She has heavy menstrual period patient follows up with gynecology and has a pelvic ultrasound scheduled for further work-up Patient reports feeling tired sometimes.  Denies any black or bloody stool.  Colonoscopy is up-to-date.  INTERVAL HISTORY Nicole Burns is a 46 y.o. female who has above history reviewed by me today presents for follow up visit  Patient tolerates IV Venofer treatments.  Fatigue has improved. Continues to have heavy menstrual period.  Review of Systems  Constitutional:  Negative for appetite change, chills, fatigue and fever.  HENT:   Negative for hearing loss and voice change.   Eyes:  Negative for eye problems.  Respiratory:  Negative for chest tightness and cough.   Cardiovascular:  Negative for chest pain.  Gastrointestinal:  Negative for abdominal distention, abdominal pain and blood in stool.  Endocrine: Negative for hot flashes.  Genitourinary:  Positive for menstrual problem. Negative for difficulty urinating and frequency.   Musculoskeletal:  Negative for arthralgias.  Skin:  Negative for itching and rash.   Neurological:  Negative for extremity weakness.  Hematological:  Negative for adenopathy.  Psychiatric/Behavioral:  Negative for confusion.     MEDICAL HISTORY:  Past Medical History:  Diagnosis Date   Abortion, missed 08/09/2015   Diabetes mellitus without complication ([place])    Fatty liver    Gallbladder polyp    Hepatic cyst    History of chicken pox    Hypertension    Lower extremity edema 09/10/2021   Migraine    After menses cycle.    Pure hypercholesterolemia 2/35/3614   Umbilical hernia 4315    SURGICAL HISTORY: Past Surgical History:  Procedure Laterality Date   CESAREAN SECTION     1993 & 2003   COLONOSCOPY WITH PROPOFOL N/A 04/26/2021   Procedure: COLONOSCOPY WITH PROPOFOL;  Surgeon: Virgel Manifold, MD;  Location: [facility];  Service: Endoscopy;  Laterality: N/A;   DILATION AND EVACUATION N/A 08/01/2015   Procedure: DILATATION AND EVACUATION With Tissue Sent For Chromosomal Analysis;  Surgeon: Servando Salina, MD;  Location: [facility];  Service: Gynecology;  Laterality: N/A;   HERNIA REPAIR  2007   OPERATIVE ULTRASOUND N/A 08/01/2015   Procedure: OPERATIVE ULTRASOUND;  Surgeon: Servando Salina, MD;  Location: [facility];  Service: Gynecology;  Laterality: N/A;    SOCIAL HISTORY: Social History   Socioeconomic History   Marital status: Married    Spouse name: Not on file   Number of children: Not on file   Years of education: Not on file   Highest education level: Not on file  Occupational History   Not on file  Tobacco Use   Smoking status: Never  Smokeless tobacco: Never  Vaping Use   Vaping Use: Never used  Substance and Sexual Activity   Alcohol use: No   Drug use: No   Sexual activity: Yes  Other Topics Concern   Not on file  Social History Narrative   Not on file   Social Determinants of Health   Financial Resource Strain: Low Risk  (09/10/2021)   Overall Financial Resource Strain (CARDIA)    Difficulty of Paying Living Expenses:  Not hard at all  Food Insecurity: No Food Insecurity (09/10/2021)   Hunger Vital Sign    Worried About Running Out of Food in the Last Year: Never true    Ran Out of Food in the Last Year: Never true  Transportation Needs: No Transportation Needs (09/10/2021)   PRAPARE - Hydrologist (Medical): No    Lack of Transportation (Non-Medical): No  Physical Activity: Inactive (09/10/2021)   Exercise Vital Sign    Days of Exercise per Week: 0 days    Minutes of Exercise per Session: 0 min  Stress: Not on file  Social Connections: Not on file  Intimate Partner Violence: Not on file    FAMILY HISTORY: Family History  Problem Relation Age of Onset   Hypertension Mother    Hyperlipidemia Father    Heart disease Father    Stroke Father    Diabetes Father    Hypertension Father    Hypertension Sister    Hyperlipidemia Maternal Grandmother    Cancer Maternal Grandmother        Breast   Breast cancer Maternal Grandmother    Kidney disease Paternal Grandmother    Diabetes Paternal Grandmother     ALLERGIES:  is allergic to lisinopril, maxzide [hydrochlorothiazide w-triamterene], and vicodin [hydrocodone-acetaminophen].  MEDICATIONS:  Current Outpatient Medications  Medication Sig Dispense Refill   amLODipine (NORVASC) 10 MG tablet TAKE 1 TABLET BY MOUTH EVERY DAY 90 tablet 3   furosemide (LASIX) 20 MG tablet TAKE ONE TAB DAILY AS NEEDED FOR WEIGHT GAIN OR SWELLING 30 tablet 6   glucose blood test strip Use to check blood sugars twice daily 200 each 3   HOMEOPATHIC PRODUCTS PO Take by mouth. MegaFood Blood Builder - vitamin C 15 mg, folic acid 622 mcg, Vitamin B12 30 mcg, Iron 26 mg     ondansetron (ZOFRAN) 4 MG tablet Take 1 tablet (4 mg total) by mouth every 8 (eight) hours as needed for nausea or vomiting. 30 tablet 1   OZEMPIC, 0.25 OR 0.5 MG/DOSE, 2 MG/3ML SOPN Inject 0.5 mg into the skin once a week. 3 mL 2   spironolactone (ALDACTONE) 25 MG tablet TAKE 1  TABLET (25 MG TOTAL) BY MOUTH DAILY. 30 tablet 6   No current facility-administered medications for this visit.     PHYSICAL EXAMINATION: Vitals:   04/01/22 1444  BP: (!) 155/91  Pulse: 71  Resp: 18  Temp: (!) 97 F (36.1 C)   Filed Weights   04/01/22 1444  Weight: 247 lb (112 kg)    Physical Exam Constitutional:      General: She is not in acute distress. HENT:     Head: Normocephalic and atraumatic.  Eyes:     General: No scleral icterus. Cardiovascular:     Rate and Rhythm: Normal rate and regular rhythm.     Heart sounds: Normal heart sounds.  Pulmonary:     Effort: Pulmonary effort is normal. No respiratory distress.     Breath sounds: No wheezing.  Abdominal:     General: Bowel sounds are normal. There is no distension.     Palpations: Abdomen is soft.  Musculoskeletal:        General: No deformity. Normal range of motion.     Cervical back: Normal range of motion and neck supple.  Skin:    General: Skin is warm and dry.     Findings: No erythema or rash.  Neurological:     Mental Status: She is alert and oriented to person, place, and time. Mental status is at baseline.     Cranial Nerves: No cranial nerve deficit.     Coordination: Coordination normal.  Psychiatric:        Mood and Affect: Mood normal.     LABORATORY DATA:  I have reviewed the data as listed Lab Results  Component Value Date   WBC 7.1 03/03/2022   HGB 9.5 (L) 03/03/2022   HCT 32.0 (L) 03/03/2022   MCV 83.8 03/03/2022   PLT 339 03/03/2022   Recent Labs    04/24/21 1418 05/20/21 0948 11/15/21 0859  NA 141 134* 136  K 4.0 3.7 3.8  CL 108* 104 106  CO2 '21 24 25  '$ GLUCOSE 113* 92 88  BUN '13 11 12  '$ CREATININE 1.00 0.88 0.90  CALCIUM 9.6 9.5 9.3  PROT  --  7.7 7.1  ALBUMIN  --  4.1 3.8  AST  --  10 12  ALT  --  9 10  ALKPHOS  --  60 55  BILITOT  --  0.5 0.4    Iron/TIBC/Ferritin/ %Sat    Component Value Date/Time   IRON 25 (L) 03/03/2022 0935   TIBC 371 03/03/2022  0935   FERRITIN 8 (L) 03/03/2022 0935   IRONPCTSAT 7 (L) 03/03/2022 0935   IRONPCTSAT 4 (L) 11/15/2021 0859      RADIOGRAPHIC STUDIES: I have personally reviewed the radiological images as listed and agreed with the findings in the report. No results found.    ASSESSMENT & PLAN:  1. Iron deficiency anemia due to chronic blood loss   2. Menorrhagia with irregular cycle    #Iron deficiency anemia due to chronic blood loss. Labs reviewed and discussed with patient. Hemoglobin and iron panel have both improved.  Still low Recommend additional IV Venofer weekly x4.  Patient declines pregnancy testing today.  Menorrhagia, follow-up with gynecology.  Pending pelvic ultrasound for work-up  Orders Placed This Encounter  Procedures   Pregnancy, urine    Standing Status:   Future    Standing Expiration Date:   04/02/2023   CBC with Differential/Platelet    Standing Status:   Future    Standing Expiration Date:   04/01/2023   Ferritin    Standing Status:   Future    Standing Expiration Date:   04/02/2023   Iron and TIBC([facility])    Standing Status:   Future    Standing Expiration Date:   04/02/2023    All questions were answered. The patient knows to call the clinic with any problems questions or concerns.  cc [name], MD    Return of visit: 4 months repeat labs, MD evaluation for additional need of IV Venofer treatments.  Earlie Server, MD, PhD [facility] Hematology Oncology 04/01/2022

## 2022-04-01 NOTE — Progress Notes (Signed)
Patient denies any concerns today.  

## 2022-04-01 NOTE — Patient Instructions (Signed)
[facility] AT [place]  Discharge Instructions: Thank you for choosing [facility] to provide your oncology and hematology care.  If you have a lab appointment with the [facility], please go directly to the [facility] and check in at the registration area.  Wear comfortable clothing and clothing appropriate for easy access to any Portacath or PICC line.   We strive to give you quality time with your provider. You may need to reschedule your appointment if you arrive late (15 or more minutes).  Arriving late affects you and other patients whose appointments are after yours.  Also, if you miss three or more appointments without notifying the office, you may be dismissed from the clinic at the provider's discretion.      For prescription refill requests, have your pharmacy contact our office and allow 72 hours for refills to be completed.    Today you received the following chemotherapy and/or immunotherapy agents Iron Sucrose.      To help prevent nausea and vomiting after your treatment, we encourage you to take your nausea medication as directed.  BELOW ARE SYMPTOMS THAT SHOULD BE REPORTED IMMEDIATELY: *FEVER GREATER THAN 100.4 F (38 C) OR HIGHER *CHILLS OR SWEATING *NAUSEA AND VOMITING THAT IS NOT CONTROLLED WITH YOUR NAUSEA MEDICATION *UNUSUAL SHORTNESS OF BREATH *UNUSUAL BRUISING OR BLEEDING *URINARY PROBLEMS (pain or burning when urinating, or frequent urination) *BOWEL PROBLEMS (unusual diarrhea, constipation, pain near the anus) TENDERNESS IN MOUTH AND THROAT WITH OR WITHOUT PRESENCE OF ULCERS (sore throat, sores in mouth, or a toothache) UNUSUAL RASH, SWELLING OR PAIN  UNUSUAL VAGINAL DISCHARGE OR ITCHING   Items with * indicate a potential emergency and should be followed up as soon as possible or go to the Emergency Department if any problems should occur.  Please show the CHEMOTHERAPY ALERT CARD or IMMUNOTHERAPY ALERT CARD at check-in  to the Emergency Department and triage nurse.  Should you have questions after your visit or need to cancel or reschedule your appointment, please contact [facility] [place] AT [place]  [phone] and follow the prompts.  Office hours are 8:00 a.m. to 4:30 p.m. Monday - Friday. Please note that voicemails left after 4:00 p.m. may not be returned until the following business day.  We are closed weekends and major holidays. You have access to a nurse at all times for urgent questions. Please call the main number to the clinic [phone] and follow the prompts.  For any non-urgent questions, you may also contact your provider using MyChart. We now offer e-Visits for anyone 39 and older to request care online for non-urgent symptoms. For details visit [email or web address].   Also download the MyChart app! Go to the app store, search "MyChart", open the app, select [facility], and log in with your MyChart username and password.  Masks are optional in the cancer centers. If you would like for your care team to wear a mask while they are taking care of you, please let them know. For doctor visits, patients may have with them one support person who is at least 48 years old. At this time, visitors are not allowed in the infusion area.  Iron Sucrose Injection What is this medication? IRON SUCROSE (EYE ern SOO krose) treats low levels of iron (iron deficiency anemia) in people with kidney disease. Iron is a mineral that plays an important role in making red blood cells, which carry oxygen from your lungs to the rest of your body. This medicine may  be used for other purposes; ask your health care provider or pharmacist if you have questions. COMMON BRAND NAME(S): Venofer What should I tell my care team before I take this medication? They need to know if you have any of these conditions: Anemia not caused by low iron levels Heart disease High levels of iron in the blood Kidney  disease Liver disease An unusual or allergic reaction to iron, other medications, foods, dyes, or preservatives Pregnant or trying to get pregnant Breast-feeding How should I use this medication? This medication is for infusion into a vein. It is given in a hospital or clinic setting. Talk to your care team about the use of this medication in children. While this medication may be prescribed for children as young as 2 years for selected conditions, precautions do apply. Overdosage: If you think you have taken too much of this medicine contact a poison control center or emergency room at once. NOTE: This medicine is only for you. Do not share this medicine with others. What if I miss a dose? It is important not to miss your dose. Call your care team if you are unable to keep an appointment. What may interact with this medication? Do not take this medication with any of the following: Deferoxamine Dimercaprol Other iron products This medication may also interact with the following: Chloramphenicol Deferasirox This list may not describe all possible interactions. Give your health care provider a list of all the medicines, herbs, non-prescription drugs, or dietary supplements you use. Also tell them if you smoke, drink alcohol, or use illegal drugs. Some items may interact with your medicine. What should I watch for while using this medication? Visit your care team regularly. Tell your care team if your symptoms do not start to get better or if they get worse. You may need blood work done while you are taking this medication. You may need to follow a special diet. Talk to your care team. Foods that contain iron include: whole grains/cereals, dried fruits, beans, or peas, leafy green vegetables, and organ meats (liver, kidney). What side effects may I notice from receiving this medication? Side effects that you should report to your care team as soon as possible: Allergic reactions--skin rash,  itching, hives, swelling of the face, lips, tongue, or throat Low blood pressure--dizziness, feeling faint or lightheaded, blurry vision Shortness of breath Side effects that usually do not require medical attention (report to your care team if they continue or are bothersome): Flushing Headache Joint pain Muscle pain Nausea Pain, redness, or irritation at injection site This list may not describe all possible side effects. Call your doctor for medical advice about side effects. You may report side effects to FDA at 1-800-FDA-1088. Where should I keep my medication? This medication is given in a hospital or clinic and will not be stored at home. NOTE: This sheet is a summary. It may not cover all possible information. If you have questions about this medicine, talk to your doctor, pharmacist, or health care provider.  2023 Elsevier/Gold Standard (2007-08-28 00:00:00)

## 2022-04-08 ENCOUNTER — Inpatient Hospital Stay: Payer: Managed Care, Other (non HMO)

## 2022-04-15 ENCOUNTER — Ambulatory Visit (INDEPENDENT_AMBULATORY_CARE_PROVIDER_SITE_OTHER): Payer: Managed Care, Other (non HMO) | Admitting: Internal Medicine

## 2022-04-15 ENCOUNTER — Encounter: Payer: Self-pay | Admitting: Internal Medicine

## 2022-04-15 VITALS — BP 128/78 | HR 79 | Temp 98.5°F | Ht 63.0 in | Wt 245.2 lb

## 2022-04-15 DIAGNOSIS — I10 Essential (primary) hypertension: Secondary | ICD-10-CM | POA: Diagnosis not present

## 2022-04-15 DIAGNOSIS — E669 Obesity, unspecified: Secondary | ICD-10-CM | POA: Diagnosis not present

## 2022-04-15 DIAGNOSIS — E7800 Pure hypercholesterolemia, unspecified: Secondary | ICD-10-CM

## 2022-04-15 DIAGNOSIS — E1169 Type 2 diabetes mellitus with other specified complication: Secondary | ICD-10-CM

## 2022-04-15 DIAGNOSIS — D500 Iron deficiency anemia secondary to blood loss (chronic): Secondary | ICD-10-CM

## 2022-04-15 MED ORDER — SPIRONOLACTONE 25 MG PO TABS: 25.0000 mg | ORAL_TABLET | Freq: Every day | ORAL | 1 refills | Status: DC

## 2022-04-15 MED ORDER — ALPRAZOLAM 0.25 MG PO TABS: 0.2500 mg | ORAL_TABLET | Freq: Every day | ORAL | 0 refills | Status: DC | PRN

## 2022-04-15 MED ORDER — OZEMPIC (0.25 OR 0.5 MG/DOSE) 2 MG/1.5ML ~~LOC~~ SOPN: 0.5000 mg | PEN_INJECTOR | SUBCUTANEOUS | 2 refills | Status: DC

## 2022-04-15 NOTE — Patient Instructions (Addendum)
Try using medication regularly for constipation :  Metamucil  citrucel,  miralax or fibercon:  ok to use daily  Ok to use colace (docusate) a stool softener  not more than 100 mg daily   Dulcolax (bisacodyl) can be used twice weekly at the most  Walk every single day for 30 minutes   every day   Continue 0.5 mg weekly dose    Veggies Made Smart !  Makes frittatas and muffins that are low carb and low cal   Premier protein  shakes

## 2022-04-15 NOTE — Progress Notes (Signed)
Subjective:  Patient ID: Nicole Burns, female    DOB: 1975-10-17  Age: 46 y.o. MRN: [id number]  CC: The primary encounter diagnosis was Essential hypertension, benign. Diagnoses of Diabetes mellitus type 2 in obese ([place]), Pure hypercholesterolemia, Obesity, morbid ([place]), and Iron deficiency anemia due to chronic blood loss were also pertinent to this visit.   HPI Nicole Burns presents for  Chief Complaint  Patient presents with   Follow-up    Follow up on diabetes    1) IDA:  improving  with infusions of iron via hematology .  Gets very nervous before infusion,  blood pressure becomes elevated   2) Type 2 DM WITH OBESITY  has been taking ozempic since October 2022.  Currently on the dose of  0.5 MG WEEKLY  , has not been able to advance dose because she doesn't always tolerate this dose  due to constipation or nausea    Weight has fluctuated   starting weight 254,  dropped to 238 earlier in the year,  now 245 .  A1c 5.4  does not check sugar but feels hypoglycemic before her next dose of ozempic BUT has not checked blood sugar.  Does  not eat even two meals daily sometimes . Frustrated at her inability to lose weight multiple family members at home all with diferent dietary needs.  Not exercising ,  not walking daily    Outpatient Medications Prior to Visit  Medication Sig Dispense Refill   amLODipine (NORVASC) 10 MG tablet TAKE 1 TABLET BY MOUTH EVERY DAY 90 tablet 3   furosemide (LASIX) 20 MG tablet TAKE ONE TAB DAILY AS NEEDED FOR WEIGHT GAIN OR SWELLING 30 tablet 6   glucose blood test strip Use to check blood sugars twice daily 200 each 3   HOMEOPATHIC PRODUCTS PO Take by mouth. Mega[employer] Blood Builder - vitamin C 15 mg, folic acid 309 mcg, Vitamin B12 30 mcg, Iron 26 mg     ondansetron (ZOFRAN) 4 MG tablet Take 1 tablet (4 mg total) by mouth every 8 (eight) hours as needed for nausea or vomiting. 30 tablet 1   OZEMPIC, 0.25 OR 0.5 MG/DOSE, 2 MG/3ML SOPN Inject 0.5 mg into the  skin once a week. 3 mL 2   spironolactone (ALDACTONE) 25 MG tablet TAKE 1 TABLET (25 MG TOTAL) BY MOUTH DAILY. 30 tablet 6   No facility-administered medications prior to visit.    Review of Systems;  Patient denies headache, fevers, malaise, unintentional weight loss, skin rash, eye pain, sinus congestion and sinus pain, sore throat, dysphagia,  hemoptysis , cough, dyspnea, wheezing, chest pain, palpitations, orthopnea, edema, abdominal pain, nausea, melena, diarrhea, constipation, flank pain, dysuria, hematuria, urinary  Frequency, nocturia, numbness, tingling, seizures,  Focal weakness, Loss of consciousness,  Tremor, insomnia, depression, anxiety, and suicidal ideation.      Objective:  BP 128/78 (BP Location: Left Arm, Patient Position: Sitting, Cuff Size: Large)   Pulse 79   Temp 98.5 F (36.9 C) (Oral)   Ht 5' 3"  (1.6 m)   Wt 245 lb 3.2 oz (111.2 kg)   SpO2 99%   BMI 43.44 kg/m   BP Readings from Last 3 Encounters:  04/15/22 128/78  04/01/22 (!) 155/91  01/03/22 135/71    Wt Readings from Last 3 Encounters:  04/15/22 245 lb 3.2 oz (111.2 kg)  04/01/22 247 lb (112 kg)  12/05/21 239 lb 1.6 oz (108.5 kg)    General appearance: alert, cooperative and appears stated age Ears: normal TM's  and external ear canals both ears Throat: lips, mucosa, and tongue normal; teeth and gums normal Neck: no adenopathy, no carotid bruit, supple, symmetrical, trachea midline and thyroid not enlarged, symmetric, no tenderness/mass/nodules Back: symmetric, no curvature. ROM normal. No CVA tenderness. Lungs: clear to auscultation bilaterally Heart: regular rate and rhythm, S1, S2 normal, no murmur, click, rub or gallop Abdomen: soft, non-tender; bowel sounds normal; no masses,  no organomegaly Pulses: 2+ and symmetric Skin: Skin color, texture, turgor normal. No rashes or lesions Lymph nodes: Cervical, supraclavicular, and axillary nodes normal. Neuro:  awake and interactive with normal  mood and affect. Higher cortical functions are normal. Speech is clear without word-finding difficulty or dysarthria. Extraocular movements are intact. Visual fields of both eyes are grossly intact. Sensation to light touch is grossly intact bilaterally of upper and lower extremities. Motor examination shows 4+/5 symmetric hand grip and upper extremity and 5/5 lower extremity strength. There is no pronation or drift. Gait is non-ataxic   Lab Results  Component Value Date   HGBA1C 5.4 11/15/2021   HGBA1C 5.7 05/20/2021   HGBA1C 7.0 (H) 02/07/2021    Lab Results  Component Value Date   CREATININE 0.90 11/15/2021   CREATININE 0.88 05/20/2021   CREATININE 1.00 04/24/2021    Lab Results  Component Value Date   WBC 7.1 03/03/2022   HGB 9.5 (L) 03/03/2022   HCT 32.0 (L) 03/03/2022   PLT 339 03/03/2022   GLUCOSE 88 11/15/2021   CHOL 137 11/15/2021   TRIG 53.0 11/15/2021   HDL 43.40 11/15/2021   LDLCALC 83 11/15/2021   ALT 10 11/15/2021   AST 12 11/15/2021   NA 136 11/15/2021   K 3.8 11/15/2021   CL 106 11/15/2021   CREATININE 0.90 11/15/2021   BUN 12 11/15/2021   CO2 25 11/15/2021   TSH 2.05 08/18/2019   HGBA1C 5.4 11/15/2021   MICROALBUR 1.0 05/20/2021    No results found.  Assessment & Plan:   Problem List Items Addressed This Visit     Obesity, morbid ([place])    With fatty liver, diabetes and hypertension .  Improving BMI with Ozempic.  encoraged to start exercising ,  Dietary changes reviewed.       Relevant Medications   Semaglutide,0.25 or 0.5MG/DOS, (OZEMPIC, 0.25 OR 0.5 MG/DOSE,) 2 MG/1.5ML SOPN   Essential hypertension, benign - Primary    Did not tolerate olmesartan due to nausea and diarrhea. Had muscle cramps with hctz.    BP improved with weight loss . Continue spironlactone and  Amlodipine.    Lab Results  Component Value Date   CREATININE 0.90 11/15/2021   Lab Results  Component Value Date   NA 136 11/15/2021   K 3.8 11/15/2021   CL 106 11/15/2021    CO2 25 11/15/2021   Lab Results  Component Value Date   MICROALBUR 1.0 05/20/2021   MICROALBUR 6.6 (H) 08/18/2019          Relevant Medications   spironolactone (ALDACTONE) 25 MG tablet   Other Relevant Orders   Comp Met (CMET)   Urine Microalbumin w/creat. ratio   Diabetes mellitus type 2 in obese (HCC)    With fatty liver, obesity  and hypertension . Her a1c has normalized but she has reached a plateau in her weight due to constipation . Reviewed bowel regimen and suggestions mae.    encouraged to start exercising       Relevant Medications   Semaglutide,0.25 or 0.5MG/DOS, (OZEMPIC, 0.25 OR 0.5 MG/DOSE,) 2  MG/1.5ML SOPN   Other Relevant Orders   HgB A1c   Comp Met (CMET)   Urine Microalbumin w/creat. ratio   Iron deficiency anemia due to chronic blood loss    Asymptomatic,  Managed with iv iron infusion .  Secondary to menorrhagia       Pure hypercholesterolemia   Relevant Medications   spironolactone (ALDACTONE) 25 MG tablet    I spent a total of  30 minutes with this patient in a face to face visit on the date of this encounter reviewing the last office visit with cardiology , patient's diet and exercise habits, home blood pressure /blood sugar readings,   and post visit ordering of testing and therapeutics.    Follow-up: Return in about 3 months (around 07/15/2022).   [name], MD

## 2022-04-15 NOTE — Assessment & Plan Note (Signed)
Asymptomatic,  Managed with iv iron infusion .  Secondary to menorrhagia

## 2022-04-15 NOTE — Assessment & Plan Note (Signed)
With fatty liver, diabetes and hypertension .  Improving BMI with Ozempic.  encoraged to start exercising ,  Dietary changes reviewed.

## 2022-04-15 NOTE — Assessment & Plan Note (Signed)
Did not tolerate olmesartan due to nausea and diarrhea. Had muscle cramps with hctz.    BP improved with weight loss . Continue spironlactone and  Amlodipine.    Lab Results  Component Value Date   CREATININE 0.90 11/15/2021   Lab Results  Component Value Date   NA 136 11/15/2021   K 3.8 11/15/2021   CL 106 11/15/2021   CO2 25 11/15/2021   Lab Results  Component Value Date   MICROALBUR 1.0 05/20/2021   MICROALBUR 6.6 (H) 08/18/2019

## 2022-04-15 NOTE — Assessment & Plan Note (Signed)
With fatty liver, obesity  and hypertension . Her a1c has normalized but she has reached a plateau in her weight due to constipation . Reviewed bowel regimen and suggestions mae.    encouraged to start exercising

## 2022-04-16 ENCOUNTER — Inpatient Hospital Stay: Payer: Managed Care, Other (non HMO)

## 2022-04-16 LAB — MICROALBUMIN / CREATININE URINE RATIO
Creatinine,U: 157.8 mg/dL
Microalb Creat Ratio: 0.5 mg/g (ref 0.0–30.0)
Microalb, Ur: 0.8 mg/dL (ref 0.0–1.9)

## 2022-04-16 LAB — COMPREHENSIVE METABOLIC PANEL
ALT: 12 U/L (ref 0–35)
AST: 13 U/L (ref 0–37)
Albumin: 4.2 g/dL (ref 3.5–5.2)
Alkaline Phosphatase: 65 U/L (ref 39–117)
BUN: 11 mg/dL (ref 6–23)
CO2: 24 mEq/L (ref 19–32)
Calcium: 9.6 mg/dL (ref 8.4–10.5)
Chloride: 104 mEq/L (ref 96–112)
Creatinine, Ser: 0.92 mg/dL (ref 0.40–1.20)
GFR: 73.63 mL/min (ref >60.00)
Glucose, Bld: 90 mg/dL (ref 70–99)
Potassium: 3.6 mEq/L (ref 3.5–5.1)
Sodium: 135 mEq/L (ref 135–145)
Total Bilirubin: 0.5 mg/dL (ref 0.2–1.2)
Total Protein: 7.9 g/dL (ref 6.0–8.3)

## 2022-04-16 LAB — HEMOGLOBIN A1C: Hgb A1c MFr Bld: 5.4 % (ref 4.6–6.5)

## 2022-04-22 ENCOUNTER — Inpatient Hospital Stay: Payer: Managed Care, Other (non HMO) | Attending: Oncology

## 2022-04-22 ENCOUNTER — Inpatient Hospital Stay: Payer: Managed Care, Other (non HMO)

## 2022-06-06 ENCOUNTER — Telehealth: Payer: Self-pay | Admitting: Oncology

## 2022-06-06 NOTE — Telephone Encounter (Signed)
Please r/s the missed 3 weekly venofer appts and let pt know  Lab (urine preg)/ venofer

## 2022-06-06 NOTE — Telephone Encounter (Signed)
Patient called to ask when her Iron infusions would start. She had 3 scheduled and didn't show for any of them. She would like to get them scheduled now. Please advise on scheduling.  Thank you

## 2022-06-23 ENCOUNTER — Inpatient Hospital Stay: Payer: Managed Care, Other (non HMO) | Attending: Oncology

## 2022-06-23 ENCOUNTER — Inpatient Hospital Stay: Payer: Managed Care, Other (non HMO)

## 2022-06-23 ENCOUNTER — Other Ambulatory Visit: Payer: Self-pay | Admitting: Oncology

## 2022-06-23 VITALS — BP 128/87 | HR 72 | Temp 98.3°F | Resp 18

## 2022-06-23 DIAGNOSIS — D500 Iron deficiency anemia secondary to blood loss (chronic): Secondary | ICD-10-CM | POA: Insufficient documentation

## 2022-06-23 DIAGNOSIS — Z3202 Encounter for pregnancy test, result negative: Secondary | ICD-10-CM | POA: Insufficient documentation

## 2022-06-23 DIAGNOSIS — N921 Excessive and frequent menstruation with irregular cycle: Secondary | ICD-10-CM | POA: Insufficient documentation

## 2022-06-23 LAB — PREGNANCY, URINE: Preg Test, Ur: NEGATIVE

## 2022-06-23 MED ORDER — SODIUM CHLORIDE 0.9 % IV SOLN
200.0000 mg | Freq: Once | INTRAVENOUS | Status: AC
  Administered 2022-06-23: 200 mg via INTRAVENOUS
  Filled 2022-06-23: qty 200

## 2022-06-23 MED ORDER — SODIUM CHLORIDE 0.9 % IV SOLN
INTRAVENOUS | Status: DC
  Filled 2022-06-23: qty 250

## 2022-06-27 MED FILL — Iron Sucrose Inj 20 MG/ML (Fe Equiv): INTRAVENOUS | Qty: 10 | Status: AC

## 2022-06-30 ENCOUNTER — Inpatient Hospital Stay: Payer: Managed Care, Other (non HMO)

## 2022-06-30 VITALS — BP 146/85 | HR 87 | Temp 98.2°F | Resp 18

## 2022-06-30 DIAGNOSIS — D500 Iron deficiency anemia secondary to blood loss (chronic): Secondary | ICD-10-CM | POA: Diagnosis not present

## 2022-06-30 LAB — CBC WITH DIFFERENTIAL/PLATELET
Abs Immature Granulocytes: 0.04 10*3/uL (ref 0.00–0.07)
Basophils Absolute: 0 10*3/uL (ref 0.0–0.1)
Basophils Relative: 0 %
Eosinophils Absolute: 0.3 10*3/uL (ref 0.0–0.5)
Eosinophils Relative: 3 %
HCT: 33.2 % — ABNORMAL LOW (ref 36.0–46.0)
Hemoglobin: 9.5 g/dL — ABNORMAL LOW (ref 12.0–15.0)
Immature Granulocytes: 0 %
Lymphocytes Relative: 26 %
Lymphs Abs: 2.4 10*3/uL (ref 0.7–4.0)
MCH: 23.1 pg — ABNORMAL LOW (ref 26.0–34.0)
MCHC: 28.6 g/dL — ABNORMAL LOW (ref 30.0–36.0)
MCV: 80.8 fL (ref 80.0–100.0)
Monocytes Absolute: 1 10*3/uL (ref 0.1–1.0)
Monocytes Relative: 11 %
Neutro Abs: 5.3 10*3/uL (ref 1.7–7.7)
Neutrophils Relative %: 60 %
Platelets: 388 10*3/uL (ref 150–400)
RBC: 4.11 MIL/uL (ref 3.87–5.11)
RDW: 17.6 % — ABNORMAL HIGH (ref 11.5–15.5)
WBC: 9 10*3/uL (ref 4.0–10.5)
nRBC: 0 % (ref 0.0–0.2)

## 2022-06-30 LAB — IRON AND TIBC
Iron: 36 ug/dL (ref 28–170)
Saturation Ratios: 10 % — ABNORMAL LOW (ref 10.4–31.8)
TIBC: 377 ug/dL (ref 250–450)
UIBC: 341 ug/dL

## 2022-06-30 LAB — PREGNANCY, URINE: Preg Test, Ur: NEGATIVE

## 2022-06-30 LAB — FERRITIN: Ferritin: 56 ng/mL (ref 11–307)

## 2022-06-30 MED ORDER — SODIUM CHLORIDE 0.9 % IV SOLN
200.0000 mg | Freq: Once | INTRAVENOUS | Status: AC
  Administered 2022-06-30: 200 mg via INTRAVENOUS
  Filled 2022-06-30: qty 200

## 2022-06-30 MED ORDER — SODIUM CHLORIDE 0.9 % IV SOLN
INTRAVENOUS | Status: DC | PRN
  Filled 2022-06-30: qty 250

## 2022-07-07 ENCOUNTER — Other Ambulatory Visit: Payer: Self-pay

## 2022-07-07 ENCOUNTER — Inpatient Hospital Stay: Payer: Managed Care, Other (non HMO)

## 2022-07-07 VITALS — BP 146/70 | HR 64 | Temp 97.7°F

## 2022-07-07 DIAGNOSIS — D500 Iron deficiency anemia secondary to blood loss (chronic): Secondary | ICD-10-CM

## 2022-07-07 LAB — PREGNANCY, URINE: Preg Test, Ur: NEGATIVE

## 2022-07-07 MED ORDER — SODIUM CHLORIDE 0.9 % IV SOLN
INTRAVENOUS | Status: DC
  Filled 2022-07-07: qty 250

## 2022-07-07 MED ORDER — SODIUM CHLORIDE 0.9 % IV SOLN
200.0000 mg | Freq: Once | INTRAVENOUS | Status: AC
  Administered 2022-07-07: 200 mg via INTRAVENOUS
  Filled 2022-07-07: qty 200

## 2022-07-07 NOTE — Patient Instructions (Signed)
[facility] AT [place]  Discharge Instructions: Thank you for choosing [facility] to provide your oncology and hematology care.  If you have a lab appointment with the [facility], please go directly to the [facility] and check in at the registration area.  Wear comfortable clothing and clothing appropriate for easy access to any Portacath or PICC line.   We strive to give you quality time with your provider. You may need to reschedule your appointment if you arrive late (15 or more minutes).  Arriving late affects you and other patients whose appointments are after yours.  Also, if you miss three or more appointments without notifying the office, you may be dismissed from the clinic at the provider's discretion.      For prescription refill requests, have your pharmacy contact our office and allow 72 hours for refills to be completed.    Today you received the following chemotherapy and/or immunotherapy agents- Venofer      To help prevent nausea and vomiting after your treatment, we encourage you to take your nausea medication as directed.  BELOW ARE SYMPTOMS THAT SHOULD BE REPORTED IMMEDIATELY: *FEVER GREATER THAN 100.4 F (38 C) OR HIGHER *CHILLS OR SWEATING *NAUSEA AND VOMITING THAT IS NOT CONTROLLED WITH YOUR NAUSEA MEDICATION *UNUSUAL SHORTNESS OF BREATH *UNUSUAL BRUISING OR BLEEDING *URINARY PROBLEMS (pain or burning when urinating, or frequent urination) *BOWEL PROBLEMS (unusual diarrhea, constipation, pain near the anus) TENDERNESS IN MOUTH AND THROAT WITH OR WITHOUT PRESENCE OF ULCERS (sore throat, sores in mouth, or a toothache) UNUSUAL RASH, SWELLING OR PAIN  UNUSUAL VAGINAL DISCHARGE OR ITCHING   Items with * indicate a potential emergency and should be followed up as soon as possible or go to the Emergency Department if any problems should occur.  Please show the CHEMOTHERAPY ALERT CARD or IMMUNOTHERAPY ALERT CARD at check-in to  the Emergency Department and triage nurse.  Should you have questions after your visit or need to cancel or reschedule your appointment, please contact [facility] CANCER [place] AT [place]  [phone] and follow the prompts.  Office hours are 8:00 a.m. to 4:30 p.m. Monday - Friday. Please note that voicemails left after 4:00 p.m. may not be returned until the following business day.  We are closed weekends and major holidays. You have access to a nurse at all times for urgent questions. Please call the main number to the clinic [phone] and follow the prompts.  For any non-urgent questions, you may also contact your provider using MyChart. We now offer e-Visits for anyone 8 and older to request care online for non-urgent symptoms. For details visit [email or web address].   Also download the MyChart app! Go to the app store, search "MyChart", open the app, select [facility], and log in with your MyChart username and password.  Masks are optional in the cancer centers. If you would like for your care team to wear a mask while they are taking care of you, please let them know. For doctor visits, patients may have with them one support person who is at least 48 years old. At this time, visitors are not allowed in the infusion area.

## 2022-07-24 ENCOUNTER — Encounter: Payer: Self-pay | Admitting: Internal Medicine

## 2022-07-24 ENCOUNTER — Ambulatory Visit: Payer: Managed Care, Other (non HMO) | Admitting: Internal Medicine

## 2022-07-24 VITALS — BP 132/86 | HR 83 | Temp 98.7°F | Ht 63.0 in | Wt 253.2 lb

## 2022-07-24 DIAGNOSIS — K760 Fatty (change of) liver, not elsewhere classified: Secondary | ICD-10-CM | POA: Diagnosis not present

## 2022-07-24 DIAGNOSIS — E1169 Type 2 diabetes mellitus with other specified complication: Secondary | ICD-10-CM | POA: Diagnosis not present

## 2022-07-24 DIAGNOSIS — E669 Obesity, unspecified: Secondary | ICD-10-CM

## 2022-07-24 DIAGNOSIS — D500 Iron deficiency anemia secondary to blood loss (chronic): Secondary | ICD-10-CM

## 2022-07-24 DIAGNOSIS — I10 Essential (primary) hypertension: Secondary | ICD-10-CM

## 2022-07-24 DIAGNOSIS — R600 Localized edema: Secondary | ICD-10-CM

## 2022-07-24 LAB — LDL CHOLESTEROL, DIRECT: Direct LDL: 102 mg/dL

## 2022-07-24 LAB — COMPREHENSIVE METABOLIC PANEL
ALT: 13 U/L (ref 0–35)
AST: 12 U/L (ref 0–37)
Albumin: 4.2 g/dL (ref 3.5–5.2)
Alkaline Phosphatase: 64 U/L (ref 39–117)
BUN: 10 mg/dL (ref 6–23)
CO2: 25 mEq/L (ref 19–32)
Calcium: 9.7 mg/dL (ref 8.4–10.5)
Chloride: 105 mEq/L (ref 96–112)
Creatinine, Ser: 0.81 mg/dL (ref 0.40–1.20)
GFR: 85.63 mL/min (ref >60.00)
Glucose, Bld: 104 mg/dL — ABNORMAL HIGH (ref 70–99)
Potassium: 4.1 mEq/L (ref 3.5–5.1)
Sodium: 135 mEq/L (ref 135–145)
Total Bilirubin: 0.4 mg/dL (ref 0.2–1.2)
Total Protein: 7.3 g/dL (ref 6.0–8.3)

## 2022-07-24 LAB — HEMOGLOBIN A1C: Hgb A1c MFr Bld: 5.3 % (ref 4.6–6.5)

## 2022-07-24 LAB — LIPID PANEL
Cholesterol: 163 mg/dL (ref 0–200)
HDL: 47.9 mg/dL (ref >39.00)
LDL Cholesterol: 101 mg/dL — ABNORMAL HIGH (ref 0–99)
NonHDL: 115.58
Total CHOL/HDL Ratio: 3
Triglycerides: 71 mg/dL (ref 0.0–149.0)
VLDL: 14.2 mg/dL (ref 0.0–40.0)

## 2022-07-24 NOTE — Progress Notes (Signed)
Subjective:  Patient ID: Nicole Burns, female    DOB: September 26, 1975  Age: 47 y.o. MRN: [id number]  CC: The primary encounter diagnosis was Essential hypertension, benign. Diagnoses of Diabetes mellitus type 2 in obese (HCC), Iron deficiency anemia due to chronic blood loss, Hepatic steatosis, Lower extremity edema, and Obesity, morbid ([facility]) were also pertinent to this visit.   HPI Nicole Burns presents for  Chief Complaint  Patient presents with   Medical Management of Chronic Issues    Follow up on diabetes   1) obesity, DM, HTN:  she has been taking ozempic since 2022 with an initial weight loss of 10 lbs, (starting weight 261)  but after reaching a nadir of 235 lbs in May 2023 , has been gaining weight and has  had a weight gain of 8 lbs since September.  BMI now 44.  She reduced her dose of Ozempic due to recurrent constipation and hypoglycemic symptoms; her  dose of ozempic is  now between 0. 25  and 0.5 mg  weekly.  She has not been exercising  regularly but has made a comittment to start walking and carrying  3 lb weights  daily. She feels more committed to exercise now since her husband has been supportive by agreeing to walk with her.  She is frustrated by her inability to lose weight and notes that her  appetite is reduced for meals,  but caloric intake is increased because she snacks all day long  on crackers,  nabs,  bag of chips., anything salty. She has  limited diet to chicken , fish, [place],  no red meat.  And uses Coconut oil,  coco amino acids  due to multiple food intolerances .  She continues to decline statin therapy   Defers flu vaccine and PNA vaccines,  had the first 2 COVID vaccines . Tdap is due oct 2024   2) IDA:  secondary to menorrhagia  she is again receiving iron infusions  last one Dec 11 .  Feels better after the infusions for several weeks (had 3 in December,  but none  October or November)   3) HTN:  BP control has  improved on max dose amlodipine   taking   spironolactone     Outpatient Medications Prior to Visit  Medication Sig Dispense Refill   ALPRAZolam (XANAX) 0.25 MG tablet Take 1 tablet (0.25 mg total) by mouth daily as needed for anxiety. 20 tablet 0   amLODipine (NORVASC) 10 MG tablet TAKE 1 TABLET BY MOUTH EVERY DAY 90 tablet 3   furosemide (LASIX) 20 MG tablet TAKE ONE TAB DAILY AS NEEDED FOR WEIGHT GAIN OR SWELLING 30 tablet 6   glucose blood test strip Use to check blood sugars twice daily 200 each 3   HOMEOPATHIC PRODUCTS PO Take by mouth. [employer] Blood Builder - vitamin C 15 mg, folic acid 350 mcg, Vitamin B12 30 mcg, Iron 26 mg     ondansetron (ZOFRAN) 4 MG tablet Take 1 tablet (4 mg total) by mouth every 8 (eight) hours as needed for nausea or vomiting. 30 tablet 1   OZEMPIC, 0.25 OR 0.5 MG/DOSE, 2 MG/3ML SOPN Inject 0.5 mg into the skin once a week. 3 mL 2   spironolactone (ALDACTONE) 25 MG tablet Take 1 tablet (25 mg total) by mouth daily. 90 tablet 1   Semaglutide,0.25 or 0.'5MG'$ /DOS, (OZEMPIC, 0.25 OR 0.5 MG/DOSE,) 2 MG/1.5ML SOPN Inject 0.5 mg into the skin once a week. 1.5 mL 2   No  facility-administered medications prior to visit.    Review of Systems;  Patient denies headache, fevers, malaise, unintentional weight loss, skin rash, eye pain, sinus congestion and sinus pain, sore throat, dysphagia,  hemoptysis , cough, dyspnea, wheezing, chest pain, palpitations, orthopnea, edema, abdominal pain, nausea, melena, diarrhea, constipation, flank pain, dysuria, hematuria, urinary  Frequency, nocturia, numbness, tingling, seizures,  Focal weakness, Loss of consciousness,  Tremor, insomnia, depression, anxiety, and suicidal ideation.      Objective:  BP 132/86   Pulse 83   Temp 98.7 F (37.1 C) (Oral)   Ht '5\' 3"'$  (1.6 m)   Wt 253 lb 3.2 oz (114.9 kg)   SpO2 97%   BMI 44.85 kg/m   BP Readings from Last 3 Encounters:  07/24/22 132/86  07/07/22 (!) 146/70  06/30/22 (!) 146/85    Wt Readings from Last 3 Encounters:   07/24/22 253 lb 3.2 oz (114.9 kg)  04/15/22 245 lb 3.2 oz (111.2 kg)  04/01/22 247 lb (112 kg)    Physical Exam Vitals reviewed.  Constitutional:      General: She is not in acute distress.    Appearance: Normal appearance. She is normal weight. She is not ill-appearing, toxic-appearing or diaphoretic.  HENT:     Head: Normocephalic.  Eyes:     General: No scleral icterus.       Right eye: No discharge.        Left eye: No discharge.     Conjunctiva/sclera: Conjunctivae normal.  Cardiovascular:     Rate and Rhythm: Normal rate and regular rhythm.     Heart sounds: Normal heart sounds.  Pulmonary:     Effort: Pulmonary effort is normal. No respiratory distress.     Breath sounds: Normal breath sounds.  Musculoskeletal:        General: Normal range of motion.  Skin:    General: Skin is warm and dry.  Neurological:     General: No focal deficit present.     Mental Status: She is alert and oriented to person, place, and time. Mental status is at baseline.  Psychiatric:        Mood and Affect: Mood normal.        Behavior: Behavior normal.        Thought Content: Thought content normal.        Judgment: Judgment normal.     Lab Results  Component Value Date   HGBA1C 5.3 07/24/2022   HGBA1C 5.4 04/15/2022   HGBA1C 5.4 11/15/2021    Lab Results  Component Value Date   CREATININE 0.81 07/24/2022   CREATININE 0.92 04/15/2022   CREATININE 0.90 11/15/2021    Lab Results  Component Value Date   WBC 9.0 06/30/2022   HGB 9.5 (L) 06/30/2022   HCT 33.2 (L) 06/30/2022   PLT 388 06/30/2022   GLUCOSE 104 (H) 07/24/2022   CHOL 163 07/24/2022   TRIG 71.0 07/24/2022   HDL 47.90 07/24/2022   LDLDIRECT 102.0 07/24/2022   LDLCALC 101 (H) 07/24/2022   ALT 13 07/24/2022   AST 12 07/24/2022   NA 135 07/24/2022   K 4.1 07/24/2022   CL 105 07/24/2022   CREATININE 0.81 07/24/2022   BUN 10 07/24/2022   CO2 25 07/24/2022   TSH 2.05 08/18/2019   HGBA1C 5.3 07/24/2022    MICROALBUR 0.8 04/15/2022    No results found.  Assessment & Plan:  .Essential hypertension, benign Assessment & Plan: Did not tolerate olmesartan due to nausea and diarrhea. Had muscle  cramps with hctz.    BP at goal on spironlactone and  Amlodipine.    Lab Results  Component Value Date   CREATININE 0.81 07/24/2022   Lab Results  Component Value Date   NA 135 07/24/2022   K 4.1 07/24/2022   CL 105 07/24/2022   CO2 25 07/24/2022   Lab Results  Component Value Date   MICROALBUR 0.8 04/15/2022   MICROALBUR 1.0 05/20/2021      Orders: -     Comprehensive metabolic panel  Diabetes mellitus type 2 in obese [facility]) Assessment & Plan: With fatty liver, obesity  and hypertension . Her a1c has normalized but she has gained weight due to  snacking and lack of exercise . No changes to regimen of ozempic,;   encouraged to start exercising and consider statin therapy  Lab Results  Component Value Date   HGBA1C 5.3 07/24/2022   Lab Results  Component Value Date   MICROALBUR 0.8 04/15/2022   MICROALBUR 1.0 05/20/2021      Lab Results  Component Value Date   CHOL 163 07/24/2022   HDL 47.90 07/24/2022   LDLCALC 101 (H) 07/24/2022   LDLDIRECT 102.0 07/24/2022   TRIG 71.0 07/24/2022   CHOLHDL 3 07/24/2022     Orders: -     Comprehensive metabolic panel -     Hemoglobin A1c -     Lipid panel -     LDL cholesterol, direct  Iron deficiency anemia due to chronic blood loss Assessment & Plan: Asymptomatic,  persistent, secondary to menorrhagia  from fibroid uterus.  She is receiving iron infusions at the [facility].   Lab Results  Component Value Date   IRON 36 06/30/2022   TIBC 377 06/30/2022   FERRITIN 56 06/30/2022   Lab Results  Component Value Date   WBC 9.0 06/30/2022   HGB 9.5 (L) 06/30/2022   HCT 33.2 (L) 06/30/2022   MCV 80.8 06/30/2022   PLT 388 06/30/2022      Hepatic steatosis Assessment & Plan: Current liver enzymes are normal and all  modifiable risk factors including obesity,  diabetes and hyperlipidemia have been addressed .  She has deferred statin therapy and has received vaccines for hepatitis A and B in the past.   Lab Results  Component Value Date   ALT 13 07/24/2022   AST 12 07/24/2022   ALKPHOS 64 07/24/2022   BILITOT 0.4 07/24/2022      Lower extremity edema Assessment & Plan: Stable on spironolactone .   Renal function and electrolytes are stable.   Obesity, morbid [facility]) Assessment & Plan: With fatty liver, diabetes and hypertension .  Her initial weight loss with Ozempic was encouraged,  but she is unable to tolerate more than 0.37 mg weekly dose due to constipation  and is snacking  on salty, starchy foods.  .  encoraged to start exercising ,  Dietary changes reviewed.       I provided 30 minutes of face-to-face time during this encounter reviewing patient's last visit with me, patient's  weight trajectory, dietary habits,  recent surgical and non surgical procedures, previous  labs and imaging studies, counseling on currently addressed issues,  and post visit ordering to diagnostics and therapeutics .   Follow-up: Return in about 3 months (around 10/23/2022) for follow up diabetes.   [name], MD

## 2022-07-26 NOTE — Assessment & Plan Note (Signed)
Stable on spironolactone .   Renal function and electrolytes are stable.

## 2022-07-26 NOTE — Assessment & Plan Note (Signed)
Asymptomatic,  persistent, secondary to menorrhagia  from fibroid uterus.  She is receiving iron infusions at the [facility].   Lab Results  Component Value Date   IRON 36 06/30/2022   TIBC 377 06/30/2022   FERRITIN 56 06/30/2022   Lab Results  Component Value Date   WBC 9.0 06/30/2022   HGB 9.5 (L) 06/30/2022   HCT 33.2 (L) 06/30/2022   MCV 80.8 06/30/2022   PLT 388 06/30/2022

## 2022-07-26 NOTE — Assessment & Plan Note (Signed)
With fatty liver, obesity  and hypertension . Her a1c has normalized but she has gained weight due to  snacking and lack of exercise . No changes to regimen of ozempic,;   encouraged to start exercising and consider statin therapy  Lab Results  Component Value Date   HGBA1C 5.3 07/24/2022   Lab Results  Component Value Date   MICROALBUR 0.8 04/15/2022   MICROALBUR 1.0 05/20/2021      Lab Results  Component Value Date   CHOL 163 07/24/2022   HDL 47.90 07/24/2022   LDLCALC 101 (H) 07/24/2022   LDLDIRECT 102.0 07/24/2022   TRIG 71.0 07/24/2022   CHOLHDL 3 07/24/2022

## 2022-07-26 NOTE — Assessment & Plan Note (Signed)
Did not tolerate olmesartan due to nausea and diarrhea. Had muscle cramps with hctz.    BP at goal on spironlactone and  Amlodipine.    Lab Results  Component Value Date   CREATININE 0.81 07/24/2022   Lab Results  Component Value Date   NA 135 07/24/2022   K 4.1 07/24/2022   CL 105 07/24/2022   CO2 25 07/24/2022   Lab Results  Component Value Date   MICROALBUR 0.8 04/15/2022   MICROALBUR 1.0 05/20/2021

## 2022-07-26 NOTE — Assessment & Plan Note (Signed)
Current liver enzymes are normal and all modifiable risk factors including obesity,  diabetes and hyperlipidemia have been addressed .  She has deferred statin therapy and has received vaccines for hepatitis A and B in the past.   Lab Results  Component Value Date   ALT 13 07/24/2022   AST 12 07/24/2022   ALKPHOS 64 07/24/2022   BILITOT 0.4 07/24/2022

## 2022-07-26 NOTE — Assessment & Plan Note (Signed)
With fatty liver, diabetes and hypertension .  Her initial weight loss with Ozempic was encouraged,  but she is unable to tolerate more than 0.37 mg weekly dose due to constipation  and is snacking  on salty, starchy foods.  .  encoraged to start exercising ,  Dietary changes reviewed.

## 2022-08-01 ENCOUNTER — Inpatient Hospital Stay: Payer: Managed Care, Other (non HMO)

## 2022-08-04 ENCOUNTER — Other Ambulatory Visit: Payer: Self-pay

## 2022-08-04 ENCOUNTER — Inpatient Hospital Stay: Payer: Managed Care, Other (non HMO) | Attending: Oncology

## 2022-08-04 ENCOUNTER — Telehealth: Payer: Self-pay | Admitting: Oncology

## 2022-08-04 DIAGNOSIS — I10 Essential (primary) hypertension: Secondary | ICD-10-CM | POA: Insufficient documentation

## 2022-08-04 DIAGNOSIS — Z803 Family history of malignant neoplasm of breast: Secondary | ICD-10-CM | POA: Diagnosis not present

## 2022-08-04 DIAGNOSIS — E119 Type 2 diabetes mellitus without complications: Secondary | ICD-10-CM | POA: Diagnosis not present

## 2022-08-04 DIAGNOSIS — N920 Excessive and frequent menstruation with regular cycle: Secondary | ICD-10-CM | POA: Diagnosis not present

## 2022-08-04 DIAGNOSIS — D500 Iron deficiency anemia secondary to blood loss (chronic): Secondary | ICD-10-CM | POA: Insufficient documentation

## 2022-08-04 LAB — CBC WITH DIFFERENTIAL/PLATELET
Abs Immature Granulocytes: 0.02 10*3/uL (ref 0.00–0.07)
Basophils Absolute: 0.1 10*3/uL (ref 0.0–0.1)
Basophils Relative: 1 %
Eosinophils Absolute: 0.3 10*3/uL (ref 0.0–0.5)
Eosinophils Relative: 3 %
HCT: 33.9 % — ABNORMAL LOW (ref 36.0–46.0)
Hemoglobin: 10.1 g/dL — ABNORMAL LOW (ref 12.0–15.0)
Immature Granulocytes: 0 %
Lymphocytes Relative: 25 %
Lymphs Abs: 1.8 10*3/uL (ref 0.7–4.0)
MCH: 25.3 pg — ABNORMAL LOW (ref 26.0–34.0)
MCHC: 29.8 g/dL — ABNORMAL LOW (ref 30.0–36.0)
MCV: 84.8 fL (ref 80.0–100.0)
Monocytes Absolute: 0.8 10*3/uL (ref 0.1–1.0)
Monocytes Relative: 10 %
Neutro Abs: 4.4 10*3/uL (ref 1.7–7.7)
Neutrophils Relative %: 61 %
Platelets: 292 10*3/uL (ref 150–400)
RBC: 4 MIL/uL (ref 3.87–5.11)
RDW: 19.2 % — ABNORMAL HIGH (ref 11.5–15.5)
WBC: 7.3 10*3/uL (ref 4.0–10.5)
nRBC: 0 % (ref 0.0–0.2)

## 2022-08-04 LAB — IRON AND TIBC
Iron: 29 ug/dL (ref 28–170)
Saturation Ratios: 8 % — ABNORMAL LOW (ref 10.4–31.8)
TIBC: 363 ug/dL (ref 250–450)
UIBC: 334 ug/dL

## 2022-08-04 LAB — FERRITIN: Ferritin: 19 ng/mL (ref 11–307)

## 2022-08-04 NOTE — Telephone Encounter (Signed)
Called pt to notify that MD encounter moved 1/18 130 LVM with apt info. Will r/s INF based on visit LOS

## 2022-08-05 ENCOUNTER — Inpatient Hospital Stay: Payer: Managed Care, Other (non HMO)

## 2022-08-05 ENCOUNTER — Inpatient Hospital Stay: Payer: Managed Care, Other (non HMO) | Admitting: Oncology

## 2022-08-06 MED FILL — Iron Sucrose Inj 20 MG/ML (Fe Equiv): INTRAVENOUS | Qty: 10 | Status: AC

## 2022-08-07 ENCOUNTER — Encounter: Payer: Self-pay | Admitting: Oncology

## 2022-08-07 ENCOUNTER — Inpatient Hospital Stay (HOSPITAL_BASED_OUTPATIENT_CLINIC_OR_DEPARTMENT_OTHER): Payer: Managed Care, Other (non HMO) | Admitting: Oncology

## 2022-08-07 VITALS — BP 153/94 | HR 74 | Temp 97.3°F | Resp 16 | Wt 254.1 lb

## 2022-08-07 DIAGNOSIS — N921 Excessive and frequent menstruation with irregular cycle: Secondary | ICD-10-CM

## 2022-08-07 DIAGNOSIS — D500 Iron deficiency anemia secondary to blood loss (chronic): Secondary | ICD-10-CM

## 2022-08-07 NOTE — Assessment & Plan Note (Signed)
follow-up with gynecology.

## 2022-08-07 NOTE — Progress Notes (Signed)
Patient denies new problems/concerns today.   °

## 2022-08-07 NOTE — Progress Notes (Signed)
Hematology/Oncology Progress note [phone] [phone]            Patient Care Team: [name], MD as PCP - General (Internal Medicine) Christene Lye, MD (General Surgery) Earlie Server, MD as Consulting Physician (Oncology)  CHIEF COMPLAINTS/REASON FOR VISIT:  iron deficiency anemia  ASSESSMENT & PLAN:   Iron deficiency anemia due to chronic blood loss #Iron deficiency anemia due to chronic blood loss. Labs reviewed and discussed with patient. Hemoglobin and iron panel have both improved, although still iron deficient.  Recommend additional IV Venofer weekly x4.   Menorrhagia follow-up with gynecology.    Orders Placed This Encounter  Procedures   CBC with Differential/Platelet    Standing Status:   Future    Standing Expiration Date:   08/08/2023   Ferritin    Standing Status:   Future    Standing Expiration Date:   08/08/2023   Iron and TIBC    Standing Status:   Future    Standing Expiration Date:   08/08/2023   Pregnancy, urine    Standing Status:   Standing    Number of Occurrences:   5    Standing Expiration Date:   08/08/2023   Follow up in 3 months.  All questions were answered. The patient knows to call the clinic with any problems, questions or concerns.  Earlie Server, MD, PhD [facility] Hematology Oncology 08/07/2022   HISTORY OF PRESENTING ILLNESS:   Nicole Burns is a  47 y.o.  female presents for follow up of iron deficiency anemia  11/15/2021, patient had a CBC done which showed a hemoglobin of 7.9, MCV 70.8.  Normal white count and platelet count. Iron panel showed iron saturation of 4, ferritin of 4. Reviewed patient's previous lab records, patient has chronic anemia, dated back at least to some protein. She has heavy menstrual period patient follows up with gynecology and has a pelvic ultrasound scheduled for further work-up Patient reports feeling tired sometimes.  Denies any black or bloody stool.   Colonoscopy is up-to-date.  INTERVAL HISTORY Nicole Burns is a 47 y.o. female who has above history reviewed by me today presents for follow up visit of IDA  Patient tolerates IV Venofer treatments.  Fatigue has improved. Continues to have heavy menstrual period.  Review of Systems  Constitutional:  Positive for fatigue. Negative for appetite change, chills and fever.  HENT:   Negative for hearing loss and voice change.   Eyes:  Negative for eye problems.  Respiratory:  Negative for chest tightness and cough.   Cardiovascular:  Negative for chest pain.  Gastrointestinal:  Negative for abdominal distention, abdominal pain and blood in stool.  Endocrine: Negative for hot flashes.  Genitourinary:  Positive for menstrual problem. Negative for difficulty urinating and frequency.   Musculoskeletal:  Negative for arthralgias.  Skin:  Negative for itching and rash.  Neurological:  Negative for extremity weakness.  Hematological:  Negative for adenopathy.  Psychiatric/Behavioral:  Negative for confusion.     MEDICAL HISTORY:  Past Medical History:  Diagnosis Date   Abortion, missed 08/09/2015   Diabetes mellitus without complication ([place])    Fatty liver    Gallbladder polyp    Hepatic cyst    History of chicken pox    Hypertension    Lower extremity edema 09/10/2021   Migraine    After menses cycle.    Pure hypercholesterolemia 03/21/5175   Umbilical hernia 1607    SURGICAL HISTORY: Past Surgical History:  Procedure  Laterality Date   CESAREAN SECTION     1993 & 2003   COLONOSCOPY WITH PROPOFOL N/A 04/26/2021   Procedure: COLONOSCOPY WITH PROPOFOL;  Surgeon: Virgel Manifold, MD;  Location: [facility];  Service: Endoscopy;  Laterality: N/A;   DILATION AND EVACUATION N/A 08/01/2015   Procedure: DILATATION AND EVACUATION With Tissue Sent For Chromosomal Analysis;  Surgeon: Servando Salina, MD;  Location: [facility];  Service: Gynecology;  Laterality: N/A;   HERNIA REPAIR   2007   OPERATIVE ULTRASOUND N/A 08/01/2015   Procedure: OPERATIVE ULTRASOUND;  Surgeon: Servando Salina, MD;  Location: [facility];  Service: Gynecology;  Laterality: N/A;    SOCIAL HISTORY: Social History   Socioeconomic History   Marital status: Married    Spouse name: Not on file   Number of children: Not on file   Years of education: Not on file   Highest education level: Not on file  Occupational History   Not on file  Tobacco Use   Smoking status: Never   Smokeless tobacco: Never  Vaping Use   Vaping Use: Never used  Substance and Sexual Activity   Alcohol use: No   Drug use: No   Sexual activity: Yes  Other Topics Concern   Not on file  Social History Narrative   Not on file   Social Determinants of Health   Financial Resource Strain: Low Risk  (09/10/2021)   Overall Financial Resource Strain (CARDIA)    Difficulty of Paying Living Expenses: Not hard at all  Food Insecurity: No Food Insecurity (09/10/2021)   Hunger Vital Sign    Worried About Running Out of Food in the Last Year: Never true    [place] in the Last Year: Never true  Transportation Needs: No Transportation Needs (09/10/2021)   PRAPARE - Hydrologist (Medical): No    Lack of Transportation (Non-Medical): No  Physical Activity: Inactive (09/10/2021)   Exercise Vital Sign    Days of Exercise per Week: 0 days    Minutes of Exercise per Session: 0 min  Stress: Not on file  Social Connections: Not on file  Intimate Partner Violence: Not on file    FAMILY HISTORY: Family History  Problem Relation Age of Onset   Hypertension Mother    Hyperlipidemia Father    Heart disease Father    Stroke Father    Diabetes Father    Hypertension Father    Hypertension Sister    Hyperlipidemia Maternal Grandmother    Cancer Maternal Grandmother        Breast   Breast cancer Maternal Grandmother    Kidney disease Paternal Grandmother    Diabetes Paternal Grandmother      ALLERGIES:  is allergic to lisinopril, olmesartan, maxzide [hydrochlorothiazide w-triamterene], and vicodin [hydrocodone-acetaminophen].  MEDICATIONS:  Current Outpatient Medications  Medication Sig Dispense Refill   ALPRAZolam (XANAX) 0.25 MG tablet Take 1 tablet (0.25 mg total) by mouth daily as needed for anxiety. 20 tablet 0   amLODipine (NORVASC) 10 MG tablet TAKE 1 TABLET BY MOUTH EVERY DAY 90 tablet 3   furosemide (LASIX) 20 MG tablet TAKE ONE TAB DAILY AS NEEDED FOR WEIGHT GAIN OR SWELLING 30 tablet 6   glucose blood test strip Use to check blood sugars twice daily 200 each 3   HOMEOPATHIC PRODUCTS PO Take by mouth. MegaFood Blood Builder - vitamin C 15 mg, folic acid 837 mcg, Vitamin B12 30 mcg, Iron 26 mg     ondansetron (  ZOFRAN) 4 MG tablet Take 1 tablet (4 mg total) by mouth every 8 (eight) hours as needed for nausea or vomiting. 30 tablet 1   OZEMPIC, 0.25 OR 0.5 MG/DOSE, 2 MG/3ML SOPN Inject 0.5 mg into the skin once a week. 3 mL 2   spironolactone (ALDACTONE) 25 MG tablet Take 1 tablet (25 mg total) by mouth daily. 90 tablet 1   No current facility-administered medications for this visit.     PHYSICAL EXAMINATION: Vitals:   08/07/22 1300  BP: (!) 153/94  Pulse: 74  Resp: 16  Temp: (!) 97.3 F (36.3 C)   Filed Weights   08/07/22 1300  Weight: 254 lb 1.6 oz (115.3 kg)    Physical Exam Constitutional:      General: She is not in acute distress. HENT:     Head: Normocephalic.  Eyes:     General: No scleral icterus. Cardiovascular:     Rate and Rhythm: Normal rate.     Heart sounds: Normal heart sounds.  Pulmonary:     Effort: Pulmonary effort is normal. No respiratory distress.     Breath sounds: No wheezing.  Abdominal:     General: Bowel sounds are normal. There is no distension.     Palpations: Abdomen is soft.  Musculoskeletal:        General: No deformity. Normal range of motion.     Cervical back: Normal range of motion.  Skin:    General:  Skin is warm and dry.     Findings: No erythema.  Neurological:     Mental Status: She is alert and oriented to person, place, and time. Mental status is at baseline.     Cranial Nerves: No cranial nerve deficit.  Psychiatric:        Mood and Affect: Mood normal.     LABORATORY DATA:  I have reviewed the data as listed    Latest Ref Rng & Units 08/04/2022   10:46 AM 06/30/2022   12:55 PM 03/03/2022    9:35 AM  CBC  WBC 4.0 - 10.5 K/uL 7.3  9.0  7.1   Hemoglobin 12.0 - 15.0 g/dL 10.1  9.5  9.5   Hematocrit 36.0 - 46.0 % 33.9  33.2  32.0   Platelets 150 - 400 K/uL 292  388  339       Latest Ref Rng & Units 07/24/2022   10:45 AM 04/15/2022    4:11 PM 11/15/2021    8:59 AM  CMP  Glucose 70 - 99 mg/dL 104  90  88   BUN 6 - 23 mg/dL '10  11  12   '$ Creatinine 0.40 - 1.20 mg/dL 0.81  0.92  0.90   Sodium 135 - 145 mEq/L 135  135  136   Potassium 3.5 - 5.1 mEq/L 4.1  3.6  3.8   Chloride 96 - 112 mEq/L 105  104  106   CO2 19 - 32 mEq/L '25  24  25   '$ Calcium 8.4 - 10.5 mg/dL 9.7  9.6  9.3   Total Protein 6.0 - 8.3 g/dL 7.3  7.9  7.1   Total Bilirubin 0.2 - 1.2 mg/dL 0.4  0.5  0.4   Alkaline Phos 39 - 117 U/L 64  65  55   AST 0 - 37 U/L '12  13  12   '$ ALT 0 - 35 U/L '13  12  10      '$ Iron/TIBC/Ferritin/ %Sat    Component Value Date/Time  IRON 29 08/04/2022 1045   TIBC 363 08/04/2022 1045   FERRITIN 19 08/04/2022 1046   IRONPCTSAT 8 (L) 08/04/2022 1045   IRONPCTSAT 4 (L) 11/15/2021 0859      RADIOGRAPHIC STUDIES: I have personally reviewed the radiological images as listed and agreed with the findings in the report. No results found.

## 2022-08-07 NOTE — Assessment & Plan Note (Signed)
#  Iron deficiency anemia due to chronic blood loss. Labs reviewed and discussed with patient. Hemoglobin and iron panel have both improved, although still iron deficient.  Recommend additional IV Venofer weekly x4.

## 2022-08-20 ENCOUNTER — Inpatient Hospital Stay: Payer: Managed Care, Other (non HMO)

## 2022-08-20 VITALS — BP 139/88 | HR 66 | Temp 97.2°F | Resp 18

## 2022-08-20 DIAGNOSIS — D500 Iron deficiency anemia secondary to blood loss (chronic): Secondary | ICD-10-CM

## 2022-08-20 LAB — PREGNANCY, URINE: Preg Test, Ur: NEGATIVE

## 2022-08-20 MED ORDER — SODIUM CHLORIDE 0.9 % IV SOLN
200.0000 mg | Freq: Once | INTRAVENOUS | Status: AC
  Administered 2022-08-20: 200 mg via INTRAVENOUS
  Filled 2022-08-20: qty 200

## 2022-08-20 MED ORDER — SODIUM CHLORIDE 0.9 % IV SOLN
INTRAVENOUS | Status: DC | PRN
  Filled 2022-08-20: qty 250

## 2022-08-20 NOTE — Progress Notes (Signed)
Patient declined to wait the 30 minutes after the iron for post infusion monitoring.

## 2022-08-26 MED FILL — Iron Sucrose Inj 20 MG/ML (Fe Equiv): INTRAVENOUS | Qty: 10 | Status: AC

## 2022-08-27 ENCOUNTER — Inpatient Hospital Stay: Payer: Managed Care, Other (non HMO) | Attending: Oncology

## 2022-08-27 ENCOUNTER — Inpatient Hospital Stay: Payer: Managed Care, Other (non HMO)

## 2022-08-27 VITALS — BP 123/82 | HR 75 | Temp 98.8°F | Resp 16

## 2022-08-27 DIAGNOSIS — D500 Iron deficiency anemia secondary to blood loss (chronic): Secondary | ICD-10-CM | POA: Diagnosis present

## 2022-08-27 DIAGNOSIS — N920 Excessive and frequent menstruation with regular cycle: Secondary | ICD-10-CM | POA: Insufficient documentation

## 2022-08-27 MED ORDER — SODIUM CHLORIDE 0.9 % IV SOLN
INTRAVENOUS | Status: DC
  Filled 2022-08-27: qty 250

## 2022-08-27 MED ORDER — SODIUM CHLORIDE 0.9 % IV SOLN
200.0000 mg | Freq: Once | INTRAVENOUS | Status: AC
  Administered 2022-08-27: 200 mg via INTRAVENOUS
  Filled 2022-08-27: qty 200

## 2022-08-27 NOTE — Progress Notes (Signed)
Pt refusing urine pregnancy test.  Okay to proceed per MD.   Pt refused 30 minute post observation period

## 2022-08-27 NOTE — Patient Instructions (Addendum)

## 2022-09-02 MED FILL — Iron Sucrose Inj 20 MG/ML (Fe Equiv): INTRAVENOUS | Qty: 10 | Status: AC

## 2022-09-03 ENCOUNTER — Inpatient Hospital Stay: Payer: Managed Care, Other (non HMO)

## 2022-09-03 VITALS — BP 143/74 | HR 77 | Temp 97.0°F | Resp 17

## 2022-09-03 DIAGNOSIS — D500 Iron deficiency anemia secondary to blood loss (chronic): Secondary | ICD-10-CM

## 2022-09-03 MED ORDER — SODIUM CHLORIDE 0.9 % IV SOLN
INTRAVENOUS | Status: DC
  Filled 2022-09-03: qty 250

## 2022-09-03 MED ORDER — SODIUM CHLORIDE 0.9 % IV SOLN
200.0000 mg | Freq: Once | INTRAVENOUS | Status: AC
  Administered 2022-09-03: 200 mg via INTRAVENOUS
  Filled 2022-09-03: qty 200

## 2022-09-03 MED ORDER — SODIUM CHLORIDE 0.9% FLUSH
10.0000 mL | Freq: Once | INTRAVENOUS | Status: AC | PRN
  Administered 2022-09-03: 10 mL
  Filled 2022-09-03: qty 10

## 2022-09-03 NOTE — Progress Notes (Signed)
Patient tolerated Venofer infusion well. Explained recommendation of 30 min post monitoring. Patient refused to wait post monitoring. Educated on what signs to watch for & to call with any concerns. No questions, discharged. Stable

## 2022-09-03 NOTE — Progress Notes (Signed)
Patient refused urine preg test, MD notifed

## 2022-09-03 NOTE — Patient Instructions (Signed)

## 2022-09-09 MED FILL — Iron Sucrose Inj 20 MG/ML (Fe Equiv): INTRAVENOUS | Qty: 10 | Status: AC

## 2022-09-10 ENCOUNTER — Inpatient Hospital Stay: Payer: Managed Care, Other (non HMO)

## 2022-09-10 VITALS — BP 141/79 | HR 66 | Temp 97.4°F | Resp 18

## 2022-09-10 DIAGNOSIS — D500 Iron deficiency anemia secondary to blood loss (chronic): Secondary | ICD-10-CM | POA: Diagnosis not present

## 2022-09-10 MED ORDER — SODIUM CHLORIDE 0.9 % IV SOLN
INTRAVENOUS | Status: DC | PRN
  Filled 2022-09-10: qty 250

## 2022-09-10 MED ORDER — SODIUM CHLORIDE 0.9 % IV SOLN
200.0000 mg | Freq: Once | INTRAVENOUS | Status: AC
  Administered 2022-09-10: 200 mg via INTRAVENOUS
  Filled 2022-09-10: qty 200

## 2022-09-10 NOTE — Progress Notes (Signed)
Patient refused urine pregnancy test today, Dr. Tasia Catchings aware.   Patient declined to wait the 30 minutes for post iron infusion observation today.  Post iron education done. Patient verbalized understanding.

## 2022-10-10 ENCOUNTER — Other Ambulatory Visit: Payer: Self-pay | Admitting: Cardiovascular Disease

## 2022-10-10 NOTE — Telephone Encounter (Signed)
Left message for patient to call and schedule overdue follow up with Dr. [place] / APP for medication refills 

## 2022-10-10 NOTE — Telephone Encounter (Signed)
Please call pt to schedule overdue 6 month follow-up appointment with Dr. Oval Linsey or APP. Pt last seen 08/2021. Thank you!

## 2022-10-13 ENCOUNTER — Encounter: Payer: Self-pay | Admitting: Pharmacist

## 2022-10-15 ENCOUNTER — Telehealth: Payer: Self-pay | Admitting: Cardiovascular Disease

## 2022-10-15 MED ORDER — AMLODIPINE BESYLATE 10 MG PO TABS: 10.0000 mg | ORAL_TABLET | Freq: Every day | ORAL | 0 refills | Status: DC

## 2022-10-15 NOTE — Telephone Encounter (Signed)
Left message for patient to call and schedule overdue follow up with Dr. [place]/App for medication refills

## 2022-10-15 NOTE — Telephone Encounter (Signed)
*  STAT* If patient is at the pharmacy, call can be transferred to refill team.   1. Which medications need to be refilled? (please list name of each medication and dose if known) amLODipine (NORVASC) 10 MG tablet   2. Which pharmacy/location (including street and city if local pharmacy) is medication to be sent to? [id number] - [address], [place]   3. Do they need a 30 day or 90 day supply? 90 day  Patient is completely out of medication. She has an appointment 12/01/2022.

## 2022-10-23 ENCOUNTER — Encounter: Payer: Self-pay | Admitting: Internal Medicine

## 2022-10-23 ENCOUNTER — Ambulatory Visit (INDEPENDENT_AMBULATORY_CARE_PROVIDER_SITE_OTHER): Payer: Managed Care, Other (non HMO) | Admitting: Internal Medicine

## 2022-10-23 ENCOUNTER — Encounter: Payer: Self-pay | Admitting: Oncology

## 2022-10-23 VITALS — BP 136/86 | HR 82 | Temp 98.3°F | Ht 63.0 in | Wt 254.2 lb

## 2022-10-23 DIAGNOSIS — I152 Hypertension secondary to endocrine disorders: Secondary | ICD-10-CM

## 2022-10-23 DIAGNOSIS — I10 Essential (primary) hypertension: Secondary | ICD-10-CM | POA: Diagnosis not present

## 2022-10-23 DIAGNOSIS — K760 Fatty (change of) liver, not elsewhere classified: Secondary | ICD-10-CM

## 2022-10-23 DIAGNOSIS — E7800 Pure hypercholesterolemia, unspecified: Secondary | ICD-10-CM

## 2022-10-23 DIAGNOSIS — E669 Obesity, unspecified: Secondary | ICD-10-CM

## 2022-10-23 DIAGNOSIS — Z114 Encounter for screening for human immunodeficiency virus [HIV]: Secondary | ICD-10-CM

## 2022-10-23 DIAGNOSIS — E1159 Type 2 diabetes mellitus with other circulatory complications: Secondary | ICD-10-CM

## 2022-10-23 DIAGNOSIS — E1169 Type 2 diabetes mellitus with other specified complication: Secondary | ICD-10-CM

## 2022-10-23 MED ORDER — SPIRONOLACTONE 25 MG PO TABS: 25.0000 mg | ORAL_TABLET | Freq: Every day | ORAL | 1 refills | Status: DC

## 2022-10-23 MED ORDER — TIRZEPATIDE 2.5 MG/0.5ML ~~LOC~~ SOAJ: 2.5000 mg | SUBCUTANEOUS | 2 refills | Status: DC

## 2022-10-23 NOTE — Assessment & Plan Note (Signed)
With fatty liver, diabetes and hypertension . She remains morbidly obese and Is  unable to tolerate more than 0.37 mg weekly dose due to constipation  and is snacking  on salty, starchy foods.  .  encoraged to start exercising ,  Dietary changes review she remains

## 2022-10-23 NOTE — Progress Notes (Signed)
Subjective:  Patient ID: Nicole Burns, female    DOB: 1975-08-12  Age: 47 y.o. MRN: [id number]  CC: The primary encounter diagnosis was Encounter for screening for HIV. Diagnoses of Essential hypertension, benign, Pure hypercholesterolemia, Obesity, morbid, Hepatic steatosis, and Obesity, diabetes, and hypertension syndrome were also pertinent to this visit.   HPI [name] presents for  Chief Complaint  Patient presents with   Medical Management of Chronic Issues    Obesity, diabetes hypertension :   weight unchanged from January .    still taking 0.375 mg weekly,  occasionally increases dose to 0.5 mg ,  but notes that she has a transient but recurrent  mood change a day or two later that lasts for 2-3 days.  Gets very tearful and sad.    Husband wants her to stop ozempic .  She is frustrated,   eating only twice daily. Her Sister is losing WEIGHT steadily,  recently started zepbound but also attending weight management  I [place] through [facility].    Patient is eating twice daily and interested in being   Anxiety:  improved with new job and steady income   HTN:  taking amlodipine 10 mg and spironolatone 25 mg . . Notes some fluid retention mid cycle.  Seeing HTN specialist next month .  Home readings 140/80 or less   Outpatient Medications Prior to Visit  Medication Sig Dispense Refill   ALPRAZolam (XANAX) 0.25 MG tablet Take 1 tablet (0.25 mg total) by mouth daily as needed for anxiety. 20 tablet 0   amLODipine (NORVASC) 10 MG tablet Take 1 tablet (10 mg total) by mouth daily. 45 tablet 0   furosemide (LASIX) 20 MG tablet TAKE ONE TAB DAILY AS NEEDED FOR WEIGHT GAIN OR SWELLING 30 tablet 6   glucose blood test strip Use to check blood sugars twice daily 200 each 3   HOMEOPATHIC PRODUCTS PO Take by mouth. Mega[employer] Blood Builder - vitamin C 15 mg, folic acid 400 mcg, Vitamin B12 30 mcg, Iron 26 mg     ondansetron (ZOFRAN) 4 MG tablet Take 1 tablet (4 mg total) by mouth every  8 (eight) hours as needed for nausea or vomiting. 30 tablet 1   OZEMPIC, 0.25 OR 0.5 MG/DOSE, 2 MG/3ML SOPN Inject 0.5 mg into the skin once a week. 3 mL 2   spironolactone (ALDACTONE) 25 MG tablet Take 1 tablet (25 mg total) by mouth daily. 90 tablet 1   No facility-administered medications prior to visit.    Review of Systems;  Patient denies headache, fevers, malaise, unintentional weight loss, skin rash, eye pain, sinus congestion and sinus pain, sore throat, dysphagia,  hemoptysis , cough, dyspnea, wheezing, chest pain, palpitations, orthopnea, edema, abdominal pain, nausea, melena, diarrhea, constipation, flank pain, dysuria, hematuria, urinary  Frequency, nocturia, numbness, tingling, seizures,  Focal weakness, Loss of consciousness,  Tremor, insomnia, depression, anxiety, and suicidal ideation.      Objective:  BP 136/86   Pulse 82   Temp 98.3 F (36.8 C) (Oral)   Ht 5\' 3"  (1.6 m)   Wt 254 lb 3.2 oz (115.3 kg)   LMP  (LMP Unknown)   SpO2 96%   BMI 45.03 kg/m   BP Readings from Last 3 Encounters:  10/23/22 136/86  09/10/22 (!) 141/79  09/03/22 (!) 143/74    Wt Readings from Last 3 Encounters:  10/23/22 254 lb 3.2 oz (115.3 kg)  08/07/22 254 lb 1.6 oz (115.3 kg)  07/24/22 253 lb 3.2 oz (114.9  kg)    Physical Exam Vitals reviewed.  Constitutional:      General: She is not in acute distress.    Appearance: Normal appearance. She is normal weight. She is not ill-appearing, toxic-appearing or diaphoretic.  HENT:     Head: Normocephalic.  Eyes:     General: No scleral icterus.       Right eye: No discharge.        Left eye: No discharge.     Conjunctiva/sclera: Conjunctivae normal.  Cardiovascular:     Rate and Rhythm: Normal rate and regular rhythm.     Heart sounds: Normal heart sounds.  Pulmonary:     Effort: Pulmonary effort is normal. No respiratory distress.     Breath sounds: Normal breath sounds.  Musculoskeletal:        General: Normal range of  motion.  Skin:    General: Skin is warm and dry.  Neurological:     General: No focal deficit present.     Mental Status: She is alert and oriented to person, place, and time. Mental status is at baseline.  Psychiatric:        Mood and Affect: Mood normal.        Behavior: Behavior normal.        Thought Content: Thought content normal.        Judgment: Judgment normal.    Lab Results  Component Value Date   HGBA1C 5.3 07/24/2022   HGBA1C 5.4 04/15/2022   HGBA1C 5.4 11/15/2021    Lab Results  Component Value Date   CREATININE 0.81 07/24/2022   CREATININE 0.92 04/15/2022   CREATININE 0.90 11/15/2021    Lab Results  Component Value Date   WBC 7.3 08/04/2022   HGB 10.1 (L) 08/04/2022   HCT 33.9 (L) 08/04/2022   PLT 292 08/04/2022   GLUCOSE 104 (H) 07/24/2022   CHOL 163 07/24/2022   TRIG 71.0 07/24/2022   HDL 47.90 07/24/2022   LDLDIRECT 102.0 07/24/2022   LDLCALC 101 (H) 07/24/2022   ALT 13 07/24/2022   AST 12 07/24/2022   NA 135 07/24/2022   K 4.1 07/24/2022   CL 105 07/24/2022   CREATININE 0.81 07/24/2022   BUN 10 07/24/2022   CO2 25 07/24/2022   TSH 2.05 08/18/2019   HGBA1C 5.3 07/24/2022   MICROALBUR 0.8 04/15/2022    No results found.  Assessment & Plan:  .Encounter for screening for HIV -     HIV Antibody (routine testing w rflx); Future  Essential hypertension, benign Assessment & Plan: Did not tolerate olmesartan due to nausea and diarrhea. Had muscle cramps with hctz.    BP at goal on spironoactone and  Amlodipine.    Lab Results  Component Value Date   CREATININE 0.81 07/24/2022   Lab Results  Component Value Date   NA 135 07/24/2022   K 4.1 07/24/2022   CL 105 07/24/2022   CO2 25 07/24/2022   Lab Results  Component Value Date   MICROALBUR 0.8 04/15/2022   MICROALBUR 1.0 05/20/2021      Orders: -     Comprehensive metabolic panel; Future  Pure hypercholesterolemia -     LDL cholesterol, direct; Future -     Hemoglobin A1c;  Future -     Lipid panel; Future  Obesity, morbid Assessment & Plan: With fatty liver, diabetes and hypertension . She remains morbidly obese and Is  unable to tolerate more than 0.37 mg weekly dose due to constipation  and is  snacking  on salty, starchy foods.  .  encoraged to start exercising ,  Dietary changes review she remains   Orders: -     Amb Ref to Medical Weight Management  Hepatic steatosis  Obesity, diabetes, and hypertension syndrome Assessment & Plan: She has been uanable to lose weight for various reasons , in spite of usuing a  GLP 1 agonist . eferring to [facility]   Orders: -     Amb Ref to Medical Weight Management  Other orders -     Spironolactone; Take 1 tablet (25 mg total) by mouth daily.  Dispense: 90 tablet; Refill: 1 -     Tirzepatide; Inject 2.5 mg into the skin once a week.  Dispense: 2 mL; Refill: 2    urology,  recent surgical and non surgical procedures, previous  labs and imaging studies, counseling on currently addressed issues,  and post visit ordering to diagnostics and therapeutics .   Follow-up: Return in about 6 months (around 04/24/2023).   Sherlene Shamseresa L Aiva Miskell, MD

## 2022-10-23 NOTE — Patient Instructions (Addendum)
I am Changing  your medication from ozempic to [facility].  If it is not covered, we will resume ozempic.    I have mae the referral to the [facility] clinic in [place]

## 2022-10-26 NOTE — Assessment & Plan Note (Signed)
She has been uanable to lose weight for various reasons , in spite of usuing a  GLP 1 agonist . eferring to [facility]

## 2022-10-26 NOTE — Assessment & Plan Note (Signed)
Did not tolerate olmesartan due to nausea and diarrhea. Had muscle cramps with hctz.    BP at goal on spironoactone and  Amlodipine.    Lab Results  Component Value Date   CREATININE 0.81 07/24/2022   Lab Results  Component Value Date   NA 135 07/24/2022   K 4.1 07/24/2022   CL 105 07/24/2022   CO2 25 07/24/2022   Lab Results  Component Value Date   MICROALBUR 0.8 04/15/2022   MICROALBUR 1.0 05/20/2021

## 2022-10-31 ENCOUNTER — Telehealth: Payer: Self-pay

## 2022-10-31 ENCOUNTER — Other Ambulatory Visit (HOSPITAL_COMMUNITY): Payer: Self-pay

## 2022-10-31 ENCOUNTER — Encounter: Payer: Self-pay | Admitting: Oncology

## 2022-10-31 NOTE — Telephone Encounter (Signed)
Pt is aware and gave a verbal understanding.  

## 2022-10-31 NOTE — Telephone Encounter (Signed)
Patient Advocate Encounter  Prior authorization for Mounjaro 2.5MG /0.5ML pen-injectors submitted and APPROVED on 10-30-2022.  Test billing returns $0.00 copay for 28 day supply.  Key [id number] Effective: 10-30-2022 - 10-31-2023

## 2022-11-06 ENCOUNTER — Inpatient Hospital Stay: Payer: Managed Care, Other (non HMO) | Attending: Oncology

## 2022-11-06 ENCOUNTER — Other Ambulatory Visit: Payer: Self-pay | Admitting: Internal Medicine

## 2022-11-06 DIAGNOSIS — N920 Excessive and frequent menstruation with regular cycle: Secondary | ICD-10-CM | POA: Insufficient documentation

## 2022-11-06 DIAGNOSIS — D500 Iron deficiency anemia secondary to blood loss (chronic): Secondary | ICD-10-CM | POA: Insufficient documentation

## 2022-11-06 DIAGNOSIS — E1169 Type 2 diabetes mellitus with other specified complication: Secondary | ICD-10-CM

## 2022-11-06 DIAGNOSIS — Z803 Family history of malignant neoplasm of breast: Secondary | ICD-10-CM | POA: Insufficient documentation

## 2022-11-07 MED FILL — Iron Sucrose Inj 20 MG/ML (Fe Equiv): INTRAVENOUS | Qty: 10 | Status: AC

## 2022-11-10 ENCOUNTER — Inpatient Hospital Stay: Payer: Managed Care, Other (non HMO)

## 2022-11-10 ENCOUNTER — Inpatient Hospital Stay (HOSPITAL_BASED_OUTPATIENT_CLINIC_OR_DEPARTMENT_OTHER): Payer: Managed Care, Other (non HMO) | Admitting: Oncology

## 2022-11-10 ENCOUNTER — Encounter: Payer: Self-pay | Admitting: Oncology

## 2022-11-10 VITALS — BP 142/68 | HR 83 | Temp 97.9°F | Resp 18 | Wt 260.3 lb

## 2022-11-10 DIAGNOSIS — N920 Excessive and frequent menstruation with regular cycle: Secondary | ICD-10-CM | POA: Diagnosis not present

## 2022-11-10 DIAGNOSIS — D500 Iron deficiency anemia secondary to blood loss (chronic): Secondary | ICD-10-CM | POA: Diagnosis present

## 2022-11-10 DIAGNOSIS — N921 Excessive and frequent menstruation with irregular cycle: Secondary | ICD-10-CM

## 2022-11-10 DIAGNOSIS — Z803 Family history of malignant neoplasm of breast: Secondary | ICD-10-CM | POA: Diagnosis not present

## 2022-11-10 LAB — CBC WITH DIFFERENTIAL/PLATELET
Abs Immature Granulocytes: 0.04 10*3/uL (ref 0.00–0.07)
Basophils Absolute: 0 10*3/uL (ref 0.0–0.1)
Basophils Relative: 1 %
Eosinophils Absolute: 0.2 10*3/uL (ref 0.0–0.5)
Eosinophils Relative: 2 %
HCT: 34.2 % — ABNORMAL LOW (ref 36.0–46.0)
Hemoglobin: 10.5 g/dL — ABNORMAL LOW (ref 12.0–15.0)
Immature Granulocytes: 1 %
Lymphocytes Relative: 23 %
Lymphs Abs: 2.1 10*3/uL (ref 0.7–4.0)
MCH: 27.2 pg (ref 26.0–34.0)
MCHC: 30.7 g/dL (ref 30.0–36.0)
MCV: 88.6 fL (ref 80.0–100.0)
Monocytes Absolute: 0.8 10*3/uL (ref 0.1–1.0)
Monocytes Relative: 9 %
Neutro Abs: 5.6 10*3/uL (ref 1.7–7.7)
Neutrophils Relative %: 64 %
Platelets: 298 10*3/uL (ref 150–400)
RBC: 3.86 MIL/uL — ABNORMAL LOW (ref 3.87–5.11)
RDW: 14.4 % (ref 11.5–15.5)
WBC: 8.8 10*3/uL (ref 4.0–10.5)
nRBC: 0 % (ref 0.0–0.2)

## 2022-11-10 LAB — IRON AND TIBC
Iron: 31 ug/dL (ref 28–170)
Saturation Ratios: 9 % — ABNORMAL LOW (ref 10.4–31.8)
TIBC: 347 ug/dL (ref 250–450)
UIBC: 316 ug/dL

## 2022-11-10 LAB — FERRITIN: Ferritin: 11 ng/mL (ref 11–307)

## 2022-11-10 NOTE — Progress Notes (Signed)
Hematology/Oncology Progress note [phone] [phone]            Patient Care Team: Sherlene Shams, MD as PCP - General (Internal Medicine) Kieth Brightly, MD (General Surgery) Rickard Patience, MD as Consulting Physician (Oncology)  CHIEF COMPLAINTS/REASON FOR VISIT:  iron deficiency anemia  ASSESSMENT & PLAN:   Iron deficiency anemia due to chronic blood loss #Iron deficiency anemia due to chronic blood loss. Labs reviewed and discussed with patient. Hemoglobin and iron panel have both improved, although still iron deficient.  Recommend additional IV Venofer weekly x4.   Menorrhagia follow-up with gynecology.    Orders Placed This Encounter  Procedures   CBC with Differential (Cancer Center Only)    Standing Status:   Future    Standing Expiration Date:   11/10/2023   Iron and TIBC    Standing Status:   Future    Standing Expiration Date:   11/10/2023   Ferritin    Standing Status:   Future    Standing Expiration Date:   11/10/2023   Retic Panel    Standing Status:   Future    Standing Expiration Date:   11/10/2023   Follow up in 6 months.  All questions were answered. The patient knows to call the clinic with any problems, questions or concerns.  Rickard Patience, MD, PhD [facility] Hematology Oncology 11/10/2022   HISTORY OF PRESENTING ILLNESS:   Nicole Burns is a  47 y.o.  female presents for follow up of iron deficiency anemia  11/15/2021, patient had a CBC done which showed a hemoglobin of 7.9, MCV 70.8.  Normal white count and platelet count. Iron panel showed iron saturation of 4, ferritin of 4. Reviewed patient's previous lab records, patient has chronic anemia, dated back at least to some protein. She has heavy menstrual period patient follows up with gynecology and has a pelvic ultrasound scheduled for further work-up Patient reports feeling tired sometimes.  Denies any black or bloody stool.  Colonoscopy is  up-to-date.  INTERVAL HISTORY Nicole Burns is a 46 y.o. female who has above history reviewed by me today presents for follow up visit of IDA  Patient tolerates IV Venofer treatments.  Chronic fatigue.  She takes RYZE Mushroom Coffee and has felt possible slight improvement of her heavy menstrual period.  Review of Systems  Constitutional:  Positive for fatigue. Negative for appetite change, chills and fever.  HENT:   Negative for hearing loss and voice change.   Eyes:  Negative for eye problems.  Respiratory:  Negative for chest tightness and cough.   Cardiovascular:  Negative for chest pain.  Gastrointestinal:  Negative for abdominal distention, abdominal pain and blood in stool.  Endocrine: Negative for hot flashes.  Genitourinary:  Positive for menstrual problem. Negative for difficulty urinating and frequency.   Musculoskeletal:  Negative for arthralgias.  Skin:  Negative for itching and rash.  Neurological:  Negative for extremity weakness.  Hematological:  Negative for adenopathy.  Psychiatric/Behavioral:  Negative for confusion.     MEDICAL HISTORY:  Past Medical History:  Diagnosis Date   Abortion, missed 08/09/2015   Diabetes mellitus without complication    Fatty liver    Gallbladder polyp    Hepatic cyst    History of chicken pox    Hypertension    Lower extremity edema 09/10/2021   Migraine    After menses cycle.    Pure hypercholesterolemia 09/10/2021   Umbilical hernia 2014    SURGICAL HISTORY: Past Surgical  History:  Procedure Laterality Date   CESAREAN SECTION     1993 & 2003   COLONOSCOPY WITH PROPOFOL N/A 04/26/2021   Procedure: COLONOSCOPY WITH PROPOFOL;  Surgeon: Pasty Spillers, MD;  Location: [facility] ENDOSCOPY;  Service: Endoscopy;  Laterality: N/A;   DILATION AND EVACUATION N/A 08/01/2015   Procedure: DILATATION AND EVACUATION With Tissue Sent For Chromosomal Analysis;  Surgeon: Maxie Better, MD;  Location: [facility];  Service: Gynecology;   Laterality: N/A;   HERNIA REPAIR  2007   OPERATIVE ULTRASOUND N/A 08/01/2015   Procedure: OPERATIVE ULTRASOUND;  Surgeon: Maxie Better, MD;  Location: [facility];  Service: Gynecology;  Laterality: N/A;    SOCIAL HISTORY: Social History   Socioeconomic History   Marital status: Married    Spouse name: Not on file   Number of children: Not on file   Years of education: Not on file   Highest education level: Not on file  Occupational History   Not on file  Tobacco Use   Smoking status: Never   Smokeless tobacco: Never  Vaping Use   Vaping Use: Never used  Substance and Sexual Activity   Alcohol use: No   Drug use: No   Sexual activity: Yes  Other Topics Concern   Not on file  Social History Narrative   Not on file   Social Determinants of Health   Financial Resource Strain: Low Risk  (09/10/2021)   Overall Financial Resource Strain (CARDIA)    Difficulty of Paying Living Expenses: Not hard at all  Food Insecurity: No Food Insecurity (09/10/2021)   Hunger Vital Sign    Worried About Running Out of Food in the Last Year: Never true    Ran Out of Food in the Last Year: Never true  Transportation Needs: No Transportation Needs (09/10/2021)   PRAPARE - Administrator, Civil Service (Medical): No    Lack of Transportation (Non-Medical): No  Physical Activity: Inactive (09/10/2021)   Exercise Vital Sign    Days of Exercise per Week: 0 days    Minutes of Exercise per Session: 0 min  Stress: Not on file  Social Connections: Not on file  Intimate Partner Violence: Not on file    FAMILY HISTORY: Family History  Problem Relation Age of Onset   Hypertension Mother    Hyperlipidemia Father    Heart disease Father    Stroke Father    Diabetes Father    Hypertension Father    Hypertension Sister    Hyperlipidemia Maternal Grandmother    Cancer Maternal Grandmother        Breast   Breast cancer Maternal Grandmother    Kidney disease Paternal Grandmother     Diabetes Paternal Grandmother     ALLERGIES:  is allergic to lisinopril, olmesartan, maxzide [hydrochlorothiazide w-triamterene], and vicodin [hydrocodone-acetaminophen].  MEDICATIONS:  Current Outpatient Medications  Medication Sig Dispense Refill   ALPRAZolam (XANAX) 0.25 MG tablet Take 1 tablet (0.25 mg total) by mouth daily as needed for anxiety. 20 tablet 0   amLODipine (NORVASC) 10 MG tablet Take 1 tablet (10 mg total) by mouth daily. 45 tablet 0   furosemide (LASIX) 20 MG tablet TAKE ONE TAB DAILY AS NEEDED FOR WEIGHT GAIN OR SWELLING 30 tablet 6   glucose blood test strip Use to check blood sugars twice daily 200 each 3   HOMEOPATHIC PRODUCTS PO Take by mouth. Mega[employer] Blood Builder - vitamin C 15 mg, folic acid 400 mcg, Vitamin B12 30 mcg, Iron 26 mg  ondansetron (ZOFRAN) 4 MG tablet Take 1 tablet (4 mg total) by mouth every 8 (eight) hours as needed for nausea or vomiting. 30 tablet 1   spironolactone (ALDACTONE) 25 MG tablet Take 1 tablet (25 mg total) by mouth daily. 90 tablet 1   tirzepatide (MOUNJARO) 2.5 MG/0.5ML Pen Inject 2.5 mg into the skin once a week. 2 mL 2   No current facility-administered medications for this visit.     PHYSICAL EXAMINATION: Vitals:   11/10/22 1411  BP: (!) 142/68  Pulse: 83  Resp: 18  Temp: 97.9 F (36.6 C)  SpO2: 100%   Filed Weights   11/10/22 1411  Weight: 260 lb 4.8 oz (118.1 kg)    Physical Exam Constitutional:      General: She is not in acute distress. HENT:     Head: Normocephalic.  Eyes:     General: No scleral icterus. Cardiovascular:     Rate and Rhythm: Normal rate.     Heart sounds: Normal heart sounds.  Pulmonary:     Effort: Pulmonary effort is normal. No respiratory distress.     Breath sounds: No wheezing.  Abdominal:     General: Bowel sounds are normal. There is no distension.     Palpations: Abdomen is soft.  Musculoskeletal:        General: No deformity. Normal range of motion.     Cervical back:  Normal range of motion.  Skin:    General: Skin is warm and dry.     Findings: No erythema.  Neurological:     Mental Status: She is alert and oriented to person, place, and time. Mental status is at baseline.     Cranial Nerves: No cranial nerve deficit.  Psychiatric:        Mood and Affect: Mood normal.     LABORATORY DATA:  I have reviewed the data as listed    Latest Ref Rng & Units 11/10/2022    2:26 PM 08/04/2022   10:46 AM 06/30/2022   12:55 PM  CBC  WBC 4.0 - 10.5 K/uL 8.8  7.3  9.0   Hemoglobin 12.0 - 15.0 g/dL 96.0  45.4  9.5   Hematocrit 36.0 - 46.0 % 34.2  33.9  33.2   Platelets 150 - 400 K/uL 298  292  388       Latest Ref Rng & Units 07/24/2022   10:45 AM 04/15/2022    4:11 PM 11/15/2021    8:59 AM  CMP  Glucose 70 - 99 mg/dL 098  90  88   BUN 6 - 23 mg/dL Creatinine 0.40 - 1.20 mg/dL 1.19  1.47  8.29   Sodium 135 - 145 mEq/L 135  135  136   Potassium 3.5 - 5.1 mEq/L 4.1  3.6  3.8   Chloride 96 - 112 mEq/L 105  104  106   CO2 19 - 32 mEq/L Calcium 8.4 - 10.5 mg/dL 9.7  9.6  9.3   Total Protein 6.0 - 8.3 g/dL 7.3  7.9  7.1   Total Bilirubin 0.2 - 1.2 mg/dL 0.4  0.5  0.4   Alkaline Phos 39 - 117 U/L 64  65  55   AST 0 - 37 U/L ALT 0 - 35 U/L Iron/TIBC/Ferritin/ %Sat    Component Value Date/Time  IRON 31 11/10/2022 1426   TIBC 347 11/10/2022 1426   FERRITIN 11 11/10/2022 1426   IRONPCTSAT 9 (L) 11/10/2022 1426   IRONPCTSAT 4 (L) 11/15/2021 0859      RADIOGRAPHIC STUDIES: I have personally reviewed the radiological images as listed and agreed with the findings in the report. No results found.

## 2022-11-10 NOTE — Assessment & Plan Note (Addendum)
#  Iron deficiency anemia due to chronic blood loss. Labs reviewed and discussed with patient. Hemoglobin and iron panel have both improved, although still iron deficient.  Recommend additional IV Venofer weekly x4. Patient denies any chance of pregnancy and declines pregnancy testing.

## 2022-11-10 NOTE — Assessment & Plan Note (Signed)
follow-up with gynecology.  

## 2022-11-11 ENCOUNTER — Telehealth: Payer: Self-pay

## 2022-11-11 NOTE — Telephone Encounter (Signed)
-----   Message from Rickard Patience, MD sent at 11/10/2022  6:34 PM EDT ----- Please arrange her to get IV venofer weekly x 4. She is aware about this recommendation.

## 2022-11-13 NOTE — Telephone Encounter (Signed)
Confirmed pt is not using the Ozempic, she is currently using Mounjaro.

## 2022-11-17 MED FILL — Iron Sucrose Inj 20 MG/ML (Fe Equiv): INTRAVENOUS | Qty: 10 | Status: AC

## 2022-11-18 ENCOUNTER — Other Ambulatory Visit: Payer: Self-pay | Admitting: Cardiovascular Disease

## 2022-11-18 ENCOUNTER — Inpatient Hospital Stay: Payer: Managed Care, Other (non HMO)

## 2022-11-24 MED FILL — Iron Sucrose Inj 20 MG/ML (Fe Equiv): INTRAVENOUS | Qty: 10 | Status: AC

## 2022-11-25 ENCOUNTER — Inpatient Hospital Stay: Payer: Managed Care, Other (non HMO) | Attending: Oncology

## 2022-11-25 DIAGNOSIS — N920 Excessive and frequent menstruation with regular cycle: Secondary | ICD-10-CM | POA: Insufficient documentation

## 2022-11-25 DIAGNOSIS — D500 Iron deficiency anemia secondary to blood loss (chronic): Secondary | ICD-10-CM | POA: Insufficient documentation

## 2022-12-01 ENCOUNTER — Encounter (HOSPITAL_BASED_OUTPATIENT_CLINIC_OR_DEPARTMENT_OTHER): Payer: Self-pay | Admitting: Cardiovascular Disease

## 2022-12-01 ENCOUNTER — Ambulatory Visit (HOSPITAL_BASED_OUTPATIENT_CLINIC_OR_DEPARTMENT_OTHER): Payer: Managed Care, Other (non HMO) | Admitting: Cardiovascular Disease

## 2022-12-01 VITALS — BP 146/96 | HR 78 | Ht 63.0 in | Wt 254.4 lb

## 2022-12-01 DIAGNOSIS — I10 Essential (primary) hypertension: Secondary | ICD-10-CM

## 2022-12-01 DIAGNOSIS — Z8249 Family history of ischemic heart disease and other diseases of the circulatory system: Secondary | ICD-10-CM | POA: Insufficient documentation

## 2022-12-01 DIAGNOSIS — M7989 Other specified soft tissue disorders: Secondary | ICD-10-CM | POA: Diagnosis not present

## 2022-12-01 HISTORY — DX: Other specified soft tissue disorders: M79.89

## 2022-12-01 HISTORY — DX: Family history of ischemic heart disease and other diseases of the circulatory system: Z82.49

## 2022-12-01 MED ORDER — HYDRALAZINE HCL 25 MG PO TABS: 25.0000 mg | ORAL_TABLET | Freq: Two times a day (BID) | ORAL | 3 refills | Status: DC

## 2022-12-01 NOTE — Assessment & Plan Note (Signed)
Blood pressure uncontrolled.  Continue amlodipine and spironolactone.  Will add hydralazine 25 mg twice daily.  Check blood pressures and bring to follow-up.  Continue to limit sodium.  Encouraged to work on increasing exercise to at least 150 minutes weekly.

## 2022-12-01 NOTE — Assessment & Plan Note (Addendum)
She notes a strong family history of premature CAD.  We discussed coronary calcium scoring and she declined.  Check fasting lipids and CMP today.

## 2022-12-01 NOTE — Patient Instructions (Addendum)
Medication Instructions:  Your physician has recommended you make the following change in your medication:  Start: Hydralazine 25mg  twice daily   Lab Work: LP/CMET TODAY   Follow-Up: 1 MONTH WITH PHARM D IN HTN CLINIC      Referrals:  We have referred you to [facility]! They will call you to get you scheduled!  IF YOU HAVE NOT HEARD FROM THEM IN 2 WEEKS YOU CAN CALL THEM AT THE NUMBER HIGHLIGHTED

## 2022-12-01 NOTE — Progress Notes (Signed)
Advanced Hypertension Clinic Follow-up:    Date:  12/01/2022   ID:  Nicole Burns, DOB [birth year], MRN [id number]  PCP:  Sherlene Shams, MD  Cardiologist:  None  Nephrologist:  Referring MD: Sherlene Shams, MD   CC: Hypertension  History of Present Illness:    Nicole Burns is a 47 y.o. female with a hx of hypertension, diabetes, obesity, and migraine, here for follow-up. She was initially seen in the hypertension clinic 12/11/2020. She saw Dr. Cherly Hensen on 09/2020 and her blood pressure was 150/92. She first developed hypertension during pregnancy, and she did not tolerate HCTZ. She was started on spironolactone and encouraged to increase her exercise. She was referred to PREP, and given Lasix to use as needed. She followed up with our pharmacist and her BP was improved but above goal, so amlodipine was increased. Olmesartan was subsequently added, but she developed GI side effects and this was stopped. She was started on Ozempic and lost 10 lbs at her last visit.    She continued to report stable blood pressures around 130/80 at home. Slightly above goal however she was doing well with weight loss on Ozempic. She was encouraged to keep working on increasing her exercise. Her LE swelling had been stable on furosemide. Cholesterol was borderline; she was uninterested in pursuing coronary calcium scoring.  Today, she is feeling well overall. In clinic today her blood pressure is 148/87 (146/96 on manual recheck); she confirms taking her antihypertensives this morning. Usually she takes amlodipine at night, and spironolactone in the morning. Her home blood pressures have been similar to her readings today, and sometimes a little lower. 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 When she wakes up in the mornings the swelling has resolved. She does own compression socks as well. She has been concerned  about her weight. She admits that she needs to work on increasing her formal exercise. She does enjoy Zumba and walking, but is limited by a lack of motivation and a busy schedule. Lately she has been eliminating sodium from her diet as much as possible. Recently she was switched from Ozempic to [facility], which she correlates with recent menstrual cycle changes. She denies any palpitations, chest pain, shortness of breath, lightheadedness, headaches, syncope, orthopnea, or PND.  Previous antihypertensives: Lisinopril-lip swelling, shortness of breath HCTZ-leg cramps, fatigue Spironolactone- unsure why she stopped Olmesartan - GI side effects  Past Medical History:  Diagnosis Date   Abortion, missed 08/09/2015   Diabetes mellitus without complication (HCC)    Family history of premature CAD 12/01/2022   Fatty liver    Gallbladder polyp    Hepatic cyst    History of chicken pox    Hypertension    Leg swelling 12/01/2022   Lower extremity edema 09/10/2021   Migraine    After menses cycle.    Pure hypercholesterolemia 09/10/2021   Umbilical hernia 2014    Past Surgical History:  Procedure Laterality Date   CESAREAN SECTION     1993 & 2003   COLONOSCOPY WITH PROPOFOL N/A 04/26/2021   Procedure: COLONOSCOPY WITH PROPOFOL;  Surgeon: Pasty Spillers, MD;  Location: [facility] ENDOSCOPY;  Service: Endoscopy;  Laterality: N/A;   DILATION AND EVACUATION N/A 08/01/2015   Procedure: DILATATION AND EVACUATION With Tissue Sent For Chromosomal Analysis;  Surgeon: Maxie Better, MD;  Location: [facility];  Service: Gynecology;  Laterality: N/A;   HERNIA REPAIR  2007  OPERATIVE ULTRASOUND N/A 08/01/2015   Procedure: OPERATIVE ULTRASOUND;  Surgeon: Maxie Better, MD;  Location: [facility];  Service: Gynecology;  Laterality: N/A;    Current Medications: Current Meds  Medication Sig   ALPRAZolam (XANAX) 0.25 MG tablet Take 1 tablet (0.25 mg total) by mouth daily as needed for anxiety.    amLODipine (NORVASC) 10 MG tablet Take 1 tablet (10 mg total) by mouth daily.   furosemide (LASIX) 20 MG tablet TAKE ONE TAB DAILY AS NEEDED FOR WEIGHT GAIN OR SWELLING   glucose blood test strip Use to check blood sugars twice daily   HOMEOPATHIC PRODUCTS PO Take by mouth. [employer] Blood Builder - vitamin C 15 mg, folic acid 400 mcg, Vitamin B12 30 mcg, Iron 26 mg   hydrALAZINE (APRESOLINE) 25 MG tablet Take 1 tablet (25 mg total) by mouth every 12 (twelve) hours.   ondansetron (ZOFRAN) 4 MG tablet Take 1 tablet (4 mg total) by mouth every 8 (eight) hours as needed for nausea or vomiting.   spironolactone (ALDACTONE) 25 MG tablet Take 1 tablet (25 mg total) by mouth daily.   tirzepatide [facility]) 2.5 MG/0.5ML Pen Inject 2.5 mg into the skin once a week.     Allergies:   Lisinopril, Olmesartan, Maxzide [hydrochlorothiazide w-triamterene], and Vicodin [hydrocodone-acetaminophen]   Social History   Socioeconomic History   Marital status: Married    Spouse name: Not on file   Number of children: Not on file   Years of education: Not on file   Highest education level: Not on file  Occupational History   Not on file  Tobacco Use   Smoking status: Never   Smokeless tobacco: Never  Vaping Use   Vaping Use: Never used  Substance and Sexual Activity   Alcohol use: No   Drug use: No   Sexual activity: Yes  Other Topics Concern   Not on file  Social History Narrative   Not on file   Social Determinants of Health   Financial Resource Strain: Low Risk  (09/10/2021)   Overall Financial Resource Strain (CARDIA)    Difficulty of Paying Living Expenses: Not hard at all  Food Insecurity: No Food Insecurity (09/10/2021)   Hunger Vital Sign    Worried About Running Out of Food in the Last Year: Never true    Ran Out of Food in the Last Year: Never true  Transportation Needs: No Transportation Needs (09/10/2021)   PRAPARE - Administrator, Civil Service (Medical): No    Lack of  Transportation (Non-Medical): No  Physical Activity: Inactive (09/10/2021)   Exercise Vital Sign    Days of Exercise per Week: 0 days    Minutes of Exercise per Session: 0 min  Stress: Not on file  Social Connections: Not on file     Family History: The patient's family history includes Breast cancer in her maternal grandmother; Cancer in her maternal grandmother; Diabetes in her father and paternal grandmother; Heart disease in her father; Hyperlipidemia in her father and maternal grandmother; Hypertension in her father, mother, and sister; Kidney disease in her paternal grandmother; Stroke in her father.  ROS:   Please see the history of present illness.    (+) Bilateral ankle swelling All other systems reviewed and are negative.  EKGs/Labs/Other Studies Reviewed:    EKG:  EKG is personally reviewed. 12/01/2022:  Sinus Rhythm. Rate 78 bpm. Nonspecific T wave abnormalities. 09/10/2021: Sinus rhythm. Rate 77 bpm. 12/11/2020: NSR, rate 90 bpm, Nonspecific T wave abnormalities  Recent Labs: 07/24/2022: ALT 13; BUN 10; Creatinine, Ser 0.81; Potassium 4.1; Sodium 135 11/10/2022: Hemoglobin 10.5; Platelets 298   Recent Lipid Panel    Component Value Date/Time   CHOL 163 07/24/2022 1045   TRIG 71.0 07/24/2022 1045   HDL 47.90 07/24/2022 1045   CHOLHDL 3 07/24/2022 1045   VLDL 14.2 07/24/2022 1045   LDLCALC 101 (H) 07/24/2022 1045   LDLDIRECT 102.0 07/24/2022 1045    Physical Exam:    VS:  BP (!) 146/96 (BP Location: Right Arm, Patient Position: Sitting, Cuff Size: Large)   Pulse 78   Ht 5\' 3"  (1.6 m)   Wt 254 lb 6.4 oz (115.4 kg)   BMI 45.06 kg/m  , BMI Body mass index is 45.06 kg/m. GENERAL:  Well appearing HEENT: Pupils equal round and reactive, fundi not visualized, oral mucosa unremarkable NECK:  No jugular venous distention, waveform within normal limits, carotid upstroke brisk and symmetric, no bruits, no thyromegaly LUNGS:  Clear to auscultation bilaterally HEART:  RRR.   PMI not displaced or sustained,S1 and S2 within normal limits, no S3, no S4, no clicks, no rubs, no murmurs ABD:  Flat, positive bowel sounds normal in frequency in pitch, no bruits, no rebound, no guarding, no midline pulsatile mass, no hepatomegaly, no splenomegaly EXT:  2 plus pulses throughout, no edema, no cyanosis no clubbing SKIN:  No rashes no nodules NEURO:  Cranial nerves II through XII grossly intact, motor grossly intact throughout PSYCH:  Cognitively intact, oriented to person place and time   ASSESSMENT/PLAN:    Essential hypertension, benign Blood pressure uncontrolled.  Continue amlodipine and spironolactone.  Will add hydralazine 25 mg twice daily.  Check blood pressures and bring to follow-up.  Continue to limit sodium.  Encouraged to work on increasing exercise to at least 150 minutes weekly.  Obesity, morbid She continues to struggle with her weight.  We discussed increasing exercise as above.  We will also refer her to the [facility].  Family history of premature CAD She notes a strong family history of premature CAD.  We discussed coronary calcium scoring and she declined.  Check fasting lipids and CMP today.   Screening for Secondary Hypertension:     12/11/2020   11:50 AM  Causes  Drugs/Herbals Screened     - Comments 2 Coke Zero, ibuprofen    Relevant Labs/Studies:    Latest Ref Rng & Units 07/24/2022   10:45 AM 04/15/2022    4:11 PM 11/15/2021    8:59 AM  Basic Labs  Sodium 135 - 145 mEq/L 135  135  136   Potassium 3.5 - 5.1 mEq/L 4.1  3.6  3.8   Creatinine 0.40 - 1.20 mg/dL 8.65  7.84  6.96        Latest Ref Rng & Units 08/18/2019    8:43 AM 08/07/2015    4:42 PM  Thyroid   TSH 0.35 - 4.50 uIU/mL 2.05  0.98     Disposition:    FU with APP in 1 month. FU with Derick Seminara C. Duke Salvia, MD, [facility] in 4 months.   Medication Adjustments/Labs and Tests Ordered: Current medicines are reviewed at length with the patient today.   Concerns regarding medicines are outlined above.   Orders Placed This Encounter  Procedures   Lipid panel   Comprehensive metabolic panel   Ambulatory referral to Family Practice   EKG 12-Lead   Meds ordered this encounter  Medications   hydrALAZINE (APRESOLINE) 25 MG tablet  Sig: Take 1 tablet (25 mg total) by mouth every 12 (twelve) hours.    Dispense:  120 tablet    Refill:  3   I,Mathew Stumpf,acting as a scribe for Chilton Si, MD.,have documented all relevant documentation on the behalf of Chilton Si, MD,as directed by  Chilton Si, MD while in the presence of Chilton Si, MD.  I, Latroy Gaymon C. Duke Salvia, MD have reviewed all documentation for this visit.  The documentation of the exam, diagnosis, procedures, and orders on 12/01/2022 are all accurate and complete.  Signed, Chilton Si, MD  12/01/2022 9:16 AM    [facility]

## 2022-12-01 NOTE — Assessment & Plan Note (Signed)
She continues to struggle with her weight.  We discussed increasing exercise as above.  We will also refer her to the [facility].

## 2022-12-02 ENCOUNTER — Inpatient Hospital Stay: Payer: Managed Care, Other (non HMO)

## 2022-12-02 VITALS — BP 161/88 | HR 72 | Temp 96.9°F | Resp 18

## 2022-12-02 DIAGNOSIS — D500 Iron deficiency anemia secondary to blood loss (chronic): Secondary | ICD-10-CM

## 2022-12-02 DIAGNOSIS — N920 Excessive and frequent menstruation with regular cycle: Secondary | ICD-10-CM | POA: Diagnosis not present

## 2022-12-02 LAB — COMPREHENSIVE METABOLIC PANEL
ALT: 14 IU/L (ref 0–32)
AST: 11 IU/L (ref 0–40)
Albumin/Globulin Ratio: 1.3 (ref 1.2–2.2)
Albumin: 4.1 g/dL (ref 3.9–4.9)
Alkaline Phosphatase: 80 IU/L (ref 44–121)
BUN/Creatinine Ratio: 10 (ref 9–23)
BUN: 10 mg/dL (ref 6–24)
Bilirubin Total: 0.3 mg/dL (ref 0.0–1.2)
CO2: 21 mmol/L (ref 20–29)
Calcium: 9.6 mg/dL (ref 8.7–10.2)
Chloride: 108 mmol/L — ABNORMAL HIGH (ref 96–106)
Creatinine, Ser: 0.99 mg/dL (ref 0.57–1.00)
Globulin, Total: 3.2 g/dL (ref 1.5–4.5)
Glucose: 100 mg/dL — ABNORMAL HIGH (ref 70–99)
Potassium: 4.9 mmol/L (ref 3.5–5.2)
Sodium: 140 mmol/L (ref 134–144)
Total Protein: 7.3 g/dL (ref 6.0–8.5)
eGFR: 70 mL/min/{1.73_m2} (ref >59)

## 2022-12-02 LAB — LIPID PANEL
Chol/HDL Ratio: 3.5 ratio (ref 0.0–4.4)
Cholesterol, Total: 162 mg/dL (ref 100–199)
HDL: 46 mg/dL (ref >39)
LDL Chol Calc (NIH): 103 mg/dL — ABNORMAL HIGH (ref 0–99)
Triglycerides: 68 mg/dL (ref 0–149)
VLDL Cholesterol Cal: 13 mg/dL (ref 5–40)

## 2022-12-02 MED ORDER — SODIUM CHLORIDE 0.9 % IV SOLN
200.0000 mg | Freq: Once | INTRAVENOUS | Status: AC
  Administered 2022-12-02: 200 mg via INTRAVENOUS
  Filled 2022-12-02: qty 200

## 2022-12-02 MED ORDER — SODIUM CHLORIDE 0.9 % IV SOLN
Freq: Once | INTRAVENOUS | Status: AC
  Filled 2022-12-02: qty 250

## 2022-12-07 ENCOUNTER — Other Ambulatory Visit: Payer: Self-pay | Admitting: Cardiovascular Disease

## 2022-12-08 MED FILL — Iron Sucrose Inj 20 MG/ML (Fe Equiv): INTRAVENOUS | Qty: 10 | Status: AC

## 2022-12-08 NOTE — Telephone Encounter (Signed)
Rx(s) sent to pharmacy electronically.  

## 2022-12-09 ENCOUNTER — Inpatient Hospital Stay: Payer: Managed Care, Other (non HMO)

## 2022-12-12 ENCOUNTER — Telehealth (HOSPITAL_BASED_OUTPATIENT_CLINIC_OR_DEPARTMENT_OTHER): Payer: Self-pay

## 2022-12-12 DIAGNOSIS — E7800 Pure hypercholesterolemia, unspecified: Secondary | ICD-10-CM

## 2022-12-12 MED ORDER — ROSUVASTATIN CALCIUM 5 MG PO TABS: 5.0000 mg | ORAL_TABLET | Freq: Every day | ORAL | 3 refills | Status: DC

## 2022-12-12 NOTE — Telephone Encounter (Signed)
Discussed further with provider and was advised that patient can hold hydralazine for the weekend to see if symptoms resolve or improve and call with readings on Monday.    Returned call to patient and advised the above recommendation, patient to call Tuesday with her updated pressures

## 2022-12-12 NOTE — Telephone Encounter (Addendum)
Results called to patient who verbalizes understanding! Labs ordered and mailed, prescriptions updated and sent to pharmacy on file.   Patient states she has been very tired, no energy, just feels bad. She has been taking it just once per day. She states her blood pressure has been running in the 130s, with an occaisonal low 140 while taking the medication just once per day.   Advised patient we would send this over for review and let her know what the provider says. For now she will continue medication as is       ----- Message from Alver Sorrow, NP sent at 12/11/2022  5:33 PM EDT ----- Normal kidney and liver function.  LDL (bad cholesterol) of 103. The 10-year ASCVD risk score (Arnett DK, et al., 2019) is: 19.9% which is high risk.  This indicates she has nearly 20% risk of having a cardiovascular event such as heart attack or stroke within the next 10 years.  Per [employer] of cardiology guidelines recommended Rosuvastatin 5mg  daily. This is a low dose of a well tolerated cholesterol medication. It helps to lower cholesterol, stabilize any existing plaque, and reduce cardiovascular risk. Repeat FLP/LFT in 3 months.   If she is hesitant - can start at 3x per week

## 2022-12-12 NOTE — Telephone Encounter (Signed)
Hydralazine unfortunately does not last 24 hours in the system so taking it once per day means her blood pressure is only lowered half of the time.  Recommend taking hydralazine 25mg  twice per day 12 hours for improvement of BP.  BP goal less than 130/80.  She does have IDA and recently had iron infusion which IDA may contribute to lowered energy level. Does she feel the change in energy is related to the Hydralazine? This would be an uncommon side effect.   Alver Sorrow, NP

## 2022-12-30 ENCOUNTER — Encounter: Payer: Self-pay | Admitting: Oncology

## 2023-01-01 ENCOUNTER — Encounter: Payer: Self-pay | Admitting: Oncology

## 2023-01-07 ENCOUNTER — Ambulatory Visit (HOSPITAL_BASED_OUTPATIENT_CLINIC_OR_DEPARTMENT_OTHER): Payer: Managed Care, Other (non HMO) | Admitting: Pharmacist Clinician (PhC)/ Clinical Pharmacy Specialist

## 2023-01-07 ENCOUNTER — Encounter (HOSPITAL_BASED_OUTPATIENT_CLINIC_OR_DEPARTMENT_OTHER): Payer: Self-pay | Admitting: Pharmacist Clinician (PhC)/ Clinical Pharmacy Specialist

## 2023-01-07 VITALS — BP 137/85 | Ht 63.0 in | Wt 252.8 lb

## 2023-01-07 DIAGNOSIS — I10 Essential (primary) hypertension: Secondary | ICD-10-CM | POA: Diagnosis not present

## 2023-01-07 MED ORDER — VALSARTAN 80 MG PO TABS: 80.0000 mg | ORAL_TABLET | Freq: Every day | ORAL | 6 refills | Status: DC

## 2023-01-07 NOTE — Patient Instructions (Signed)
Follow up appointment: 2 months - I'll reach out via MyChart to schedule  Go to the lab in 2 weeks  Take your BP meds as follows:  Start valsartan 80 mg once daily  Continue amlodipine and spironolactone  Check your blood pressure at home daily (if able) and keep record of the readings.  Hypertension "High blood pressure"  Hypertension is often called "The Silent Killer." It rarely causes symptoms until it is extremely  high or has done damage to other organs in the body. For this reason, you should have your  blood pressure checked regularly by your physician. We will check your blood pressure  every time you see a provider at one of our offices.   Your blood pressure reading consists of two numbers. Ideally, blood pressure should be  below 120/80. The first ("top") number is called the systolic pressure. It measures the  pressure in your arteries as your heart beats. The second ("bottom") number is called the diastolic pressure. It measures the pressure in your arteries as the heart relaxes between beats.  The benefits of getting your blood pressure under control are enormous. A 10-point  reduction in systolic blood pressure can reduce your risk of stroke by 27% and heart failure by 28%  Your blood pressure goal is < 130/80  To check your pressure at home you will need to:  1. Sit up in a chair, with feet flat on the floor and back supported. Do not cross your ankles or legs. 2. Rest your left arm so that the cuff is about heart level. If the cuff goes on your upper arm,  then just relax the arm on the table, arm of the chair or your lap. If you have a wrist cuff, we  suggest relaxing your wrist against your chest (think of it as Pledging the Flag with the  wrong arm).  3. Place the cuff snugly around your arm, about 1 inch above the crook of your elbow. The  cords should be inside the groove of your elbow.  4. Sit quietly, with the cuff in place, for about 5 minutes. After  that 5 minutes press the power  button to start a reading. 5. Do not talk or move while the reading is taking place.  6. Record your readings on a sheet of paper. Although most cuffs have a memory, it is often  easier to see a pattern developing when the numbers are all in front of you.  7. You can repeat the reading after 1-3 minutes if it is recommended  Make sure your bladder is empty and you have not had caffeine or tobacco within the last 30 min  Always bring your blood pressure log with you to your appointments. If you have not brought your monitor in to be double checked for accuracy, please bring it to your next appointment.  You can find a list of quality blood pressure cuffs at validatebp.org

## 2023-01-07 NOTE — Progress Notes (Signed)
Office Visit    Patient Name: Nicole Burns Date of Encounter: 01/07/2023  Primary Care Provider:  Sherlene Shams, MD Primary Cardiologist:  None  Chief Complaint    Hypertension - [facility]  Past Medical History   DM2 1/24 A1c 5.3 on Mounjaro (down from 7.0 last year)  HLD 5/24 LDL 103 on rosuvastatin 5  obesity On Mounjaro 2.5 mg, just recently started, previously on Ozempic       Allergies  Allergen Reactions   Lisinopril Shortness Of Breath   Olmesartan Nausea Only   Maxzide [Hydrochlorothiazide W-Triamterene] Other (See Comments)    Joint pain,  Malaise and bad mood   Vicodin [Hydrocodone-Acetaminophen] Palpitations    History of Present Illness    Nicole Burns is a 47 y.o. female patient who was referred to the [facility] in 2022.  She was most recently seen by Dr. Duke Salvia last month with pressure of 146/96 on amlodipine and spironolactone.  Dr. Duke Salvia added hydralazine 25 mg twice daily.  She called back about 10 days later complaining of fatigue, feeling bad.  She also has a diagnosis of IDA, which can cause those symptoms.  She was to hold the hydralazine for the weekend then report back her BP readings.    Today she returns for follow up.  States she cannot tolerate the hydralazine, it causes pressure in her head and makes her heart race.  She could tolerate single dose per day if she took with food, but because of Mounjaro, she isn't eating much.    Blood Pressure Goal:  130/80  Current Medications: amlodipine 10 mg qd, spironolactone 25 mg qd,   Previously tried:   lisinopril - SOB, olmesartan - nausea, maxzide - joint pain, malaise, bad mood, hydralazine - heart race/pressure in her head  Family Hx:   father with CABG, died later from MI at 41; mother living now, healthy w. Htn; 1 sister with hypertension; 2 daughters, 44, 25, second with hypertension   Social Hx: no tobacco, no alcohol, no regular caffeine    Diet: trying to watch sodium intake, doesn't eat out much, no deep fried; smokes or grills most foods, occasional air fried; vegetables mostly frozen; canned only if rinsed  Can't eat fried, or greasy or peanut butter now, no red meat; mostly chicken, ground [place], some pork; [place] vegetables fine, eating plenty, still snacking some - choosing plantain chips, popcorn (not microwave)   Exercise: work has her walking a lot, no gym yet  Home BP readings:    12 AM readings average 120/88 HR 81      14 PM readings average 124/93 HR 78   Accessory Clinical Findings    Lab Results  Component Value Date   CREATININE 0.99 12/01/2022   BUN 10 12/01/2022   NA 140 12/01/2022   K 4.9 12/01/2022   CL 108 (H) 12/01/2022   CO2 21 12/01/2022   Lab Results  Component Value Date   ALT 14 12/01/2022   AST 11 12/01/2022   ALKPHOS 80 12/01/2022   BILITOT 0.3 12/01/2022   Lab Results  Component Value Date   HGBA1C 5.3 07/24/2022    Screening for Secondary Hypertension:      12/11/2020   11:50 AM  Causes  Drugs/Herbals Screened     - Comments 2 Coke Zero, ibuprofen    Relevant Labs/Studies:    Latest Ref Rng & Units 12/01/2022    9:24 AM 07/24/2022   10:45 AM 04/15/2022  4:11 PM  Basic Labs  Sodium 134 - 144 mmol/L 140  135  135   Potassium 3.5 - 5.2 mmol/L 4.9  4.1  3.6   Creatinine 0.57 - 1.00 mg/dL 0.98  1.19  1.47        Latest Ref Rng & Units 08/18/2019    8:43 AM 08/07/2015    4:42 PM  Thyroid   TSH 0.35 - 4.50 uIU/mL 2.05  0.98                   Home Medications    Current Outpatient Medications  Medication Sig Dispense Refill   valsartan (DIOVAN) 80 MG tablet Take 1 tablet (80 mg total) by mouth daily. 30 tablet 6   ALPRAZolam (XANAX) 0.25 MG tablet Take 1 tablet (0.25 mg total) by mouth daily as needed for anxiety. 20 tablet 0   amLODipine (NORVASC) 10 MG tablet TAKE 1 TABLET BY MOUTH EVERY DAY 90 tablet 2   furosemide (LASIX) 20 MG tablet TAKE ONE TAB DAILY  AS NEEDED FOR WEIGHT GAIN OR SWELLING 30 tablet 6   glucose blood test strip Use to check blood sugars twice daily 200 each 3   HOMEOPATHIC PRODUCTS PO Take by mouth. Mega[employer] Blood Builder - vitamin C 15 mg, folic acid 400 mcg, Vitamin B12 30 mcg, Iron 26 mg     ondansetron (ZOFRAN) 4 MG tablet Take 1 tablet (4 mg total) by mouth every 8 (eight) hours as needed for nausea or vomiting. 30 tablet 1   rosuvastatin (CRESTOR) 5 MG tablet Take 1 tablet (5 mg total) by mouth daily. 90 tablet 3   spironolactone (ALDACTONE) 25 MG tablet Take 1 tablet (25 mg total) by mouth daily. 90 tablet 1   tirzepatide (MOUNJARO) 2.5 MG/0.5ML Pen Inject 2.5 mg into the skin once a week. 2 mL 2   No current facility-administered medications for this visit.     Assessment & Plan       Essential hypertension, benign Assessment: BP is uncontrolled in office BP 137/85 mmHg;  above the goal (<130/80). Tolerates amlodipine and spironolactone well without any side effects Denies SOB, palpitation, chest pain, headaches,or swelling Reiterated the importance of regular exercise and low salt diet   Plan:  Stop taking hydralazine Start taking valsartan 80 mg once daily Continue taking amlodipine and spironolactone Patient to keep record of BP readings with heart rate and report to [place] at the next visit Patient to follow up with PharmD in 2 months - I will reach out to you once I have August scheduled  Labs ordered today:  BMET 2 weeks   Phillips Hay PharmD CPP [facility]  [address] [place], [place] [address] [phone]

## 2023-01-07 NOTE — Assessment & Plan Note (Signed)
Assessment: BP is uncontrolled in office BP 137/85 mmHg;  above the goal (<130/80). Tolerates amlodipine and spironolactone well without any side effects Denies SOB, palpitation, chest pain, headaches,or swelling Reiterated the importance of regular exercise and low salt diet   Plan:  Stop taking hydralazine Start taking valsartan 80 mg once daily Continue taking amlodipine and spironolactone Patient to keep record of BP readings with heart rate and report to [place] at the next visit Patient to follow up with PharmD in 2 months - I will reach out to you once I have August scheduled  Labs ordered today:  BMET 2 weeks

## 2023-01-20 ENCOUNTER — Encounter (HOSPITAL_BASED_OUTPATIENT_CLINIC_OR_DEPARTMENT_OTHER): Payer: Self-pay | Admitting: Pharmacist Clinician (PhC)/ Clinical Pharmacy Specialist

## 2023-01-29 ENCOUNTER — Telehealth (HOSPITAL_BASED_OUTPATIENT_CLINIC_OR_DEPARTMENT_OTHER): Payer: Self-pay | Admitting: Cardiovascular Disease

## 2023-01-29 ENCOUNTER — Encounter (HOSPITAL_BASED_OUTPATIENT_CLINIC_OR_DEPARTMENT_OTHER): Payer: Self-pay

## 2023-01-29 NOTE — Telephone Encounter (Signed)
Left message for patient to call and discuss scheduling a follow up appointment with Phillips Hay, Pharm D on Tuesday 03/10/23 here at [facility].  Will also send My Chart message

## 2023-02-04 ENCOUNTER — Encounter (INDEPENDENT_AMBULATORY_CARE_PROVIDER_SITE_OTHER): Payer: Self-pay | Admitting: Internal Medicine

## 2023-02-04 DIAGNOSIS — Z0289 Encounter for other administrative examinations: Secondary | ICD-10-CM

## 2023-02-04 NOTE — Telephone Encounter (Signed)
Left message for patient to call and discuss scheduling the follow up appointment with the Pharm D as requested by Dr. Duke Salvia

## 2023-02-06 ENCOUNTER — Encounter (HOSPITAL_BASED_OUTPATIENT_CLINIC_OR_DEPARTMENT_OTHER): Payer: Self-pay | Admitting: Cardiovascular Disease

## 2023-02-06 NOTE — Telephone Encounter (Signed)
Left message for patient to call and schedule Pharm D appointment per 01/29/23 staff message from Captain Cook, Pharm D.  Will also mail letter requesting she call to schedule.  Patient has not responded to the My Chart message sent 01/29/23

## 2023-02-16 ENCOUNTER — Ambulatory Visit
Admission: RE | Admit: 2023-02-16 | Discharge: 2023-02-16 | Disposition: A | Discharge status: Home / Self Care | Payer: Managed Care, Other (non HMO) | Source: Ambulatory Visit | Attending: Internal Medicine | Admitting: Internal Medicine

## 2023-02-16 VITALS — BP 153/95 | HR 84 | Temp 99.2°F | Resp 18

## 2023-02-16 DIAGNOSIS — U071 COVID-19: Secondary | ICD-10-CM

## 2023-02-16 NOTE — ED Provider Notes (Signed)
Nicole Burns    CSN: [id number] Arrival date & time: 02/16/23  1448      History   Chief Complaint Chief Complaint  Patient presents with   Cough    I've been exposed to someone with Covid. Took at home test it appeared positive but need confirmation. - Entered by patient    HPI Nicole Burns is a 47 y.o. female.    Cough  Presents to urgent care with complaint of positive COVID test at home today.  She reports cough, sore throat, body aches.  Symptoms x 2 days.  Past Medical History:  Diagnosis Date   Abortion, missed 08/09/2015   Diabetes mellitus without complication (HCC)    Family history of premature CAD 12/01/2022   Fatty liver    Gallbladder polyp    Hepatic cyst    History of chicken pox    Hypertension    Leg swelling 12/01/2022   Lower extremity edema 09/10/2021   Migraine    After menses cycle.    Pure hypercholesterolemia 09/10/2021   Umbilical hernia 2014    Patient Active Problem List   Diagnosis Date Noted   Leg swelling 12/01/2022   Family history of premature CAD 12/01/2022   Lower extremity edema 09/10/2021   Pure hypercholesterolemia 09/10/2021   Screen for colon cancer    Polyp of sigmoid colon    Iron deficiency anemia due to chronic blood loss 07/19/2019   Hepatic steatosis 01/17/2015   Encounter for preventive health examination 02/04/2014   Menorrhagia 05/04/2013   Recurrent ventral hernia 01/24/2013   Umbilical hernia    Obesity, diabetes, and hypertension syndrome (HCC) 10/31/2012   Obesity, morbid (HCC) 10/13/2012   Premenstrual symptom 10/13/2012   Essential hypertension, benign 10/13/2012    Past Surgical History:  Procedure Laterality Date   CESAREAN SECTION     1993 & 2003   COLONOSCOPY WITH PROPOFOL N/A 04/26/2021   Procedure: COLONOSCOPY WITH PROPOFOL;  Surgeon: Pasty Spillers, MD;  Location: [facility] ENDOSCOPY;  Service: Endoscopy;  Laterality: N/A;   DILATION AND EVACUATION N/A 08/01/2015    Procedure: DILATATION AND EVACUATION With Tissue Sent For Chromosomal Analysis;  Surgeon: Maxie Better, MD;  Location: [facility] ORS;  Service: Gynecology;  Laterality: N/A;   HERNIA REPAIR  2007   OPERATIVE ULTRASOUND N/A 08/01/2015   Procedure: OPERATIVE ULTRASOUND;  Surgeon: Maxie Better, MD;  Location: [facility] OR[facility];  Service: Gynecology;  Laterality: N/A;    OB History     Gravida  3   Para  2   Term      Preterm      AB  1   Living  2      SAB  1   IAB      Ectopic      Multiple      Live Births           Obstetric Comments  1st Menstrual Cycle:  10 1st Pregnancy: 15          Home Medications    Prior to Admission medications   Medication Sig Start Date End Date Taking? Authorizing Provider  ALPRAZolam (XANAX) 0.25 MG tablet Take 1 tablet (0.25 mg total) by mouth daily as needed for anxiety. 04/15/22   Sherlene Shams, MD  amLODipine (NORVASC) 10 MG tablet TAKE 1 TABLET BY MOUTH EVERY DAY 12/08/22   Chilton Si, MD  furosemide (LASIX) 20 MG tablet TAKE ONE TAB DAILY AS NEEDED FOR WEIGHT GAIN OR SWELLING 09/16/21  Chilton Si, MD  glucose blood test strip Use to check blood sugars twice daily 03/12/21   Sherlene Shams, MD  HOMEOPATHIC PRODUCTS PO Take by mouth. [employer] Blood Builder - vitamin C 15 mg, folic acid 400 mcg, Vitamin B12 30 mcg, Iron 26 mg    [provider]  ondansetron (ZOFRAN) 4 MG tablet Take 1 tablet (4 mg total) by mouth every 8 (eight) hours as needed for nausea or vomiting. 11/18/21   Sherlene Shams, MD  rosuvastatin (CRESTOR) 5 MG tablet Take 1 tablet (5 mg total) by mouth daily. 12/12/22 03/12/23  Alver Sorrow, NP  spironolactone (ALDACTONE) 25 MG tablet Take 1 tablet (25 mg total) by mouth daily. 10/23/22 04/21/23  Sherlene Shams, MD  tirzepatide [facility]) 2.5 MG/0.5ML Pen Inject 2.5 mg into the skin once a week. 10/23/22   Sherlene Shams, MD  valsartan (DIOVAN) 80 MG tablet Take 1 tablet (80 mg total) by mouth  daily. Patient not taking: Reported on 02/16/2023 01/07/23   Chilton Si, MD    Family History Family History  Problem Relation Age of Onset   Hypertension Mother    Hyperlipidemia Father    Heart disease Father    Stroke Father    Diabetes Father    Hypertension Father    Hypertension Sister    Hyperlipidemia Maternal Grandmother    Cancer Maternal Grandmother        Breast   Breast cancer Maternal Grandmother    Kidney disease Paternal Grandmother    Diabetes Paternal Grandmother     Social History Social History   Tobacco Use   Smoking status: Never   Smokeless tobacco: Never  Vaping Use   Vaping status: Never Used  Substance Use Topics   Alcohol use: No   Drug use: No     Allergies   Lisinopril, Olmesartan, Valsartan, Maxzide [hydrochlorothiazide w-triamterene], and Vicodin [hydrocodone-acetaminophen]   Review of Systems Review of Systems  Respiratory:  Positive for cough.      Physical Exam Triage Vital Signs ED Triage Vitals  Encounter Vitals Group     BP 02/16/23 1513 (!) 153/95     Systolic BP Percentile --      Diastolic BP Percentile --      Pulse Rate 02/16/23 1513 84     Resp 02/16/23 1513 18     Temp 02/16/23 1513 99.2 F (37.3 C)     Temp Source 02/16/23 1513 Oral     SpO2 02/16/23 1513 98 %     Weight --      Height --      Head Circumference --      Peak Flow --      Pain Score 02/16/23 1512 4     Pain Loc --      Pain Education --      Exclude from Growth Chart --    No data found.  Updated Vital Signs BP (!) 153/95 (BP Location: Right Arm)   Pulse 84   Temp 99.2 F (37.3 C) (Oral)   Resp 18   LMP 02/01/2023   SpO2 98%   Visual Acuity Right Eye Distance:   Left Eye Distance:   Bilateral Distance:    Right Eye Near:   Left Eye Near:    Bilateral Near:     Physical Exam Vitals reviewed.  Constitutional:      Appearance: Normal appearance. She is ill-appearing.  Cardiovascular:     Rate and Rhythm: Normal  rate and regular rhythm.     Pulses: Normal pulses.     Heart sounds: Normal heart sounds.  Pulmonary:     Effort: Pulmonary effort is normal.     Breath sounds: Normal breath sounds.  Skin:    General: Skin is warm and dry.  Neurological:     General: No focal deficit present.     Mental Status: She is alert and oriented to person, place, and time.  Psychiatric:        Mood and Affect: Mood normal.        Behavior: Behavior normal.      UC Treatments / Results  Labs (all labs ordered are listed, but only abnormal results are displayed) Labs Reviewed - No data to display  EKG   Radiology No results found.  Procedures Procedures (including critical care time)  Medications Ordered in UC Medications - No data to display  Initial Impression / Assessment and Plan / UC Course  I have reviewed the triage vital signs and the nursing notes.  Pertinent labs & imaging results that were available during my care of the patient were reviewed by me and considered in my medical decision making (see chart for details).   Dalisa Kuehler is a 47 y.o. female presenting with covid-19. Patient is afebrile with recent antipyretics, satting well on room air. Overall is ill appearing though non-toxic, well hydrated, without respiratory distress. Pulmonary exam is unremarkable.  Lungs CTAB without wheezing, rhonchi, rales. RRR without murmurs, rubs, gallops.  Reviewed relevant chart history.   No red flag symptoms.  Recommending supportive care and use of OTC medication for symptom control.  Counseled patient on potential for adverse effects with medications prescribed/recommended today, ER and return-to-clinic precautions discussed, patient verbalized understanding and agreement with care plan.  Final Clinical Impressions(s) / UC Diagnoses   Final diagnoses:  None   Discharge Instructions   None    ED Prescriptions   None    PDMP not reviewed this encounter.   Charma Igo, [place] 02/16/23 [phone]

## 2023-02-16 NOTE — Discharge Instructions (Signed)
Follow up here or with your primary care provider if your symptoms are worsening or not improving.     

## 2023-02-16 NOTE — ED Triage Notes (Signed)
Patient to Urgent Care with complaints of a positive Covid test at home today. Reports cough/ sore throat/ generalized body aches. Symptoms started Saturday evening.   Reports Covid exposure from husband.

## 2023-03-05 ENCOUNTER — Encounter (INDEPENDENT_AMBULATORY_CARE_PROVIDER_SITE_OTHER): Payer: Self-pay | Admitting: Family Medicine

## 2023-03-05 ENCOUNTER — Ambulatory Visit (INDEPENDENT_AMBULATORY_CARE_PROVIDER_SITE_OTHER): Payer: Managed Care, Other (non HMO) | Admitting: Family Medicine

## 2023-03-05 VITALS — BP 130/85 | HR 78 | Temp 97.9°F | Ht 64.0 in | Wt 243.0 lb

## 2023-03-05 DIAGNOSIS — F32A Depression, unspecified: Secondary | ICD-10-CM | POA: Diagnosis not present

## 2023-03-05 DIAGNOSIS — E1159 Type 2 diabetes mellitus with other circulatory complications: Secondary | ICD-10-CM | POA: Diagnosis not present

## 2023-03-05 DIAGNOSIS — R5383 Other fatigue: Secondary | ICD-10-CM | POA: Diagnosis not present

## 2023-03-05 DIAGNOSIS — E1169 Type 2 diabetes mellitus with other specified complication: Secondary | ICD-10-CM | POA: Diagnosis not present

## 2023-03-05 DIAGNOSIS — E1165 Type 2 diabetes mellitus with hyperglycemia: Secondary | ICD-10-CM | POA: Diagnosis not present

## 2023-03-05 DIAGNOSIS — I152 Hypertension secondary to endocrine disorders: Secondary | ICD-10-CM

## 2023-03-05 DIAGNOSIS — Z6841 Body Mass Index (BMI) 40.0 and over, adult: Secondary | ICD-10-CM

## 2023-03-05 DIAGNOSIS — E785 Hyperlipidemia, unspecified: Secondary | ICD-10-CM

## 2023-03-05 DIAGNOSIS — Z9189 Other specified personal risk factors, not elsewhere classified: Secondary | ICD-10-CM

## 2023-03-05 DIAGNOSIS — Z7985 Long-term (current) use of injectable non-insulin antidiabetic drugs: Secondary | ICD-10-CM

## 2023-03-05 DIAGNOSIS — E669 Obesity, unspecified: Secondary | ICD-10-CM

## 2023-03-05 DIAGNOSIS — R0602 Shortness of breath: Secondary | ICD-10-CM

## 2023-03-05 DIAGNOSIS — I2510 Atherosclerotic heart disease of native coronary artery without angina pectoris: Secondary | ICD-10-CM

## 2023-03-05 DIAGNOSIS — Z1331 Encounter for screening for depression: Secondary | ICD-10-CM

## 2023-03-05 DIAGNOSIS — D500 Iron deficiency anemia secondary to blood loss (chronic): Secondary | ICD-10-CM

## 2023-03-05 NOTE — Progress Notes (Signed)
Chief Complaint:   OBESITY Nicole Burns (MR# [id number]) is a 47 y.o. female who presents for evaluation and treatment of obesity and related comorbidities. Current BMI is Body mass index is 41.71 kg/m. Nicole Burns has been struggling with her weight for many years and has been unsuccessful in either losing weight, maintaining weight loss, or reaching her healthy weight goal.  Nicole Burns is currently in the action stage of change and ready to dedicate time achieving and maintaining a healthier weight. Nicole Burns is interested in becoming our patient and working on intensive lifestyle modifications including (but not limited to) diet and exercise for weight loss.  Patient was referred by her sister who sees Dr. Manson Passey in [place].  She went to Dr. Lina Sayre information session.  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  Typical hours are 9-6pm.  Living at home with husband, Nicole Burns, daughter Nicole Burns, daughter Nicole Burns, and granddaughter Nicole Burns.  She says her family is supportive of her and sometimes eats meals together.  Desired weight is 150lbs; last time she was that weight was after her first pregnancy. 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  Felt the personal trainer worked best because it kept her accountable.   Eats fast food or take out 3-4x a week; [employer], [place] food, [facility] pending schedules. Skips breakfast daily and occasionally lunch; isn't hungry for breakfast.   Food Recall: Water in the am with medicine.  May have a bag of chips and a cup of coffee with hazelnut  in the am may have belvita cookies.  For lunch may have healthy choice meal or [place] food (chicken quesadilla with salad and rice- eats 1/2, whole salad and 1/4) or burgerfy (chicken sandwich and fries- eats all sandwich and some fries).  May eat a bag of chips prior to leaving work.  If cooking she will do egg  roll in a bowl (ground [place]), veggie stir fry or spaghetti with ground [place] or tacos (3).  Sometimes she eats after dinner and will have a sweet and may get cookies (1/2 crumbl cookie) or pork rinds.  Garrett's habits were reviewed today and are as follows: Her family eats meals together, she thinks her family will eat healthier with her, she struggles with family and or coworkers weight loss sabotage, her desired weight loss is 93 lbs, she has been heavy most of her life, she started gaining weight in elementary school, her heaviest weight ever was 275 pounds, she has significant food cravings issues, she snacks frequently in the evenings, she skips meals frequently, she is frequently drinking liquids with calories, she frequently makes poor food choices, she has problems with excessive hunger, she frequently eats larger portions than normal, and she struggles with emotional eating.  Depression Screen Ellenore's Food and Mood (modified PHQ-9) score was 10.  Subjective:   1. Other fatigue Gwynn admits to daytime somnolence and admits to waking up still tired. Patient has a history of symptoms of daytime fatigue and morning fatigue. Nicole Burns generally gets 6 hours of sleep per night, and states that she has nightime awakenings. Snoring is present. Apneic episodes are not present. Epworth Sleepiness Score is 5.  EKG was done with Dr. Duke Salvia on 12/01/2022, normal sinus rhythm at 78 bpm.  2. SOBOE (shortness of breath on exertion) Nicole Burns notes increasing shortness of breath with exercising and seems to be worsening over  time with weight gain. She notes getting out of breath sooner with activity than she used to. This has not gotten worse recently. Nicole Burns denies shortness of breath at rest or orthopnea.  3. Type 2 diabetes mellitus with hyperglycemia, without long-term current use of insulin (HCC) Patient's recent A1c was 5.3.  She is on GLP-1 or GLP-1/GLP.  Her next eye exam is scheduled for  September.  She is currently on Mounjaro and she is on stool softener.  4. Hyperlipidemia associated with type 2 diabetes mellitus (HCC) Patient's last LDL was elevated at her last appointment.  She was on rosuvastatin.  5. Hypertension associated with diabetes (HCC) Patient's blood pressure is controlled today.  6. Iron deficiency anemia due to chronic blood loss Patient previously got iron infusions.  Her last iron level was on the low end of normal.  She has been anemic for years.  7. Coronary artery disease, unspecified vessel or lesion type, unspecified whether angina present, unspecified whether native or transplanted heart Patient has a history of preterm preeclampsia with her first pregnancy.  She voices her second pregnancy was normal.  8. At increased risk for cardiovascular disease Nicole Burns is at higher than average risk for cardiovascular disease due to obesity.   Assessment/Plan:   1. Other fatigue Nicole Burns does feel that her weight is causing her energy to be lower than it should be. Fatigue may be related to obesity, depression or many other causes. Labs will be ordered, and in the meanwhile, Sameeha will focus on self care including making healthy food choices, increasing physical activity and focusing on stress reduction.  - Vitamin B12 - Folate - VITAMIN D 25 Hydroxy (Vit-D Deficiency, Fractures) - TSH - T4, free - T3  2. SOBOE (shortness of breath on exertion) Nicole Burns does feel that she gets out of breath more easily that she used to when she exercises. Nicole Burns's shortness of breath appears to be obesity related and exercise induced. She has agreed to work on weight loss and gradually increase exercise to treat her exercise induced shortness of breath. Will continue to monitor closely.  3. Type 2 diabetes mellitus with hyperglycemia, without long-term current use of insulin (HCC) We will check labs today, and we will follow-up at patient's next appointment.  -  Hemoglobin A1c - Insulin, random  4. Hyperlipidemia associated with type 2 diabetes mellitus (HCC) We will check labs today, and we will follow-up at patient's next appointment.  - Lipid Panel With LDL/HDL Ratio  5. Hypertension associated with diabetes (HCC) We will check labs today, and we will follow-up at patient's next appointment.  - Comprehensive metabolic panel  6. Iron deficiency anemia due to chronic blood loss We will check labs today, and we will follow-up at patient's next appointment.  - CBC with Differential/Platelet  7. Coronary artery disease, unspecified vessel or lesion type, unspecified whether angina present, unspecified whether native or transplanted heart We discussed significant increase in risk of stroke and heart attack due to preterm preeclampsia.  Positively she is already seeing a Cardiologist.  8. At increased risk for cardiovascular disease Nicole Burns was given approximately 6 minutes of coronary artery disease prevention counseling today. She is 47 y.o. female and has risk factors for heart disease including obesity. We discussed intensive lifestyle modifications today with an emphasis on specific weight loss instructions and strategies.  Repetitive spaced learning was employed today to elicit superior memory formation and behavioral change.   9. Depression screening Nicole Burns had a positive depression screening. Depression is  commonly associated with obesity and often results in emotional eating behaviors. We will monitor this closely and work on CBT to help improve the non-hunger eating patterns. Referral to Psychology may be required if no improvement is seen as she continues in our clinic.  10. BMI 40.0-44.9, adult (HCC)  11. Obesity with starting BMI of 41.8 Nicole Burns is currently in the action stage of change and her goal is to continue with weight loss efforts. I recommend Nicole Burns begin the structured treatment plan as follows:  She has agreed to the  Category 3 Plan.  Exercise goals: No exercise has been prescribed at this time.   Behavioral modification strategies: increasing lean protein intake, meal planning and cooking strategies, keeping healthy foods in the home, and planning for success.  She was informed of the importance of frequent follow-up visits to maximize her success with intensive lifestyle modifications for her multiple health conditions. She was informed we would discuss her lab results at her next visit unless there is a critical issue that needs to be addressed sooner. Nicole Burns agreed to keep her next visit at the agreed upon time to discuss these results.  Objective:   Blood pressure 130/85, pulse 78, temperature 97.9 F (36.6 C), height 5\' 4"  (1.626 m), weight 243 lb (110.2 kg), last menstrual period 02/20/2023, SpO2 100%. Body mass index is 41.71 kg/m.  EKG: Normal sinus rhythm, rate 78 BPM.  Indirect Calorimeter completed today shows a VO2 of 275 and a REE of 1901.  Her calculated basal metabolic rate is 1610 thus her basal metabolic rate is better than expected.  General: Cooperative, alert, well developed, in no acute distress. HEENT: Conjunctivae and lids unremarkable. Cardiovascular: Regular rhythm.  Lungs: Normal work of breathing. Neurologic: No focal deficits.   Lab Results  Component Value Date   CREATININE 0.99 03/05/2023   BUN 11 03/05/2023   NA 138 03/05/2023   K 4.5 03/05/2023   CL 103 03/05/2023   CO2 23 03/05/2023   Lab Results  Component Value Date   ALT 15 03/05/2023   AST 17 03/05/2023   ALKPHOS 83 03/05/2023   BILITOT 0.4 03/05/2023   Lab Results  Component Value Date   HGBA1C 6.1 (H) 03/05/2023   HGBA1C 5.3 07/24/2022   HGBA1C 5.4 04/15/2022   HGBA1C 5.4 11/15/2021   HGBA1C 5.7 05/20/2021   Lab Results  Component Value Date   INSULIN 16.6 03/05/2023   Lab Results  Component Value Date   TSH 1.770 03/05/2023   Lab Results  Component Value Date   CHOL 167 03/05/2023    HDL 45 03/05/2023   LDLCALC 104 (H) 03/05/2023   LDLDIRECT 102.0 07/24/2022   TRIG 100 03/05/2023   CHOLHDL 3.5 12/01/2022   Lab Results  Component Value Date   WBC 7.8 03/05/2023   HGB 9.1 (L) 03/05/2023   HCT 31.0 (L) 03/05/2023   MCV 83 03/05/2023   PLT 487 (H) 03/05/2023   Lab Results  Component Value Date   IRON 31 11/10/2022   TIBC 347 11/10/2022   FERRITIN 11 11/10/2022   Attestation Statements:   Reviewed by clinician on day of visit: allergies, medications, problem list, medical history, surgical history, family history, social history, and previous encounter notes.   I, Burt Knack, am acting as transcriptionist for Reuben Likes, MD.  This is the patient's first visit at [facility]. The patient's NEW PATIENT PACKET was reviewed at length. Included in the packet: current and past health history, medications, allergies,  ROS, gynecologic history (women only), surgical history, family history, social history, weight history, weight loss surgery history (for those that have had weight loss surgery), nutritional evaluation, mood and food questionnaire, PHQ9, Epworth questionnaire, sleep habits questionnaire, patient life and health improvement goals questionnaire. These will all be scanned into the patient's chart under media.   During the visit, I independently reviewed the patient's EKG, bioimpedance scale results, and indirect calorimeter results. I used this information to tailor a meal plan for the patient that will help her to lose weight and will improve her obesity-related conditions going forward. I performed a medically necessary appropriate examination and/or evaluation. I discussed the assessment and treatment plan with the patient. The patient was provided an opportunity to ask questions and all were answered. The patient agreed with the plan and demonstrated an understanding of the instructions. Labs were ordered at this visit and will be  reviewed at the next visit unless more critical results need to be addressed immediately. Clinical information was updated and documented in the EMR.    I have reviewed the above documentation for accuracy and completeness, and I agree with the above. - Reuben Likes, MD

## 2023-03-06 ENCOUNTER — Encounter: Payer: Self-pay | Admitting: Oncology

## 2023-03-06 LAB — CBC WITH DIFFERENTIAL/PLATELET
Basophils Absolute: 0.1 10*3/uL (ref 0.0–0.2)
Basos: 1 %
EOS (ABSOLUTE): 0.2 10*3/uL (ref 0.0–0.4)
Eos: 3 %
Hematocrit: 31 % — ABNORMAL LOW (ref 34.0–46.6)
Hemoglobin: 9.1 g/dL — ABNORMAL LOW (ref 11.1–15.9)
Immature Grans (Abs): 0 10*3/uL (ref 0.0–0.1)
Immature Granulocytes: 0 %
Lymphocytes Absolute: 2.2 10*3/uL (ref 0.7–3.1)
Lymphs: 29 %
MCH: 24.4 pg — ABNORMAL LOW (ref 26.6–33.0)
MCHC: 29.4 g/dL — ABNORMAL LOW (ref 31.5–35.7)
MCV: 83 fL (ref 79–97)
Monocytes Absolute: 0.9 10*3/uL (ref 0.1–0.9)
Monocytes: 12 %
Neutrophils Absolute: 4.4 10*3/uL (ref 1.4–7.0)
Neutrophils: 55 %
Platelets: 487 10*3/uL — ABNORMAL HIGH (ref 150–450)
RBC: 3.73 x10E6/uL — ABNORMAL LOW (ref 3.77–5.28)
RDW: 14.1 % (ref 11.7–15.4)
WBC: 7.8 10*3/uL (ref 3.4–10.8)

## 2023-03-06 LAB — COMPREHENSIVE METABOLIC PANEL
ALT: 15 IU/L (ref 0–32)
AST: 17 IU/L (ref 0–40)
Albumin: 4.4 g/dL (ref 3.9–4.9)
Alkaline Phosphatase: 83 IU/L (ref 44–121)
BUN/Creatinine Ratio: 11 (ref 9–23)
BUN: 11 mg/dL (ref 6–24)
Bilirubin Total: 0.4 mg/dL (ref 0.0–1.2)
CO2: 23 mmol/L (ref 20–29)
Calcium: 9.9 mg/dL (ref 8.7–10.2)
Chloride: 103 mmol/L (ref 96–106)
Creatinine, Ser: 0.99 mg/dL (ref 0.57–1.00)
Globulin, Total: 3.1 g/dL (ref 1.5–4.5)
Glucose: 101 mg/dL — ABNORMAL HIGH (ref 70–99)
Potassium: 4.5 mmol/L (ref 3.5–5.2)
Sodium: 138 mmol/L (ref 134–144)
Total Protein: 7.5 g/dL (ref 6.0–8.5)
eGFR: 70 mL/min/{1.73_m2} (ref >59)

## 2023-03-06 LAB — TSH: TSH: 1.77 u[IU]/mL (ref 0.450–4.500)

## 2023-03-06 LAB — T3: T3, Total: 147 ng/dL (ref 71–180)

## 2023-03-06 LAB — HEMOGLOBIN A1C
Est. average glucose Bld gHb Est-mCnc: 128 mg/dL
Hgb A1c MFr Bld: 6.1 % — ABNORMAL HIGH (ref 4.8–5.6)

## 2023-03-06 LAB — T4, FREE: Free T4: 1.41 ng/dL (ref 0.82–1.77)

## 2023-03-06 LAB — LIPID PANEL WITH LDL/HDL RATIO
Cholesterol, Total: 167 mg/dL (ref 100–199)
HDL: 45 mg/dL (ref >39)
LDL Chol Calc (NIH): 104 mg/dL — ABNORMAL HIGH (ref 0–99)
LDL/HDL Ratio: 2.3 ratio (ref 0.0–3.2)
Triglycerides: 100 mg/dL (ref 0–149)
VLDL Cholesterol Cal: 18 mg/dL (ref 5–40)

## 2023-03-06 LAB — INSULIN, RANDOM: INSULIN: 16.6 u[IU]/mL (ref 2.6–24.9)

## 2023-03-06 LAB — FOLATE: Folate: 7.6 ng/mL (ref >3.0)

## 2023-03-06 LAB — VITAMIN B12: Vitamin B-12: 533 pg/mL (ref 232–1245)

## 2023-03-06 LAB — VITAMIN D 25 HYDROXY (VIT D DEFICIENCY, FRACTURES): Vit D, 25-Hydroxy: 36.5 ng/mL (ref 30.0–100.0)

## 2023-03-07 ENCOUNTER — Other Ambulatory Visit: Payer: Self-pay | Admitting: Internal Medicine

## 2023-03-19 ENCOUNTER — Encounter (INDEPENDENT_AMBULATORY_CARE_PROVIDER_SITE_OTHER): Payer: Self-pay | Admitting: Family Medicine

## 2023-03-19 ENCOUNTER — Encounter: Payer: Self-pay | Admitting: Oncology

## 2023-03-19 ENCOUNTER — Ambulatory Visit (INDEPENDENT_AMBULATORY_CARE_PROVIDER_SITE_OTHER): Payer: BC Managed Care – PPO | Admitting: Family Medicine

## 2023-03-19 VITALS — BP 123/79 | HR 79 | Temp 98.1°F | Ht 64.0 in | Wt 239.0 lb

## 2023-03-19 DIAGNOSIS — I152 Hypertension secondary to endocrine disorders: Secondary | ICD-10-CM

## 2023-03-19 DIAGNOSIS — E1165 Type 2 diabetes mellitus with hyperglycemia: Secondary | ICD-10-CM | POA: Diagnosis not present

## 2023-03-19 DIAGNOSIS — Z6841 Body Mass Index (BMI) 40.0 and over, adult: Secondary | ICD-10-CM

## 2023-03-19 DIAGNOSIS — E1169 Type 2 diabetes mellitus with other specified complication: Secondary | ICD-10-CM | POA: Diagnosis not present

## 2023-03-19 DIAGNOSIS — E559 Vitamin D deficiency, unspecified: Secondary | ICD-10-CM

## 2023-03-19 DIAGNOSIS — E669 Obesity, unspecified: Secondary | ICD-10-CM

## 2023-03-19 DIAGNOSIS — E1159 Type 2 diabetes mellitus with other circulatory complications: Secondary | ICD-10-CM

## 2023-03-19 DIAGNOSIS — E785 Hyperlipidemia, unspecified: Secondary | ICD-10-CM

## 2023-03-19 DIAGNOSIS — D500 Iron deficiency anemia secondary to blood loss (chronic): Secondary | ICD-10-CM

## 2023-03-19 DIAGNOSIS — Z7985 Long-term (current) use of injectable non-insulin antidiabetic drugs: Secondary | ICD-10-CM

## 2023-03-19 MED ORDER — VITAMIN D (ERGOCALCIFEROL) 1.25 MG (50000 UNIT) PO CAPS
50000.0000 [IU] | ORAL_CAPSULE | ORAL | 0 refills | Status: DC
Start: 2023-03-19 — End: 2023-04-09

## 2023-03-19 NOTE — Progress Notes (Unsigned)
Chief Complaint:   OBESITY Nicole Burns is here to discuss her progress with her obesity treatment plan along with follow-up of her obesity related diagnoses. Dani is on the Category 3 Plan and states she is following her eating plan approximately 90% of the time. Penni states she is walking 5,000 steps 5 times per week.  Today's visit was #: 2 Starting weight: 243 lbs Starting date: 03/05/2023 Today's weight: 239 lbs Today's date: 03/19/2023 Total lbs lost to date: 4 Total lbs lost since last in-office visit: 4  Interim History: First follow up appointment.  Mentions by day 5 she felt she had to force herself to eat and her body was rejecting the food.  She prepped the food and realized how much food she was going to eat on the meal plan.  She feels like she has to retrain her brain to know she has to eat to lose. She is working on getting all her protein in.  She felt like by the time she had an afternoon snack she wasn't hungry for supper. No plans for upcoming weekend except staying home and resting and relaxing.  She mentions she is gong on a cruise in October.  Wondering about alternative breakfast options.  Subjective:   1. Hypertension associated with diabetes (HCC) Patient's blood pressure is well-controlled today.  She is on amlodipine and spironolactone.  Her electrolytes are all within normal limits.  2. Type 2 diabetes mellitus with hyperglycemia, without long-term current use of insulin (HCC) Patient's A1c is 6.1 and insulin 16.6.  She is on Mounjaro 2.5 mg subcu weekly.  I discussed labs with the patient today.  3. Hyperlipidemia associated with type 2 diabetes mellitus (HCC) Patient is on Crestor 5 mg daily.  Her LDL was 104, HDL 45, and triglycerides 604.  I discussed labs with the patient today.  4. Iron deficiency anemia due to chronic blood loss Patient is on OTC iron, and her levels are worsening.  Her MCV is 83, H/H low at 9.1/31.0, and RBC 3.73.  I discussed  labs with the patient today.  5. Vitamin D deficiency Patient is on D-K supplement, and she notes fatigue.  She is on 5,000 IU daily.  Assessment/Plan:   1. Hypertension associated with diabetes (HCC) We will continue to monitor patient's blood pressure at her follow-up visits with a goal to decrease amlodipine.  2. Type 2 diabetes mellitus with hyperglycemia, without long-term current use of insulin (HCC) Patient will continue Mounjaro at her current dose with no change in treatment.  3. Hyperlipidemia associated with type 2 diabetes mellitus (HCC) Patient will continue Crestor; not at goal.  We will repeat labs in January.  4. Iron deficiency anemia due to chronic blood loss Patient will continue OTC iron; she was encouraged to follow-up with her GYN to discuss possible IUD to help with heavy menses.  5. Vitamin D deficiency Patient is to stop OTC supplementation; and start prescription vitamin D 50,000 IU once weekly with no refills.  - Vitamin D, Ergocalciferol, (DRISDOL) 1.25 MG (50000 UNIT) CAPS capsule; Take 1 capsule (50,000 Units total) by mouth every 7 (seven) days.  Dispense: 4 capsule; Refill: 0  6. BMI 40.0-44.9, adult (HCC)  7. Obesity with starting BMI of 41.8 Lance is currently in the action stage of change. As such, her goal is to continue with weight loss efforts. She has agreed to the Category 3 Plan.   Exercise goals: All adults should avoid inactivity. Some physical activity is  better than none, and adults who participate in any amount of physical activity gain some health benefits.  Behavioral modification strategies: increasing lean protein intake, meal planning and cooking strategies, keeping healthy foods in the home, and planning for success.  Ariannah has agreed to follow-up with our clinic in 3 weeks. She was informed of the importance of frequent follow-up visits to maximize her success with intensive lifestyle modifications for her multiple health  conditions.   Objective:   Blood pressure 123/79, pulse 79, temperature 98.1 F (36.7 C), height 5\' 4"  (1.626 m), weight 239 lb (108.4 kg), last menstrual period 02/20/2023, SpO2 100%. Body mass index is 41.02 kg/m.  General: Cooperative, alert, well developed, in no acute distress. HEENT: Conjunctivae and lids unremarkable. Cardiovascular: Regular rhythm.  Lungs: Normal work of breathing. Neurologic: No focal deficits.   Lab Results  Component Value Date   CREATININE 0.99 03/05/2023   BUN 11 03/05/2023   NA 138 03/05/2023   K 4.5 03/05/2023   CL 103 03/05/2023   CO2 23 03/05/2023   Lab Results  Component Value Date   ALT 15 03/05/2023   AST 17 03/05/2023   ALKPHOS 83 03/05/2023   BILITOT 0.4 03/05/2023   Lab Results  Component Value Date   HGBA1C 6.1 (H) 03/05/2023   HGBA1C 5.3 07/24/2022   HGBA1C 5.4 04/15/2022   HGBA1C 5.4 11/15/2021   HGBA1C 5.7 05/20/2021   Lab Results  Component Value Date   INSULIN 16.6 03/05/2023   Lab Results  Component Value Date   TSH 1.770 03/05/2023   Lab Results  Component Value Date   CHOL 167 03/05/2023   HDL 45 03/05/2023   LDLCALC 104 (H) 03/05/2023   LDLDIRECT 102.0 07/24/2022   TRIG 100 03/05/2023   CHOLHDL 3.5 12/01/2022   Lab Results  Component Value Date   VD25OH 36.5 03/05/2023   Lab Results  Component Value Date   WBC 7.8 03/05/2023   HGB 9.1 (L) 03/05/2023   HCT 31.0 (L) 03/05/2023   MCV 83 03/05/2023   PLT 487 (H) 03/05/2023   Lab Results  Component Value Date   IRON 31 11/10/2022   TIBC 347 11/10/2022   FERRITIN 11 11/10/2022   Attestation Statements:   Reviewed by clinician on day of visit: allergies, medications, problem list, medical history, surgical history, family history, social history, and previous encounter notes.  Time spent on visit including pre-visit chart review and post-visit care and charting was 45 minutes.   I, Burt Knack, am acting as transcriptionist for Reuben Likes, MD.  I have reviewed the above documentation for accuracy and completeness, and I agree with the above. - Reuben Likes, MD

## 2023-03-21 LAB — HM DIABETES EYE EXAM

## 2023-03-24 ENCOUNTER — Encounter: Payer: Self-pay | Admitting: Internal Medicine

## 2023-04-09 ENCOUNTER — Encounter (INDEPENDENT_AMBULATORY_CARE_PROVIDER_SITE_OTHER): Payer: Self-pay | Admitting: Family Medicine

## 2023-04-09 ENCOUNTER — Ambulatory Visit (INDEPENDENT_AMBULATORY_CARE_PROVIDER_SITE_OTHER): Payer: Managed Care, Other (non HMO) | Admitting: Family Medicine

## 2023-04-09 VITALS — BP 129/85 | HR 89 | Temp 98.4°F | Ht 64.0 in | Wt 239.0 lb

## 2023-04-09 DIAGNOSIS — E1165 Type 2 diabetes mellitus with hyperglycemia: Secondary | ICD-10-CM

## 2023-04-09 DIAGNOSIS — Z7985 Long-term (current) use of injectable non-insulin antidiabetic drugs: Secondary | ICD-10-CM

## 2023-04-09 DIAGNOSIS — E559 Vitamin D deficiency, unspecified: Secondary | ICD-10-CM | POA: Diagnosis not present

## 2023-04-09 DIAGNOSIS — Z6841 Body Mass Index (BMI) 40.0 and over, adult: Secondary | ICD-10-CM

## 2023-04-09 MED ORDER — VITAMIN D (ERGOCALCIFEROL) 1.25 MG (50000 UNIT) PO CAPS
50000.0000 [IU] | ORAL_CAPSULE | ORAL | 0 refills | Status: DC
Start: 2023-04-09 — End: 2023-05-04

## 2023-04-09 NOTE — Progress Notes (Signed)
Carlye Grippe, D.O.  ABFM, ABOM Specializing in Clinical Bariatric Medicine  Office located at: [address] [place], [place]  [address]     Assessment and Plan:   Medications Discontinued During This Encounter  Medication Reason   Vitamin D, Ergocalciferol, (DRISDOL) 1.25 MG (50000 UNIT) CAPS capsule Reorder     Meds ordered this encounter  Medications   Vitamin D, Ergocalciferol, (DRISDOL) 1.25 MG (50000 UNIT) CAPS capsule    Sig: Take 1 capsule (50,000 Units total) by mouth every 7 (seven) days.    Dispense:  4 capsule    Refill:  0     Type 2 diabetes mellitus with hyperglycemia, without long-term current use of insulin (HCC) Assessment & Plan: Lab Results  Component Value Date   HGBA1C 6.1 (H) 03/05/2023   HGBA1C 5.3 07/24/2022   HGBA1C 5.4 04/15/2022   INSULIN 16.6 03/05/2023    Reports having a strong hx of diabetes on her paternal side. Pt had an elevated A1c of 7.0 on 02/07/21. T2DM currently treated with Mounjaro 2.5 mg once a wk - tolerating well, denies any N/V/D. Started Mounjaro per PCP roughly 2 mos ago. Pt does not check sugars at home- denies any symptoms of lows/highs. Was previously on Ozempic, however med was discontinued due to "mood swings". Her cravings are well controlled. Reports sometimes having to "force herself to eat" because she does not feel hungry. Continue to decrease simple carbs/ sugars; increase fiber and proteins -> follow her meal plan. C/w Mounjaro as directed by PCP.   Vitamin D deficiency Assessment & Plan: Lab Results  Component Value Date   VD25OH 36.5 03/05/2023   Pt endorses not consistently taking ERGO 50,000 units once a wk. I briefly discussed the importance of vitamin D to the patient's health and well-being as well as to their ability to lose weight. Pt agrees to be more consistent with taking her high dose vitamin D- Will refill ERGO today.     BMI 40.0-44.9, adult (HCC)- current BMI 41 Morbid obesity  (HCC) Assessment & Plan: Since last office visit on 03/19/23 patient's muscle mass has increased by 0.2 lb. Fat mass has decreased by 0.6 lb. Total body water has increased by 0.4 lb.  Counseling done on how various foods will affect these numbers and how to maximize success  Total lbs lost to date: 4 lbs  Total weight loss percentage to date: 1.65%  No change to meal plan - see Subjective   Discussed moving her lean proteins at dinner to lunch and breakfast to get in all protein by the end of the day.   I reviewed different protein sources that pt can incorporate into her diet plan: yogurt, cottage cheese, fair life, egg whites, etc..   Behavioral Intervention Additional resources provided today: Category 3 meal plan information Evidence-based interventions for health behavior change were utilized today including the discussion of self monitoring techniques, problem-solving barriers and SMART goal setting techniques.   Regarding patient's less desirable eating habits and patterns, we employed the technique of small changes.  Pt will specifically work on: increasing lean protein intake for next visit.    FOLLOW UP: Return 2-3 wks. She was informed of the importance of frequent follow up visits to maximize her success with intensive lifestyle modifications for her multiple health conditions.  Subjective:   Chief complaint: Obesity Semra is here to discuss her progress with her obesity treatment plan. She is on the Category 3 Plan and states she is  following her eating plan approximately 80% of the time. She states she is not exercising.  Interval History:  Ulonda Polin is here for a follow up office visit. This is my first time meeting with Azlee; she typically sees Dr.Ukleja.Since last OV, Tracye has been doing well. Reports eating the majority of the food on the meal plan. Specifically struggles with eating 8-10 ounces of lean protein at dinner.  Reports sometimes having to  "force herself to eat" because she does not feel hungry. Has been snacking less. Has an upcoming cruise trip on October 5th.   Pharmacotherapy for weight loss: She is currently taking  Mounjaro 2.5 mg once a wk  for medical weight loss.  Denies side effects.    Review of Systems:  Pertinent positives were addressed with patient today.  Reviewed by clinician on day of visit: allergies, medications, problem list, medical history, surgical history, family history, social history, and previous encounter notes.  Weight Summary and Biometrics   Weight Lost Since Last Visit: 0lb  Weight Gained Since Last Visit: 0lb   Vitals Temp: 98.4 F (36.9 C) BP: 129/85 Pulse Rate: 89 SpO2: 99 %   Anthropometric Measurements Height: 5\' 4"  (1.626 m) Weight: 239 lb (108.4 kg) BMI (Calculated): 41 Weight at Last Visit: 239lb Weight Lost Since Last Visit: 0lb Weight Gained Since Last Visit: 0lb Starting Weight: 243lb Total Weight Loss (lbs): 4 lb (1.814 kg) Peak Weight: 243lb   Body Composition  Body Fat %: 39.5 % Fat Mass (lbs): 94.4 lbs Muscle Mass (lbs): 137.4 lbs Total Body Water (lbs): 94.2 lbs Visceral Fat Rating : 12   Other Clinical Data Fasting: no Labs: no Today's Visit #: 3 Starting Date: 03/05/23    Objective:   PHYSICAL EXAM: Blood pressure 129/85, pulse 89, temperature 98.4 F (36.9 C), height 5\' 4"  (1.626 m), weight 239 lb (108.4 kg), SpO2 99%. Body mass index is 41.02 kg/m.  General: Well Developed, well nourished, and in no acute distress.  HEENT: Normocephalic, atraumatic Skin: Warm and dry, cap RF less 2 sec, good turgor Chest:  Normal excursion, shape, no gross abn Respiratory: speaking in full sentences, no conversational dyspnea NeuroM-Sk: Ambulates w/o assistance, moves * 4 Psych: A and O *3, insight good, mood-full  DIAGNOSTIC DATA REVIEWED:  BMET    Component Value Date/Time   NA 138 03/05/2023 0903   NA 138 07/15/2012 1451   K 4.5 03/05/2023  0903   K 3.7 07/15/2012 1451   CL 103 03/05/2023 0903   CL 106 07/15/2012 1451   CO2 23 03/05/2023 0903   CO2 25 07/15/2012 1451   GLUCOSE 101 (H) 03/05/2023 0903   GLUCOSE 104 (H) 07/24/2022 1045   GLUCOSE 92 07/15/2012 1451   BUN 11 03/05/2023 0903   BUN 7 07/15/2012 1451   CREATININE 0.99 03/05/2023 0903   CREATININE 0.93 08/08/2013 1131   CALCIUM 9.9 03/05/2023 0903   CALCIUM 8.9 07/15/2012 1451   GFRNONAA >60 07/13/2019 1419   GFRNONAA >60 07/15/2012 1451   GFRAA >60 07/13/2019 1419   GFRAA >60 07/15/2012 1451   Lab Results  Component Value Date   HGBA1C 6.1 (H) 03/05/2023   HGBA1C 6.1 10/27/2012   Lab Results  Component Value Date   INSULIN 16.6 03/05/2023   Lab Results  Component Value Date   TSH 1.770 03/05/2023   CBC    Component Value Date/Time   WBC 7.8 03/05/2023 0903   WBC 8.8 11/10/2022 1426   RBC 3.73 (L) 03/05/2023 1610  RBC 3.86 (L) 11/10/2022 1426   HGB 9.1 (L) 03/05/2023 0903   HCT 31.0 (L) 03/05/2023 0903   PLT 487 (H) 03/05/2023 0903   MCV 83 03/05/2023 0903   MCV 83 07/15/2012 1451   MCH 24.4 (L) 03/05/2023 0903   MCH 27.2 11/10/2022 1426   MCHC 29.4 (L) 03/05/2023 0903   MCHC 30.7 11/10/2022 1426   RDW 14.1 03/05/2023 0903   RDW 15.0 (H) 07/15/2012 1451   Iron Studies    Component Value Date/Time   IRON 31 11/10/2022 1426   TIBC 347 11/10/2022 1426   FERRITIN 11 11/10/2022 1426   IRONPCTSAT 9 (L) 11/10/2022 1426   IRONPCTSAT 4 (L) 11/15/2021 0859   Lipid Panel     Component Value Date/Time   CHOL 167 03/05/2023 0903   TRIG 100 03/05/2023 0903   HDL 45 03/05/2023 0903   CHOLHDL 3.5 12/01/2022 0924   CHOLHDL 3 07/24/2022 1045   VLDL 14.2 07/24/2022 1045   LDLCALC 104 (H) 03/05/2023 0903   LDLDIRECT 102.0 07/24/2022 1045   Hepatic Function Panel     Component Value Date/Time   PROT 7.5 03/05/2023 0903   PROT 8.1 07/15/2012 1451   ALBUMIN 4.4 03/05/2023 0903   ALBUMIN 3.5 07/15/2012 1451   AST 17 03/05/2023 0903    AST 19 07/15/2012 1451   ALT 15 03/05/2023 0903   ALT 23 07/15/2012 1451   ALKPHOS 83 03/05/2023 0903   ALKPHOS 81 07/15/2012 1451   BILITOT 0.4 03/05/2023 0903   BILITOT 0.4 07/15/2012 1451   BILIDIR 0.1 01/14/2013 1040      Component Value Date/Time   TSH 1.770 03/05/2023 1610   Nutritional Lab Results  Component Value Date   VD25OH 36.5 03/05/2023    Attestations:   I, Special Puri, acting as a Stage manager for Thomasene Lot, DO., have compiled all relevant documentation for today's office visit on behalf of Thomasene Lot, DO, while in the presence of [employer], DO.  I have reviewed the above documentation for accuracy and completeness, and I agree with the above. Carlye Grippe, D.O.  The 21st Century Cures Act was signed into law in 2016 which includes the topic of electronic health records.  This provides immediate access to information in MyChart.  This includes consultation notes, operative notes, office notes, lab results and pathology reports.  If you have any questions about what you read please let us know at your next visit so we can discuss your concerns and take corrective action if need be.  We are right here with you.

## 2023-04-24 ENCOUNTER — Ambulatory Visit: Payer: Managed Care, Other (non HMO) | Admitting: Internal Medicine

## 2023-05-04 ENCOUNTER — Ambulatory Visit (INDEPENDENT_AMBULATORY_CARE_PROVIDER_SITE_OTHER): Payer: Managed Care, Other (non HMO) | Admitting: Family Medicine

## 2023-05-04 ENCOUNTER — Encounter (INDEPENDENT_AMBULATORY_CARE_PROVIDER_SITE_OTHER): Payer: Self-pay | Admitting: Family Medicine

## 2023-05-04 VITALS — BP 123/83 | HR 84 | Temp 98.0°F | Ht 64.0 in | Wt 244.0 lb

## 2023-05-04 DIAGNOSIS — E669 Obesity, unspecified: Secondary | ICD-10-CM | POA: Diagnosis not present

## 2023-05-04 DIAGNOSIS — Z6841 Body Mass Index (BMI) 40.0 and over, adult: Secondary | ICD-10-CM

## 2023-05-04 DIAGNOSIS — E559 Vitamin D deficiency, unspecified: Secondary | ICD-10-CM | POA: Diagnosis not present

## 2023-05-04 DIAGNOSIS — D508 Other iron deficiency anemias: Secondary | ICD-10-CM | POA: Diagnosis not present

## 2023-05-04 MED ORDER — VITAMIN D (ERGOCALCIFEROL) 1.25 MG (50000 UNIT) PO CAPS
50000.0000 [IU] | ORAL_CAPSULE | ORAL | 0 refills | Status: DC
Start: 2023-05-04 — End: 2023-06-22
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
## 2023-05-11 ENCOUNTER — Other Ambulatory Visit: Payer: Self-pay

## 2023-05-11 DIAGNOSIS — D500 Iron deficiency anemia secondary to blood loss (chronic): Secondary | ICD-10-CM

## 2023-05-12 ENCOUNTER — Inpatient Hospital Stay: Payer: Managed Care, Other (non HMO) | Attending: Oncology

## 2023-05-12 ENCOUNTER — Inpatient Hospital Stay: Payer: Managed Care, Other (non HMO) | Admitting: Oncology

## 2023-05-18 ENCOUNTER — Encounter: Payer: Self-pay | Admitting: Oncology

## 2023-05-18 ENCOUNTER — Inpatient Hospital Stay: Payer: Managed Care, Other (non HMO)

## 2023-05-18 ENCOUNTER — Inpatient Hospital Stay: Payer: Managed Care, Other (non HMO) | Admitting: Oncology

## 2023-05-25 ENCOUNTER — Encounter (INDEPENDENT_AMBULATORY_CARE_PROVIDER_SITE_OTHER): Payer: Self-pay

## 2023-05-25 ENCOUNTER — Ambulatory Visit (INDEPENDENT_AMBULATORY_CARE_PROVIDER_SITE_OTHER): Payer: Managed Care, Other (non HMO) | Admitting: Family Medicine

## 2023-05-26 ENCOUNTER — Ambulatory Visit: Payer: Managed Care, Other (non HMO) | Admitting: Internal Medicine

## 2023-06-07 ENCOUNTER — Other Ambulatory Visit (INDEPENDENT_AMBULATORY_CARE_PROVIDER_SITE_OTHER): Payer: Self-pay | Admitting: Family Medicine

## 2023-06-07 DIAGNOSIS — E559 Vitamin D deficiency, unspecified: Secondary | ICD-10-CM

## 2023-06-12 ENCOUNTER — Other Ambulatory Visit: Payer: Self-pay | Admitting: Internal Medicine

## 2023-06-13 ENCOUNTER — Other Ambulatory Visit: Payer: Self-pay | Admitting: Internal Medicine

## 2023-06-15 ENCOUNTER — Ambulatory Visit: Payer: Managed Care, Other (non HMO) | Admitting: Family Medicine

## 2023-06-15 NOTE — Telephone Encounter (Signed)
LMTCB. Need to find out if pt is still following with Dr. Darrick Huntsman

## 2023-06-15 NOTE — Telephone Encounter (Signed)
LMTCB. Need to find out if pt is still following with Dr. Darrick Huntsman if so is she still taking the [facility].

## 2023-06-17 NOTE — Telephone Encounter (Signed)
LKMTCB

## 2023-06-17 NOTE — Telephone Encounter (Signed)
LMTCB

## 2023-06-22 ENCOUNTER — Ambulatory Visit: Payer: Managed Care, Other (non HMO) | Admitting: Bariatrics

## 2023-06-22 ENCOUNTER — Ambulatory Visit (INDEPENDENT_AMBULATORY_CARE_PROVIDER_SITE_OTHER): Payer: Managed Care, Other (non HMO) | Admitting: Family Medicine

## 2023-06-22 ENCOUNTER — Encounter (INDEPENDENT_AMBULATORY_CARE_PROVIDER_SITE_OTHER): Payer: Self-pay | Admitting: Family Medicine

## 2023-06-22 VITALS — BP 148/86 | HR 75 | Temp 98.1°F | Ht 64.0 in | Wt 243.0 lb

## 2023-06-22 DIAGNOSIS — E559 Vitamin D deficiency, unspecified: Secondary | ICD-10-CM | POA: Insufficient documentation

## 2023-06-22 DIAGNOSIS — Z6841 Body Mass Index (BMI) 40.0 and over, adult: Secondary | ICD-10-CM

## 2023-06-22 DIAGNOSIS — E669 Obesity, unspecified: Secondary | ICD-10-CM

## 2023-06-22 DIAGNOSIS — E1165 Type 2 diabetes mellitus with hyperglycemia: Secondary | ICD-10-CM | POA: Insufficient documentation

## 2023-06-22 DIAGNOSIS — Z7985 Long-term (current) use of injectable non-insulin antidiabetic drugs: Secondary | ICD-10-CM

## 2023-06-22 MED ORDER — VITAMIN D (ERGOCALCIFEROL) 1.25 MG (50000 UNIT) PO CAPS: 50000.0000 [IU] | ORAL_CAPSULE | ORAL | 0 refills | Status: DC

## 2023-06-22 MED ORDER — MOUNJARO 2.5 MG/0.5ML ~~LOC~~ SOAJ
2.5000 mg | SUBCUTANEOUS | 2 refills | Status: DC
Start: 2023-06-22 — End: 2023-07-27

## 2023-06-22 NOTE — Progress Notes (Signed)
SUBJECTIVE:  Chief Complaint: Obesity  Interim History: Patient had GI illness at time of last appointment (provider had to cancel prior).  She was last seen on October 14.  She is trying to survive and get back into the swing of life.  After the cruise she is really trying to get back into her routine.  She did have a GI virus for a week recently.  She is now working back to eating normally.  Over the next few weeks she has some emotions she is managing like grief, and stress.  She is figuring out what she and her  family will be doing for Christmas.   Nicole Burns is here to discuss her progress with her obesity treatment plan. She is on the Category 3 Plan and states she is following her eating plan approximately 25 % of the time. She states she is not exercising.   OBJECTIVE: Visit Diagnoses: Problem List Items Addressed This Visit       Endocrine   Type 2 diabetes mellitus with hyperglycemia (HCC)    Patient doing well on Mounjaro.  She is feeling some GI side effects so is taking the medication every 2 weeks. She needs a refill today. Last A1c was doing in August and was 6.1.  Refill sent in.  Needs labs at next appointment.      Relevant Medications   tirzepatide [facility]) 2.5 MG/0.5ML Pen     Other   Vitamin D deficiency    Discussed importance of vitamin d supplementation.  Vitamin d supplementation has been shown to decrease fatigue, decrease risk of progression to insulin resistance and then prediabetes, decreases risk of falling in older age and can even assist in decreasing depressive symptoms in PTSD.   Prescription for Vitamin D sent in.        Relevant Medications   Vitamin D, Ergocalciferol, (DRISDOL) 1.25 MG (50000 UNIT) CAPS capsule   Other Visit Diagnoses     Obesity with starting BMI of 41.8    -  Primary   Relevant Medications   tirzepatide (MOUNJARO) 2.5 MG/0.5ML Pen       Vitals Temp: 98.1 F (36.7 C) BP: (!) 148/86 Pulse Rate: 75 SpO2: 100  %   Anthropometric Measurements Height: 5\' 4"  (1.626 m) Weight: 243 lb (110.2 kg) BMI (Calculated): 41.69 Weight at Last Visit: 244 lb Weight Lost Since Last Visit: 1 Weight Gained Since Last Visit: 0 Starting Weight: 243 lb Total Weight Loss (lbs): 0 lb (0 kg)   Body Composition  Body Fat %: 49.1 % Fat Mass (lbs): 119.6 lbs Muscle Mass (lbs): 117.6 lbs Total Body Water (lbs): 98.4 lbs Visceral Fat Rating : 15   Other Clinical Data Today's Visit #: 5 Starting Date: 03/05/23     ASSESSMENT AND PLAN:  Diet: Nicole Burns is currently in the action stage of change. As such, her goal is to continue with weight loss efforts. She has agreed to Category 3 Plan.  She wants to get back into going to the grocery store and meal prepping.  Exercise: Nicole Burns has been instructed that some exercise is better than none for weight loss and overall health benefits.   Behavior Modification:  We discussed the following Behavioral Modification Strategies today: increasing lean protein intake, increasing vegetables, meal planning and cooking strategies, keeping healthy foods in the home, and planning for success.   No follow-ups on file.Marland Kitchen She was informed of the importance of frequent follow up visits to maximize her success with intensive lifestyle  modifications for her multiple health conditions.  Attestation Statements:   Reviewed by clinician on day of visit: allergies, medications, problem list, medical history, surgical history, family history, social history, and previous encounter notes.     Reuben Likes, MD

## 2023-06-22 NOTE — Telephone Encounter (Signed)
Prescription was refilled today by a different provider. Is it okay to refuse refill request?

## 2023-06-22 NOTE — Assessment & Plan Note (Signed)
 Discussed importance of vitamin d supplementation.  Vitamin d supplementation has been shown to decrease fatigue, decrease risk of progression to insulin resistance and then prediabetes, decreases risk of falling in older age and can even assist in decreasing depressive symptoms in PTSD.   Prescription for Vitamin D sent in.

## 2023-06-22 NOTE — Assessment & Plan Note (Signed)
Patient doing well on Mounjaro.  She is feeling some GI side effects so is taking the medication every 2 weeks. She needs a refill today. Last A1c was doing in August and was 6.1.  Refill sent in.  Needs labs at next appointment.

## 2023-06-23 NOTE — Telephone Encounter (Signed)
Spoke with pt and she stated that she is still following with Dr. Darrick Huntsman. Pt stated that she is driving and will have to call us back to schedule the appt.

## 2023-06-25 ENCOUNTER — Ambulatory Visit: Payer: Managed Care, Other (non HMO) | Admitting: Family Medicine

## 2023-07-07 ENCOUNTER — Other Ambulatory Visit: Payer: Self-pay | Admitting: Internal Medicine

## 2023-07-27 ENCOUNTER — Ambulatory Visit (INDEPENDENT_AMBULATORY_CARE_PROVIDER_SITE_OTHER): Payer: Managed Care, Other (non HMO) | Admitting: Physician Assistant

## 2023-07-27 ENCOUNTER — Encounter (INDEPENDENT_AMBULATORY_CARE_PROVIDER_SITE_OTHER): Payer: Self-pay | Admitting: Physician Assistant

## 2023-07-27 VITALS — BP 148/85 | HR 87 | Temp 98.7°F | Ht 64.0 in | Wt 243.0 lb

## 2023-07-27 DIAGNOSIS — E1165 Type 2 diabetes mellitus with hyperglycemia: Secondary | ICD-10-CM

## 2023-07-27 DIAGNOSIS — R1013 Epigastric pain: Secondary | ICD-10-CM

## 2023-07-27 DIAGNOSIS — I152 Hypertension secondary to endocrine disorders: Secondary | ICD-10-CM | POA: Diagnosis not present

## 2023-07-27 DIAGNOSIS — Z6841 Body Mass Index (BMI) 40.0 and over, adult: Secondary | ICD-10-CM

## 2023-07-27 DIAGNOSIS — E669 Obesity, unspecified: Secondary | ICD-10-CM

## 2023-07-27 DIAGNOSIS — Z7985 Long-term (current) use of injectable non-insulin antidiabetic drugs: Secondary | ICD-10-CM

## 2023-07-27 DIAGNOSIS — E1159 Type 2 diabetes mellitus with other circulatory complications: Secondary | ICD-10-CM

## 2023-07-27 DIAGNOSIS — E559 Vitamin D deficiency, unspecified: Secondary | ICD-10-CM

## 2023-07-27 MED ORDER — VITAMIN D (ERGOCALCIFEROL) 1.25 MG (50000 UNIT) PO CAPS: 50000.0000 [IU] | ORAL_CAPSULE | ORAL | 0 refills | Status: DC

## 2023-07-27 MED ORDER — PEPCID COMPLETE 10-800-165 MG PO CHEW: 1.0000 | CHEWABLE_TABLET | Freq: Two times a day (BID) | ORAL | 0 refills | Status: AC | PRN

## 2023-07-27 MED ORDER — TIRZEPATIDE 5 MG/0.5ML ~~LOC~~ SOAJ: 5.0000 mg | SUBCUTANEOUS | 0 refills | Status: DC

## 2023-07-27 NOTE — Progress Notes (Signed)
 SUBJECTIVE: Discussed the use of AI scribe software for clinical note transcription with the patient, who gave verbal consent to proceed.  Chief Complaint: Obesity  Interim History: She maintained her weight since her last visit.   The patient is a 48 year old individual with a history of type 2 diabetes, hypertension, hyperlipidemia, vitamin D  deficiency, and obesity. The patient was seen for a follow-up regarding her obesity treatment plan. She reported experiencing stomach pain and constipation after starting Mounjaro , a medication prescribed by her primary care doctor. The discomfort was described as starting off as gassy and bloated, then localizing to a specific spot in the abdomen. The discomfort was intermittent and not constant.  Previously, the patient was on Ozempic , but due to issues, the primary care doctor switched her to Mounjaro . The patient has been on the starter dose of Mounjaro  and has completed at least four of these starter doses. She also reported taking Pepto-Bismol, a probiotic, and a daily stool softener to manage the discomfort and constipation.  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 She admitted to often skipping meals, which she believes has hindered her weight loss progress. To supplement her diet, the patient has tried [employer] protein shakes and often carries them to work, especially when she knows she won't have time for breakfast.  The patient also reported starting her menstrual cycle, which she believes might have contributed to the slightly elevated blood pressure readings during the last two visits. She does not regularly check her blood pressure at home due to time constraints in the morning. However, she agreed to start checking it in the evening a few times a week.  The patient expressed a strong desire to succeed in her weight loss journey  and was open to trying different strategies to overcome the challenges she has been facing. Nicole Burns is here to discuss her progress with her obesity treatment plan. She is on the Category 3 Plan and states she is following her eating plan approximately 50 % of the time. She states she is not exercising 0 minutes 0 times per week.   OBJECTIVE: Visit Diagnoses: Problem List Items Addressed This Visit     Type 2 diabetes mellitus with hyperglycemia (HCC) - Primary   Relevant Medications   tirzepatide  (MOUNJARO ) 5 MG/0.5ML Pen   Vitamin D  deficiency   Relevant Medications   Vitamin D , Ergocalciferol , (DRISDOL ) 1.25 MG (50000 UNIT) CAPS capsule   Other Visit Diagnoses       Dyspepsia       Relevant Medications   famotidine -calcium  carbonate-magnesium hydroxide (PEPCID  COMPLETE) [phone] MG chewable tablet     Hypertension associated with diabetes (HCC)       Relevant Medications   tirzepatide  (MOUNJARO ) 5 MG/0.5ML Pen     Obesity with starting BMI of 41.8       Relevant Medications   tirzepatide  (MOUNJARO ) 5 MG/0.5ML Pen     BMI 40.0-44.9, adult (HCC) Current BMI 41.69       Relevant Medications   tirzepatide  (MOUNJARO ) 5 MG/0.5ML Pen     Obesity Currently on Mounjaro  25. Mg weekly- loading dose. Reports mild abdominal pain and constipation, likely medication-related. Previous issues with Ozempic . Discussed dietary habits, fluid and fiber intake, and potential benefits and risks of increasing Mounjaro  dose to 5 mg. - Increase Mounjaro  dose to 5 mg - Prescribe Pepcid  Complete, up to twice daily  as needed - Recommend psyllium husk capsules, start with 1-2 capsules at bedtime - Encourage 80-100 ounces of fluid intake daily - Recommend [employer] protein shakes and other high-protein foods  - Follow-up in two weeks to assess response  Type 2 Diabetes Mellitus with other specified complication, without long-term current use of insulin  HgbA1c is at goal. Last A1c was  6.1  Medication(s): Mounjaro  2.5 mg SQ weekly  Reports mild abdominal pain and constipation, likely medication-related. Previous issues with Ozempic . Discussed dietary habits, fluid and fiber intake, and potential benefits and risks of increasing Mounjaro  dose to 5 mg. Denies mass in neck, dysphagia, dyspepsia, persistent hoarseness, abdominal pain, or N/V or diarrhea. Has annual eye exam. Mood is stable.   Lab Results  Component Value Date   HGBA1C 6.1 (H) 03/05/2023   HGBA1C 5.3 07/24/2022   HGBA1C 5.4 04/15/2022   Lab Results  Component Value Date   MICROALBUR 0.8 04/15/2022   LDLCALC 104 (H) 03/05/2023   CREATININE 0.99 03/05/2023   Lab Results  Component Value Date   GFR 85.63 07/24/2022   GFR 73.63 04/15/2022   GFR 75.82 11/15/2021    Plan: Continue and increase dose Mounjaro  5.0 mg SQ weekly Monitor closely .  She is working  on nutrition plan to decrease simple carbohydrates, increase lean proteins and exercise to promote weight loss and improve glycemic control .  Dyspepsia Reports gassy and bloated feeling, particularly around Mounjaro  injection. Discussed use of Pepcid  Complete to alleviate symptoms. - Prescribe Pepcid  Complete, up to twice daily as needed Encourage regular intake of good protein source, adequate hydration and monitor on Mounjaro .   Hypertension Blood pressure slightly elevated during recent visits. On amlodipine  10 mg daily, spironolactone  25 mg daily. Discussed potential impact of Mounjaro  on blood pressure. Advised to monitor blood pressure at home and reduce salt intake. - Monitor blood pressure at home 2-3 times a week - Reassess blood pressure control at next visit Continue to work on nutrition plan to promote weight loss and improve BP control.   Vitamin D  Deficiency Vitamin D  is not at goal of 50.  Most recent vitamin D  level was 36.5. She is on  prescription ergocalciferol  50,000 IU weekly. No N/V or muscle weakness with Ergocalciferol .   Lab Results  Component Value Date   VD25OH 36.5 03/05/2023    Plan: Continue and refill  prescription ergocalciferol  50,000 IU weekly Low vitamin D  levels can be associated with adiposity and may result in leptin resistance and weight gain. Also associated with fatigue. Currently on vitamin D  supplementation without any adverse effects.  Recheck vitamin D  levels several times yearly to optimize supplementation/avoid over supplementation.   Follow-up - Follow-up appointment on August 12, 2023 at 2:30 PM.  Vitals Temp: 98.7 F (37.1 C) BP: (!) 148/85 Pulse Rate: 87 SpO2: 100 %   Anthropometric Measurements Height: 5' 4 (1.626 m) Weight: 243 lb (110.2 kg) BMI (Calculated): 41.69 Weight at Last Visit: 243 lb Weight Lost Since Last Visit: 0 Weight Gained Since Last Visit: 0 Starting Weight: 243 lb Total Weight Loss (lbs): 0 lb (0 kg)   Body Composition  Body Fat %: 47.1 % Fat Mass (lbs): 114.8 lbs Muscle Mass (lbs): 122.4 lbs Total Body Water (lbs): 96.4 lbs Visceral Fat Rating : 14   Other Clinical Data Labs: no Today's Visit #: no Starting Date: 03/05/23     ASSESSMENT AND PLAN:  Diet: Ellah is currently in the action stage of change. As such, her goal is to  continue with weight loss efforts. She has agreed to Category 3 Plan.  Exercise: Nicole Burns has been instructed that some exercise is better than none for weight loss and overall health benefits.   Behavior Modification:  We discussed the following Behavioral Modification Strategies today: increasing lean protein intake, decreasing simple carbohydrates, increasing vegetables, increase H2O intake, increase high fiber foods, no skipping meals, meal planning and cooking strategies, and planning for success. We discussed various medication options to help Nicole Burns with her weight loss efforts and we both agreed to increase Mounjaro  to 5 mg weekly.  Return in about 2 weeks (around 08/10/2023).SABRA She was  informed of the importance of frequent follow up visits to maximize her success with intensive lifestyle modifications for her multiple health conditions.  Attestation Statements:   Reviewed by clinician on day of visit: allergies, medications, problem list, medical history, surgical history, family history, social history, and previous encounter notes.   Time spent on visit including pre-visit chart review and post-visit care and charting was 49 minutes.    Suheyla Mortellaro, PA-C

## 2023-08-12 ENCOUNTER — Ambulatory Visit (INDEPENDENT_AMBULATORY_CARE_PROVIDER_SITE_OTHER): Payer: Managed Care, Other (non HMO) | Admitting: Physician Assistant

## 2023-08-12 ENCOUNTER — Encounter (INDEPENDENT_AMBULATORY_CARE_PROVIDER_SITE_OTHER): Payer: Self-pay | Admitting: Physician Assistant

## 2023-08-12 VITALS — BP 135/83 | HR 88 | Temp 99.0°F | Ht 64.0 in | Wt 242.0 lb

## 2023-08-12 DIAGNOSIS — I152 Hypertension secondary to endocrine disorders: Secondary | ICD-10-CM

## 2023-08-12 DIAGNOSIS — E1165 Type 2 diabetes mellitus with hyperglycemia: Secondary | ICD-10-CM

## 2023-08-12 DIAGNOSIS — E785 Hyperlipidemia, unspecified: Secondary | ICD-10-CM

## 2023-08-12 DIAGNOSIS — E1169 Type 2 diabetes mellitus with other specified complication: Secondary | ICD-10-CM

## 2023-08-12 DIAGNOSIS — E1159 Type 2 diabetes mellitus with other circulatory complications: Secondary | ICD-10-CM | POA: Diagnosis not present

## 2023-08-12 DIAGNOSIS — Z6841 Body Mass Index (BMI) 40.0 and over, adult: Secondary | ICD-10-CM

## 2023-08-12 DIAGNOSIS — Z7985 Long-term (current) use of injectable non-insulin antidiabetic drugs: Secondary | ICD-10-CM

## 2023-08-12 DIAGNOSIS — E669 Obesity, unspecified: Secondary | ICD-10-CM

## 2023-08-12 NOTE — Progress Notes (Signed)
SUBJECTIVE: Discussed the use of AI scribe software for clinical note transcription with the patient, who gave verbal consent to proceed.  Chief Complaint: Obesity  Interim History: She is down 1lb since her last visit.  Bio impedence scale reviewed with the patient:  Muscle mass + 2.4 lbs Adipose mass - 4.2 lbs Total body water -6 lbs.   Nicole Burns, a 48 year old female with a history of type two diabetes, hypertension, hyperlipidemia, iron deficiency anemia, and obesity, presents for a follow-up visit regarding her obesity treatment plan. She has been on Mounjaro 5mg  weekly for type two diabetes and ergocalciferol 50,000 units weekly for vitamin D deficiency.  The patient reports feeling bloated and full after starting Mounjaro, noting a significant decrease in hunger. She has been making a conscious effort to meet her protein needs, aiming for a minimum of 100 grams of protein a day. She has been incorporating eggs, meat, and protein drinks into her diet to meet this goal.  The patient has been experiencing challenges with meal planning and prepping, which she identifies as her biggest obstacle. She has been trying to incorporate more variety into her meals, including making egg cups with spinach, kale, and peppers for breakfast, and considering options like cottage cheese flatbread as a bread substitute.  The patient has been active at work, which involves a lot of movement and stair climbing. She has had an increase in muscle mass and a decrease in adipose tissue, which she attributes to her work activities.  The patient has also been experiencing some cravings, particularly for donuts and chocolate, but reports that these cravings are less intense and more easily satisfied than before starting Mounjaro. She has been trying to manage these cravings by incorporating healthier snack options like popcorn and pork rinds into her diet.  The patient's blood pressure was slightly  elevated at the time of the visit, but she did not report any rushing or stress prior to the appointment. She has been taking her medications as prescribed and has not reported any new medications.  Nicole Burns is here to discuss her progress with her obesity treatment plan. She is on the Category 3 Plan and states she is following her eating plan approximately 50 % of the time. She states she is not exercising.   OBJECTIVE: Visit Diagnoses: Problem List Items Addressed This Visit     Type 2 diabetes mellitus with hyperglycemia (HCC) - Primary   Other Visit Diagnoses       Hypertension associated with diabetes (HCC)         Hyperlipidemia associated with type 2 diabetes mellitus (HCC)         Obesity with starting BMI of 41.8         BMI 40.0-44.9, adult (HCC) Current BMI 41.5         Obesity 48 year old on Mounjaro 5 mg weekly. Reports bloating, fullness, and reduced hunger. Advised on protein intake and meal planning. Encouraged to track steps and use convenient meal options like Kevin's meals and crockpot recipes. - Continue Mounjaro 5 mg weekly - Ensure minimum of 100 grams of protein daily - Track daily steps using Apple Watch - Incorporate Kevin's meals and crockpot recipes into meal planning- Provided White chicken chili, Chicken and bean soup and Chicken Enchiladas recipes - Follow-up in 3 weeks to assess progress and adjust dosage if necessary  Type 2 Diabetes Mellitus Lab Results  Component Value Date   HGBA1C 6.1 (H) 03/05/2023   HGBA1C 5.3 07/24/2022  HGBA1C 5.4 04/15/2022   Lab Results  Component Value Date   MICROALBUR 0.8 04/15/2022   LDLCALC 104 (H) 03/05/2023   CREATININE 0.99 03/05/2023   INSULIN  Date Value Ref Range Status  03/05/2023 16.6 2.6 - 24.9 uIU/mL Final   On Mounjaro 5 mg weekly. No issues with blood glucose control. Emphasis on protein intake and meal planning for diabetes management. Some dyspepsia with Mounjaro and discussed strategies to  minimize including eating smaller more frequent meals, making sure to maintain good hydration and avoid fatty foods.  - Continue Mounjaro 5 mg weekly- just started this week. Will monitor before increasing further.  She is working  on nutrition plan to decrease simple carbohydrates, increase lean proteins and exercise to promote weight loss and improve glycemic control .   Hypertension Blood pressure borderline but improved. Goal: maintain BP <130/80 mmHg. On amlodipine 10 mg daily. Spironolactone 25 mg daily. Lasix 20 mg daily as needed for swelling in legs. No side effects.  - Monitor blood pressure regularly - Continue current antihypertensive regimen Continue to work on nutrition plan to promote weight loss and improve BP control.   Hyperlipidemia On Crestor 5 mg daily. No side effects.  The 10-year ASCVD risk score (Arnett DK, et al., 2019) is: 10.5%   Values used to calculate the score:     Age: 34 years     Sex: Female     Is Non-Hispanic African American: Yes     Diabetic: Yes     Tobacco smoker: No     Systolic Blood Pressure: 135 mmHg     Is BP treated: Yes     HDL Cholesterol: 45 mg/dL     Total Cholesterol: 167 mg/dL  Emphasis on dietary management and avoiding high-fat foods to prevent Mounjaro-related nausea. - Continue current lipid-lowering therapy - Avoid high-fat foods Continue to work on nutrition plan -decreasing simple carbohydrates, increasing lean proteins, decreasing saturated fats and cholesterol , avoiding trans fats and exercise as able to promote weight loss, improve lipids and decrease cardiovascular risks. Continue Mounjaro to lower CV risks further.    General Health Maintenance Emphasis on meal planning and preparation. Encouraged use of convenient meal options and additional breakfast choices to ensure a balanced diet with adequate protein. - Incorporate Kevin's meals and crockpot recipes into meal planning - Consider additional breakfast options  to prevent boredom - Ensure balanced diet with adequate protein intake  Follow-up - Follow-up appointment on Thursday, September 03, 2023, at 3 PM.  Vitals Temp: 99 F (37.2 C) BP: 135/83 Pulse Rate: 88 SpO2: 100 %   Anthropometric Measurements Height: 5\' 4"  (1.626 m) Weight: 242 lb (109.8 kg) BMI (Calculated): 41.52 Weight at Last Visit: 243 lb Weight Lost Since Last Visit: 1 Weight Gained Since Last Visit: 0 Starting Weight: 243 lb Total Weight Loss (lbs): 1 lb (0.454 kg)   Body Composition  Body Fat %: 45.7 % Fat Mass (lbs): 110.6 lbs Muscle Mass (lbs): 124.8 lbs Total Body Water (lbs): 90.4 lbs Visceral Fat Rating : 14   Other Clinical Data Today's Visit #: 7 Starting Date: 03/05/23     ASSESSMENT AND PLAN:  Diet: Jaleel is currently in the action stage of change. As such, her goal is to continue with weight loss efforts. She has agreed to Category 3 Plan.  Exercise: Semaja has been instructed that some exercise is better than none for weight loss and overall health benefits.   Behavior Modification:  We discussed the following Behavioral  Modification Strategies today: increasing lean protein intake, decreasing simple carbohydrates, increasing vegetables, increase H2O intake, increase high fiber foods, decreasing eating out, no skipping meals, meal planning and cooking strategies, better snacking choices, and planning for success. We discussed various medication options to help Bernestine with her weight loss efforts and we both agreed to continue Mounjaro 5 mg weekly for Type 2 diabetes management and continue to work on nutritional and behavioral strategies to promote weight loss.  .  Return in about 3 weeks (around 09/02/2023).Marland Kitchen She was informed of the importance of frequent follow up visits to maximize her success with intensive lifestyle modifications for her multiple health conditions.  Attestation Statements:   Reviewed by clinician on day of visit:  allergies, medications, problem list, medical history, surgical history, family history, social history, and previous encounter notes.   Time spent on visit including pre-visit chart review and post-visit care and charting was 38 minutes.    Mayu Ronk, PA-C

## 2023-09-01 ENCOUNTER — Ambulatory Visit
Admission: EM | Admit: 2023-09-01 | Discharge: 2023-09-01 | Disposition: A | Discharge status: Home / Self Care | Payer: Managed Care, Other (non HMO) | Attending: Emergency Medicine | Admitting: Emergency Medicine

## 2023-09-01 DIAGNOSIS — J101 Influenza due to other identified influenza virus with other respiratory manifestations: Secondary | ICD-10-CM | POA: Diagnosis not present

## 2023-09-01 LAB — POC COVID19/FLU A&B COMBO
Covid Antigen, POC: NEGATIVE
Influenza A Antigen, POC: POSITIVE — AB
Influenza B Antigen, POC: NEGATIVE

## 2023-09-01 MED ORDER — GUAIFENESIN-CODEINE 100-10 MG/5ML PO SOLN: 5.0000 mL | Freq: Four times a day (QID) | ORAL | 0 refills | Status: DC | PRN

## 2023-09-01 MED ORDER — BENZONATATE 100 MG PO CAPS: 100.0000 mg | ORAL_CAPSULE | Freq: Three times a day (TID) | ORAL | 0 refills | Status: DC

## 2023-09-01 NOTE — ED Provider Notes (Signed)
[facility]    CSN: [id number] Arrival date & time: 09/01/23  1303      History   Chief Complaint Chief Complaint  Patient presents with   Generalized Body Aches   Cough   Fever    HPI Nicole Burns is a 48 y.o. female.   Patient presents for evaluation of fever peaking at 101, sore throat, nasal congestion, rhinorrhea, left-sided ear pain, intermittent headaches and diarrhea present for 3 days.  Known sick contacts.  Poor appetite but able to tolerate some food and liquids.  Has attempted use of NyQuil, Alka-Seltzer, oregano with black seed oil and ibuprofen.  Past Medical History:  Diagnosis Date   Abortion, missed 08/09/2015   Anemia    Bilateral swelling of feet    Constipation    Diabetes mellitus without complication (HCC)    Family history of premature CAD 12/01/2022   Fatty liver    Gallbladder polyp    Hepatic cyst    High cholesterol    History of chicken pox    Hypertension    Leg swelling 12/01/2022   Lower extremity edema 09/10/2021   Migraine    After menses cycle.    Pure hypercholesterolemia 09/10/2021   Umbilical hernia 2014    Patient Active Problem List   Diagnosis Date Noted   Type 2 diabetes mellitus with hyperglycemia (HCC) 06/22/2023   Vitamin D deficiency 06/22/2023   Leg swelling 12/01/2022   Family history of premature CAD 12/01/2022   Lower extremity edema 09/10/2021   Pure hypercholesterolemia 09/10/2021   Screen for colon cancer    Polyp of sigmoid colon    Iron deficiency anemia due to chronic blood loss 07/19/2019   Hepatic steatosis 01/17/2015   Encounter for preventive health examination 02/04/2014   Menorrhagia 05/04/2013   Recurrent ventral hernia 01/24/2013   Umbilical hernia    Obesity, diabetes, and hypertension syndrome (HCC) 10/31/2012   Obesity, morbid (HCC) 10/13/2012   Premenstrual symptom 10/13/2012   Essential hypertension, benign 10/13/2012    Past Surgical History:  Procedure Laterality  Date   CESAREAN SECTION     1993 & 2003   COLONOSCOPY WITH PROPOFOL N/A 04/26/2021   Procedure: COLONOSCOPY WITH PROPOFOL;  Surgeon: Pasty Spillers, MD;  Location: [facility];  Service: Endoscopy;  Laterality: N/A;   DILATION AND EVACUATION N/A 08/01/2015   Procedure: DILATATION AND EVACUATION With Tissue Sent For Chromosomal Analysis;  Surgeon: Maxie Better, MD;  Location: [facility];  Service: Gynecology;  Laterality: N/A;   HERNIA REPAIR  2007   OPERATIVE ULTRASOUND N/A 08/01/2015   Procedure: OPERATIVE ULTRASOUND;  Surgeon: Maxie Better, MD;  Location: [facility];  Service: Gynecology;  Laterality: N/A;    OB History     Gravida  3   Para  2   Term      Preterm      AB  1   Living  2      SAB  1   IAB      Ectopic      Multiple      Live Births           Obstetric Comments  1st Menstrual Cycle:  10 1st Pregnancy: 15          Home Medications    Prior to Admission medications   Medication Sig Start Date End Date Taking? Authorizing Provider  benzonatate (TESSALON) 100 MG capsule Take 1 capsule (100 mg total) by mouth every 8 (eight) hours. 09/01/23  Yes Tamel Abel R, NP  guaiFENesin-codeine 100-10 MG/5ML syrup Take 5 mLs by mouth every 6 (six) hours as needed for cough. 09/01/23  Yes Travonte Byard R, NP  amLODipine (NORVASC) 10 MG tablet TAKE 1 TABLET BY MOUTH EVERY DAY 12/08/22   Chilton Si, MD  Docusate Calcium (STOOL SOFTENER PO) Take by mouth. 2 capsules daily    [provider]  famotidine-calcium carbonate-magnesium hydroxide (PEPCID COMPLETE) 10-800-165 MG chewable tablet Chew 1 tablet by mouth 2 (two) times daily as needed. 07/27/23   Rayburn, Fanny Bien, PA-C  furosemide (LASIX) 20 MG tablet TAKE ONE TAB DAILY AS NEEDED FOR WEIGHT GAIN OR SWELLING 09/16/21   Chilton Si, MD  glucose blood test strip Use to check blood sugars twice daily 03/12/21   Sherlene Shams, MD  HOMEOPATHIC PRODUCTS PO Take by mouth.  Mega[employer] Blood Builder - vitamin C 15 mg, folic acid 400 mcg, Vitamin B12 30 mcg, Iron 26 mg    [provider]  rosuvastatin (CRESTOR) 5 MG tablet Take 1 tablet (5 mg total) by mouth daily. 12/12/22 06/22/23  Alver Sorrow, NP  spironolactone (ALDACTONE) 25 MG tablet TAKE 1 TABLET (25 MG TOTAL) BY MOUTH DAILY. 07/13/23 01/09/24  Sherlene Shams, MD  tirzepatide [facility]) 5 MG/0.5ML Pen Inject 5 mg into the skin once a week. 07/27/23   Rayburn, Fanny Bien, PA-C  Vitamin D, Ergocalciferol, (DRISDOL) 1.25 MG (50000 UNIT) CAPS capsule Take 1 capsule (50,000 Units total) by mouth every 7 (seven) days. 07/27/23   Rayburn, Fanny Bien, PA-C    Family History Family History  Problem Relation Age of Onset   Thyroid disease Mother    Hypertension Mother    Depression Mother    Sleep apnea Mother    Obesity Mother    Hyperlipidemia Father    Heart disease Father    Stroke Father    Diabetes Father    Hypertension Father    Hypertension Sister    Hyperlipidemia Maternal Grandmother    Cancer Maternal Grandmother        Breast   Breast cancer Maternal Grandmother    Kidney disease Paternal Grandmother    Diabetes Paternal Grandmother     Social History Social History   Tobacco Use   Smoking status: Never   Smokeless tobacco: Never  Vaping Use   Vaping status: Never Used  Substance Use Topics   Alcohol use: No   Drug use: No     Allergies   Lisinopril, Banana, Olmesartan, Valsartan, Maxzide [hydrochlorothiazide w-triamterene], and Vicodin [hydrocodone-acetaminophen]   Review of Systems Review of Systems   Physical Exam Triage Vital Signs ED Triage Vitals  Encounter Vitals Group     BP 09/01/23 1443 139/85     Systolic BP Percentile --      Diastolic BP Percentile --      Pulse Rate 09/01/23 1443 70     Resp 09/01/23 1443 20     Temp 09/01/23 1443 98 F (36.7 C)     Temp src --      SpO2 09/01/23 1443 98 %     Weight --      Height --      Head  Circumference --      Peak Flow --      Pain Score 09/01/23 1440 6     Pain Loc --      Pain Education --      Exclude from Growth Chart --    No data  found.  Updated Vital Signs BP 139/85   Pulse 70   Temp 98 F (36.7 C)   Resp 20   LMP 08/25/2023   SpO2 98%   Visual Acuity Right Eye Distance:   Left Eye Distance:   Bilateral Distance:    Right Eye Near:   Left Eye Near:    Bilateral Near:     Physical Exam Constitutional:      Appearance: Normal appearance.  HENT:     Head: Normocephalic.     Right Ear: Tympanic membrane, ear canal and external ear normal.     Left Ear: Tympanic membrane, ear canal and external ear normal.     Nose: Congestion present.     Mouth/Throat:     Pharynx: Posterior oropharyngeal erythema present. No oropharyngeal exudate.  Eyes:     Extraocular Movements: Extraocular movements intact.  Cardiovascular:     Rate and Rhythm: Normal rate and regular rhythm.     Pulses: Normal pulses.     Heart sounds: Normal heart sounds.  Pulmonary:     Effort: Pulmonary effort is normal.     Breath sounds: Normal breath sounds.  Neurological:     Mental Status: She is alert and oriented to person, place, and time. Mental status is at baseline.      UC Treatments / Results  Labs (all labs ordered are listed, but only abnormal results are displayed) Labs Reviewed  POC COVID19/FLU A&B COMBO - Abnormal; Notable for the following components:      Result Value   Influenza A Antigen, POC Positive (*)    All other components within normal limits    EKG   Radiology No results found.  Procedures Procedures (including critical care time)  Medications Ordered in UC Medications - No data to display  Initial Impression / Assessment and Plan / UC Course  I have reviewed the triage vital signs and the nursing notes.  Pertinent labs & imaging results that were available during my care of the patient were reviewed by me and considered in my  medical decision making (see chart for details).  Influenza A  Patient is in no signs of distress nor toxic appearing.  Vital signs are stable.  Low suspicion for pneumonia, pneumothorax or bronchitis and therefore will defer imaging.  Past window for Tamiflu as she has already been sick for 3 days, discussed this with patient.  Prescribed Tessalon guaifenesin codeine, PDMP reviewed low risk. May use additional over-the-counter medications as needed for supportive care.  May follow-up with urgent care as needed if symptoms persist or worsen.  Note given.   Final Clinical Impressions(s) / UC Diagnoses   Final diagnoses:  Influenza A     Discharge Instructions      They are most likely being caused by influenza A, your symptoms are consistent with the current presentation, influenza is a virus and will steadily improve with time  You may use Tessalon pill every 8 hours to help calm your coughing, may use cough syrup every 6 hours as needed for additional comfort, be mindful can make you feel sleepy    You can take Tylenol and/or Ibuprofen as needed for fever reduction and pain relief.   For cough: honey 1/2 to 1 teaspoon (you can dilute the honey in water or another fluid).  You can also use guaifenesin and dextromethorphan for cough. You can use a humidifier for chest congestion and cough.  If you don't have a humidifier, you can sit in  the bathroom with the hot shower running.      For sore throat: try warm salt water gargles, cepacol lozenges, throat spray, warm tea or water with lemon/honey, popsicles or ice, or OTC cold relief medicine for throat discomfort.   For congestion: take a daily anti-histamine like Zyrtec, Claritin, and a oral decongestant, such as pseudoephedrine.  You can also use Flonase 1-2 sprays in each nostril daily.   It is important to stay hydrated: drink plenty of fluids (water, gatorade/powerade/pedialyte, juices, or teas) to keep your throat moisturized and help  further relieve irritation/discomfort.      ED Prescriptions     Medication Sig Dispense Auth. Provider   benzonatate (TESSALON) 100 MG capsule Take 1 capsule (100 mg total) by mouth every 8 (eight) hours. 21 capsule Olisa Quesnel R, NP   guaiFENesin-codeine 100-10 MG/5ML syrup Take 5 mLs by mouth every 6 (six) hours as needed for cough. 120 mL Smantha Boakye, Elita Boone, NP      I have reviewed the PDMP during this encounter.   Valinda Hoar, NP 09/01/23 1538

## 2023-09-01 NOTE — ED Triage Notes (Signed)
Patient to Urgent Care with complaints of body aches/ fevers/ cough/ fatigue.  Reports symptoms started Saturday night.   Meds: Alker-seltzer plus/ Nyquil/ herbal medicine.

## 2023-09-01 NOTE — Discharge Instructions (Addendum)
They are most likely being caused by influenza A, your symptoms are consistent with the current presentation, influenza is a virus and will steadily improve with time  You may use Tessalon pill every 8 hours to help calm your coughing, may use cough syrup every 6 hours as needed for additional comfort, be mindful can make you feel sleepy    You can take Tylenol and/or Ibuprofen as needed for fever reduction and pain relief.   For cough: honey 1/2 to 1 teaspoon (you can dilute the honey in water or another fluid).  You can also use guaifenesin and dextromethorphan for cough. You can use a humidifier for chest congestion and cough.  If you don't have a humidifier, you can sit in the bathroom with the hot shower running.      For sore throat: try warm salt water gargles, cepacol lozenges, throat spray, warm tea or water with lemon/honey, popsicles or ice, or OTC cold relief medicine for throat discomfort.   For congestion: take a daily anti-histamine like Zyrtec, Claritin, and a oral decongestant, such as pseudoephedrine.  You can also use Flonase 1-2 sprays in each nostril daily.   It is important to stay hydrated: drink plenty of fluids (water, gatorade/powerade/pedialyte, juices, or teas) to keep your throat moisturized and help further relieve irritation/discomfort.

## 2023-09-03 ENCOUNTER — Encounter (INDEPENDENT_AMBULATORY_CARE_PROVIDER_SITE_OTHER): Payer: Self-pay | Admitting: Physician Assistant

## 2023-09-03 ENCOUNTER — Telehealth (INDEPENDENT_AMBULATORY_CARE_PROVIDER_SITE_OTHER): Payer: Self-pay

## 2023-09-03 ENCOUNTER — Telehealth (INDEPENDENT_AMBULATORY_CARE_PROVIDER_SITE_OTHER): Payer: Managed Care, Other (non HMO) | Admitting: Physician Assistant

## 2023-09-03 DIAGNOSIS — E1165 Type 2 diabetes mellitus with hyperglycemia: Secondary | ICD-10-CM | POA: Diagnosis not present

## 2023-09-03 DIAGNOSIS — Z6841 Body Mass Index (BMI) 40.0 and over, adult: Secondary | ICD-10-CM

## 2023-09-03 DIAGNOSIS — R1013 Epigastric pain: Secondary | ICD-10-CM | POA: Diagnosis not present

## 2023-09-03 DIAGNOSIS — J101 Influenza due to other identified influenza virus with other respiratory manifestations: Secondary | ICD-10-CM

## 2023-09-03 DIAGNOSIS — E559 Vitamin D deficiency, unspecified: Secondary | ICD-10-CM

## 2023-09-03 DIAGNOSIS — Z7985 Long-term (current) use of injectable non-insulin antidiabetic drugs: Secondary | ICD-10-CM

## 2023-09-03 MED ORDER — VITAMIN D (ERGOCALCIFEROL) 1.25 MG (50000 UNIT) PO CAPS: 50000.0000 [IU] | ORAL_CAPSULE | ORAL | 0 refills | Status: DC

## 2023-09-03 MED ORDER — TIRZEPATIDE 5 MG/0.5ML ~~LOC~~ SOAJ: 5.0000 mg | SUBCUTANEOUS | 0 refills | Status: DC

## 2023-09-03 NOTE — Progress Notes (Signed)
TeleHealth Visit:  This visit was completed with telemedicine (audio/video) technology. Nicole Burns has verbally consented to this TeleHealth visit. The patient is located at home, the provider is located at the [facility] office. The participants in this visit include the listed provider and patient. The visit was conducted today via MyChart video.  OBESITY Nicole Burns is here to discuss her progress with her obesity treatment plan along with follow-up of her obesity related diagnoses.   Today's visit was # 8 Starting weight: 243 lbs Starting date: 03/05/2023 Weight at last in office visit: 242 lbs on 08/12/23 Total weight loss: 1 lbs at last in office visit on 08/12/23. Today's reported weight (None): none reported  Nutrition Plan: the Category 3 plan - 50% adherence.  Current exercise:  Not exercising as sick with influenza A  Interim History:  Nicole Burns is a 48 year old female with obesity who presents for follow-up of her obesity treatment plan.  She was diagnosed with influenza A on September 01, 2023. She initially experienced high fevers and achiness, which have improved, but she continues to have low-grade fevers at night. Gastrointestinal symptoms, including diarrhea and nausea, have resolved. She has struggled with maintaining adequate hydration, feeling dry and recognizing the need to increase her water intake.  Due to her recent illness, she has not adhered strictly to her nutrition plan and eats what she can when she has an appetite, attempting to include protein. She has not exercised recently and has avoided weighing herself due to discouragement.  She has been taking Nicole Burns for weight management but skipped a dose the week her symptoms started. She plans to resume it next week. She continues to take vitamin D and uses Avagard for dyspepsia, which she finds helpful. She has not experienced side effects from Nicole Burns such as difficulty swallowing, Nicole changes, or mood  changes, though she notes mood fluctuations possibly related to perimenopause. She has been sleeping more than usual due to her illness.  Not eating all of the food on the plan., Protein intake is less than prescribed., Not meeting calorie goals., Not meeting protein goals., Water intake is inadequate., Denies polyphagia, and Denies excessive cravings.    Pharmacotherapy: Nicole Burns is on Nicole Burns 5.0 mg SQ weekly Denies mass in neck, dysphagia, dyspepsia, persistent hoarseness, abdominal pain, or N/V/Constipation or diarrhea. Has annual eye exam. Mood is stable.  Skipped Nicole Burns this week due to influenza A infection and feeling poorly.  Adverse side effects: None Hunger is moderately controlled.  Cravings are moderately controlled.  Assessment/Plan:   Obesity Follow-up for obesity management. She has been unable to follow her nutrition plan strictly due to recent influenza A. She has not taken Nicole Burns for one week due to illness. No significant side effects from Nicole Burns reported. Discussed the importance of hydration and resuming medication once feeling better. Advised that being off Nicole Burns for a week or two is usually fine, but if off for more than three or four weeks, dosage adjustment may be needed to avoid gastrointestinal upset. - Refill Nicole Burns - Advise to resume Nicole Burns next week - Encourage hydration (80-100 ounces of water per day)  Influenza A Diagnosed with influenza A on February 11th. Symptoms include low-grade fever at night, diarrhea, and nausea, which are improving. No high fevers currently. Breathing is stable. Discussed that low-grade fever at night may be due to not taking medication during sleep. - Monitor symptoms, if any worsening, seek care .  - Encourage hydration, symptom control.    Type 2 Diabetes Mellitus  Type 2 Diabetes Mellitus with hyperglycemia, without long-term current use of insulin HgbA1c is at goal. Last A1c was 6.1  Medication(s): Nicole Burns 5.0  mg SQ weekly Denies mass in neck, dysphagia, dyspepsia, persistent hoarseness, abdominal pain, or N/V/Constipation or diarrhea. Has annual eye exam. Mood is stable.    Lab Results  Component Value Date   HGBA1C 6.1 (H) 03/05/2023   HGBA1C 5.3 07/24/2022   HGBA1C 5.4 04/15/2022   Lab Results  Component Value Date   MICROALBUR 0.8 04/15/2022   LDLCALC 104 (H) 03/05/2023   CREATININE 0.99 03/05/2023   Lab Results  Component Value Date   GFR 85.63 07/24/2022   GFR 73.63 04/15/2022   GFR 75.82 11/15/2021    Plan: Continue and refill Nicole Burns 5.0 mg SQ weekly She is working  on nutrition plan to decrease simple carbohydrates, increase lean proteins and exercise to promote weight loss and improve glycemic control .  Vitamin D Deficiency Vitamin D is not at goal of 50.  Most recent vitamin D level was 36.5. She is on  prescription ergocalciferol 50,000 IU weekly. No N/V or muscle weakness with Ergocalciferol.  Lab Results  Component Value Date   VD25OH 36.5 03/05/2023    Plan: Continue and refill  prescription ergocalciferol 50,000 IU weekly Low vitamin D levels can be associated with adiposity and may result in leptin resistance and weight gain. Also associated with fatigue.  Currently on vitamin D supplementation without any adverse effects such as nausea, vomiting or muscle weakness.  Recheck vitamin D levels several times yearly to optimize supplementation/avoid over supplementation.    Dyspepsia She reports using iBgard instead of Pepcid Complete, which has been effective. No new dyspepsia symptoms reported. Discussed that Avagard likely contains probiotics, which may help with regularity. - Continue using iBgard if effective  Follow-up - Schedule follow-up visit on March 6th at 3 PM. Morbid Obesity: Current BMI 41.52  Pharmacotherapy Plan Continue and refill  Nicole Burns 5.0 mg SQ weekly  [place] is currently in the action stage of change. As such, her goal is to  continue with weight loss efforts.  She has agreed to the Category 3 plan.  Exercise goals: All adults should avoid inactivity. Some physical activity is better than none, and adults who participate in any amount of physical activity gain some health benefits.  Behavioral modification strategies: increasing lean protein intake, decreasing simple carbohydrates , no meal skipping, increase water intake, better snacking choices, increasing vegetables, increasing fiber rich foods, avoiding temptations, work on smaller portions, and mindful eating.  [place] has agreed to follow-up with our clinic in 3 weeks.  No orders of the defined types were placed in this encounter.   There are no discontinued medications.   No orders of the defined types were placed in this encounter.     Objective:   VITALS: Per patient if applicable, see vitals. GENERAL: Alert and in no acute distress. CARDIOPULMONARY: No increased WOB. Speaking in clear sentences.  PSYCH: Pleasant and cooperative. Speech normal rate and rhythm. Affect is appropriate. Insight and judgement are appropriate. Attention is focused, linear, and appropriate.  NEURO: Oriented as arrived to appointment on time with no prompting.   Attestation Statements:   Reviewed by clinician on day of visit: allergies, medications, problem list, medical history, surgical history, family history, social history, and previous encounter notes.   Time spent on visit including the items listed below was 24 minutes.  -preparing to see the patient (e.g., review of tests, history, previous notes) -obtaining  and/or reviewing separately obtained history -counseling and educating the patient/family/caregiver -documenting clinical information in the electronic or other health record -ordering medications, tests, or procedures -independently interpreting results and communicating results to the patient/ family/caregiver -referring and communicating with other  health care professionals  -care coordination  Lynann Demetrius,PA-C

## 2023-09-03 NOTE — Telephone Encounter (Signed)
Called the patient a second time to go over screening questions for her appointment today, and again no answer. I will call one more time before her appointment today.

## 2023-09-03 NOTE — Telephone Encounter (Signed)
Called the patient a third and final time. Left messages and called 3 times. Never spoke to her to go over the screening questions.

## 2023-09-03 NOTE — Telephone Encounter (Signed)
Left a message for her to call me back so I can go over her screening questions before her appointment today at 3:00

## 2023-09-20 ENCOUNTER — Other Ambulatory Visit: Payer: Self-pay | Admitting: Cardiovascular Disease

## 2023-09-24 ENCOUNTER — Ambulatory Visit (INDEPENDENT_AMBULATORY_CARE_PROVIDER_SITE_OTHER): Payer: Managed Care, Other (non HMO) | Admitting: Physician Assistant

## 2023-10-05 ENCOUNTER — Ambulatory Visit (INDEPENDENT_AMBULATORY_CARE_PROVIDER_SITE_OTHER): Admitting: Physician Assistant

## 2023-10-05 ENCOUNTER — Encounter (INDEPENDENT_AMBULATORY_CARE_PROVIDER_SITE_OTHER): Payer: Self-pay | Admitting: Physician Assistant

## 2023-10-05 VITALS — BP 134/79 | HR 78 | Temp 97.9°F | Ht 64.0 in

## 2023-10-05 DIAGNOSIS — N951 Menopausal and female climacteric states: Secondary | ICD-10-CM

## 2023-10-05 DIAGNOSIS — Z7985 Long-term (current) use of injectable non-insulin antidiabetic drugs: Secondary | ICD-10-CM

## 2023-10-05 DIAGNOSIS — I152 Hypertension secondary to endocrine disorders: Secondary | ICD-10-CM | POA: Diagnosis not present

## 2023-10-05 DIAGNOSIS — E1165 Type 2 diabetes mellitus with hyperglycemia: Secondary | ICD-10-CM | POA: Diagnosis not present

## 2023-10-05 DIAGNOSIS — E1169 Type 2 diabetes mellitus with other specified complication: Secondary | ICD-10-CM

## 2023-10-05 DIAGNOSIS — E1159 Type 2 diabetes mellitus with other circulatory complications: Secondary | ICD-10-CM | POA: Diagnosis not present

## 2023-10-05 DIAGNOSIS — R5383 Other fatigue: Secondary | ICD-10-CM

## 2023-10-05 DIAGNOSIS — E559 Vitamin D deficiency, unspecified: Secondary | ICD-10-CM

## 2023-10-05 DIAGNOSIS — Z6841 Body Mass Index (BMI) 40.0 and over, adult: Secondary | ICD-10-CM

## 2023-10-05 DIAGNOSIS — E785 Hyperlipidemia, unspecified: Secondary | ICD-10-CM

## 2023-10-05 DIAGNOSIS — R1013 Epigastric pain: Secondary | ICD-10-CM

## 2023-10-05 DIAGNOSIS — D500 Iron deficiency anemia secondary to blood loss (chronic): Secondary | ICD-10-CM

## 2023-10-05 MED ORDER — TIRZEPATIDE 5 MG/0.5ML ~~LOC~~ SOAJ: 5.0000 mg | SUBCUTANEOUS | 0 refills | Status: DC

## 2023-10-05 MED ORDER — VITAMIN D (ERGOCALCIFEROL) 1.25 MG (50000 UNIT) PO CAPS: 50000.0000 [IU] | ORAL_CAPSULE | ORAL | 0 refills | Status: DC

## 2023-10-05 NOTE — Progress Notes (Signed)
 SUBJECTIVE: Discussed the use of AI scribe software for clinical note transcription with the patient, who gave verbal consent to proceed.  Chief Complaint: Obesity  Interim History: She is down 3 lbs since last visit.   Nicole Burns is here to discuss her progress with her obesity treatment plan. She is on the Category 3 Plan and states she is following her eating plan approximately 50 % of the time. She states she is not exercising 0 minutes 0 times per week.  Nicole Burns is a 48 year old female who presents for follow-up of her obesity treatment plan.  She has lost three pounds since her last visit and is currently on Mounjaro 5 mg weekly for type 2 diabetes, Crestor 5 mg once daily for hyperlipidemia, and ergocalciferol 50,000 units weekly for vitamin D deficiency.  She focuses on consuming protein when she does eat, despite challenges with her appetite.  She experiences significant gastrointestinal side effects from [facility], including excessive gas and a reduced appetite, often only managing to eat one meal a day. She must be cautious about her food intake, particularly avoiding greasy foods, which exacerbate her symptoms.  She has a history of anemia for which she was receiving iron infusions, but has not continued due to previous job constraints. She plans to resume treatment now that her job situation has improved.  She reports sleep disturbances, often waking up at 2 or 3 AM and being unable to return to sleep. No ruminating over thoughts during these episodes but describes a general lack of restful sleep.  She is experiencing symptoms suggestive of perimenopause, including hot flashes, mood swings, inconsistent sleep patterns, and a decreased libido. She has not been officially diagnosed with perimenopause but suspects it due to her symptoms. We discussed following up with her GYN .   She has a family history of breast cancer, as her grandmother had the condition. She recently had  a mammogram, which was performed earlier today.  Fasting labs were obtained today The patient was informed we would discuss the lab results at the next visit unless there is a critical issue that needs to be addressed sooner. The patient agreed to keep the next visit at the agreed upon time to discuss these results.   OBJECTIVE: Visit Diagnoses: Problem List Items Addressed This Visit     Type 2 diabetes mellitus with hyperglycemia (HCC) - Primary   Relevant Medications   tirzepatide (MOUNJARO) 5 MG/0.5ML Pen   Other Relevant Orders   CMP14+EGFR   Hemoglobin A1c   Insulin, random   Vitamin D deficiency   Relevant Medications   Vitamin D, Ergocalciferol, (DRISDOL) 1.25 MG (50000 UNIT) CAPS capsule   Other Relevant Orders   VITAMIN D 25 Hydroxy (Vit-D Deficiency, Fractures)   Iron deficiency anemia due to chronic blood loss (Chronic)   Relevant Orders   Folate   Iron and TIBC   Ferritin   Other Visit Diagnoses       Dyspepsia         Hypertension associated with diabetes (HCC)       Relevant Medications   tirzepatide (MOUNJARO) 5 MG/0.5ML Pen     Hyperlipidemia associated with type 2 diabetes mellitus (HCC)       Relevant Medications   tirzepatide (MOUNJARO) 5 MG/0.5ML Pen   Other Relevant Orders   Lipid Panel With LDL/HDL Ratio     Other fatigue       Relevant Orders   Vitamin B12   CBC with Differential/Platelet  TSH     Perimenopausal symptoms         Obesity with starting BMI of 41.8       Relevant Medications   tirzepatide (MOUNJARO) 5 MG/0.5ML Pen     BMI 40.0-44.9, adult (HCC) Current BMI 41.1       Relevant Medications   tirzepatide (MOUNJARO) 5 MG/0.5ML Pen     Obesity She is being managed for obesity and has lost three pounds since the last visit. Mounjaro 5 mg weekly is contributing to weight loss but causing gastrointestinal side effects such as gas and reduced appetite. Dyspepsia may be exacerbated by the recent increase in [facility] dosage. Greggory Keen is  considered optimal for her type 2 diabetes and weight loss, but alternatives can be considered if side effects persist. - Encourage protein intake - Advise against skipping meals - Recommend Pepcid AC for dyspepsia - Monitor weight and symptoms for another month  Type 2 Diabetes Mellitus Type 2 diabetes is managed with Mounjaro 5 mg weekly. Last A1c was 6.1, within the target range. Greggory Keen is believed to optimize diabetes management and weight loss. Discussed the importance of maintaining A1c in the goal range and the potential need for alternative treatments if side effects are intolerable. Lab Results  Component Value Date   HGBA1C 6.1 (H) 03/05/2023   HGBA1C 5.3 07/24/2022   HGBA1C 5.4 04/15/2022   Lab Results  Component Value Date   MICROALBUR 0.8 04/15/2022   LDLCALC 104 (H) 03/05/2023   CREATININE 0.99 03/05/2023   She is working  on nutrition plan to decrease simple carbohydrates, increase lean proteins and exercise to promote weight loss and improve glycemic control . - Continue/refill Mounjaro 5 mg weekly - Order fasting labs including insulin and A1c  Dyspepsia Reports dyspepsia, potentially related to [facility]. Has tried IBGuard with some relief and is considering Pepcid for additional management. - Recommend Pepcid AC for dyspepsia - Monitor symptoms and adjust treatment as needed  Vitamin D Deficiency She is on ergocalciferol 50,000 units weekly for vitamin D deficiency without adverse effects. - Continue/refill ergocalciferol 50,000 units weekly Low vitamin D levels can be associated with adiposity and may result in leptin resistance and weight gain. Also associated with fatigue.  Currently on vitamin D supplementation without any adverse effects such as nausea, vomiting or muscle weakness.  Recheck vitamin D level today.   Anemia  Anemia management includes previous iron infusions. Plans to resume treatment now that her job situation has improved. - Order anemia  panel along with CBC - Encourage resumption of iron infusions as needed  Perimenopause Experiencing symptoms suggestive of perimenopause, including hot flashes, mood swings, and sleep disturbances. Not officially diagnosed but advised to discuss these symptoms with her GYN. Discussed potential benefits of estrogen replacement therapy and lifestyle modifications such as phytoestrogens and omega-3 intake. Emphasized the impact of poor sleep on overall health and the importance of addressing these symptoms. - Discuss perimenopausal symptoms with GYN - Consider estrogen replacement therapy - Encourage intake of phytoestrogens and omega-3 rich foods  Hyperlipidemia On Crestor 5 mg daily for hyperlipidemia. Noted changes in cholesterol levels, potentially related to hormonal changes associated with perimenopause. Last lipids Lab Results  Component Value Date   CHOL 167 03/05/2023   HDL 45 03/05/2023   LDLCALC 104 (H) 03/05/2023   LDLDIRECT 102.0 07/24/2022   TRIG 100 03/05/2023   CHOLHDL 3.5 12/01/2022   Continue to work on nutrition plan -decreasing simple carbohydrates, increasing lean proteins, decreasing saturated fats and cholesterol ,  avoiding trans fats and exercise as able to promote weight loss, improve lipids and decrease cardiovascular risks. - Continue Crestor 5 mg daily - Discuss cholesterol management with primary care or GYN  Fatigue Endorses fatigue, poor sleep.  Plan: Recheck B 12, CBC, TSH and Vitamin D today.   Vitals Temp: 97.9 F (36.6 C) BP: 134/79 Pulse Rate: 78 SpO2: 100 %   Anthropometric Measurements Height: 5\' 4"  (1.626 m) Weight at Last Visit: 242 lb Weight Lost Since Last Visit: 3 lb Weight Gained Since Last Visit: 0 Starting Weight: 243 lb Total Weight Loss (lbs): 4 lb (1.814 kg)   Body Composition  Body Fat %: 46.6 % Fat Mass (lbs): 111.4 lbs Muscle Mass (lbs): 121.4 lbs Total Body Water (lbs): 92.2 lbs Visceral Fat Rating : 14   Other  Clinical Data Fasting: yes Labs: no Today's Visit #: 8 Starting Date: 03/05/23     ASSESSMENT AND PLAN:  Diet: Ndia is currently in the action stage of change. As such, her goal is to continue with weight loss efforts and has agreed to the Category 3 Plan.   Exercise:  For substantial health benefits, adults should do at least 150 minutes (2 hours and 30 minutes) a week of moderate-intensity, or 75 minutes (1 hour and 15 minutes) a week of vigorous-intensity aerobic physical activity, or an equivalent combination of moderate- and vigorous-intensity aerobic activity. Aerobic activity should be performed in episodes of at least 10 minutes, and preferably, it should be spread throughout the week. and Adults should also include muscle-strengthening activities that involve all major muscle groups on 2 or more days a week.  Behavior Modification:  We discussed the following Behavioral Modification Strategies today: increasing lean protein intake, decreasing simple carbohydrates, increasing vegetables, increase H2O intake, increase high fiber foods, no skipping meals, avoiding temptations, and planning for success. We discussed various medication options to help Ashleah with her weight loss efforts and we both agreed to continue Mounjaro 5 mg weekly for Type 2 diabetes and continue to work on nutritional and behavioral strategies to promote weight loss.  .  Return in about 4 weeks (around 11/02/2023).Marland Kitchen She was informed of the importance of frequent follow up visits to maximize her success with intensive lifestyle modifications for her multiple health conditions.  Attestation Statements:   Reviewed by clinician on day of visit: allergies, medications, problem list, medical history, surgical history, family history, social history, and previous encounter notes.   Time spent on visit including pre-visit chart review and post-visit care and charting was 35 minutes  Chayne Baumgart,PA-C

## 2023-10-06 ENCOUNTER — Other Ambulatory Visit (INDEPENDENT_AMBULATORY_CARE_PROVIDER_SITE_OTHER): Payer: Self-pay | Admitting: Physician Assistant

## 2023-10-06 DIAGNOSIS — E559 Vitamin D deficiency, unspecified: Secondary | ICD-10-CM

## 2023-10-07 ENCOUNTER — Other Ambulatory Visit (INDEPENDENT_AMBULATORY_CARE_PROVIDER_SITE_OTHER): Payer: Self-pay | Admitting: Physician Assistant

## 2023-10-07 DIAGNOSIS — D500 Iron deficiency anemia secondary to blood loss (chronic): Secondary | ICD-10-CM

## 2023-10-07 LAB — CMP14+EGFR
ALT: 89 IU/L — ABNORMAL HIGH (ref 0–32)
AST: 24 IU/L (ref 0–40)
Albumin: 4.3 g/dL (ref 3.9–4.9)
Alkaline Phosphatase: 104 IU/L (ref 44–121)
BUN/Creatinine Ratio: 11 (ref 9–23)
BUN: 10 mg/dL (ref 6–24)
Bilirubin Total: 0.4 mg/dL (ref 0.0–1.2)
CO2: 22 mmol/L (ref 20–29)
Calcium: 10 mg/dL (ref 8.7–10.2)
Chloride: 104 mmol/L (ref 96–106)
Creatinine, Ser: 0.95 mg/dL (ref 0.57–1.00)
Globulin, Total: 3.6 g/dL (ref 1.5–4.5)
Glucose: 104 mg/dL — ABNORMAL HIGH (ref 70–99)
Potassium: 4.4 mmol/L (ref 3.5–5.2)
Sodium: 139 mmol/L (ref 134–144)
Total Protein: 7.9 g/dL (ref 6.0–8.5)
eGFR: 73 mL/min/{1.73_m2} (ref >59)

## 2023-10-07 LAB — CBC WITH DIFFERENTIAL/PLATELET
Basophils Absolute: 0.1 10*3/uL (ref 0.0–0.2)
Basos: 1 %
EOS (ABSOLUTE): 0.2 10*3/uL (ref 0.0–0.4)
Eos: 2 %
Hematocrit: 32 % — ABNORMAL LOW (ref 34.0–46.6)
Hemoglobin: 8.9 g/dL — ABNORMAL LOW (ref 11.1–15.9)
Immature Grans (Abs): 0 10*3/uL (ref 0.0–0.1)
Immature Granulocytes: 0 %
Lymphocytes Absolute: 2.9 10*3/uL (ref 0.7–3.1)
Lymphs: 33 %
MCH: 20.5 pg — ABNORMAL LOW (ref 26.6–33.0)
MCHC: 27.8 g/dL — ABNORMAL LOW (ref 31.5–35.7)
MCV: 74 fL — ABNORMAL LOW (ref 79–97)
Monocytes Absolute: 1 10*3/uL — ABNORMAL HIGH (ref 0.1–0.9)
Monocytes: 11 %
Neutrophils Absolute: 4.7 10*3/uL (ref 1.4–7.0)
Neutrophils: 53 %
Platelets: 456 10*3/uL — ABNORMAL HIGH (ref 150–450)
RBC: 4.35 x10E6/uL (ref 3.77–5.28)
RDW: 16.8 % — ABNORMAL HIGH (ref 11.7–15.4)
WBC: 8.8 10*3/uL (ref 3.4–10.8)

## 2023-10-07 LAB — IRON AND TIBC
Iron Saturation: 3 % — CL (ref 15–55)
Iron: 13 ug/dL — ABNORMAL LOW (ref 27–159)
Total Iron Binding Capacity: 440 ug/dL (ref 250–450)
UIBC: 427 ug/dL — ABNORMAL HIGH (ref 131–425)

## 2023-10-07 LAB — LIPID PANEL WITH LDL/HDL RATIO
Cholesterol, Total: 153 mg/dL (ref 100–199)
HDL: 45 mg/dL (ref >39)
LDL Chol Calc (NIH): 97 mg/dL (ref 0–99)
LDL/HDL Ratio: 2.2 ratio (ref 0.0–3.2)
Triglycerides: 54 mg/dL (ref 0–149)
VLDL Cholesterol Cal: 11 mg/dL (ref 5–40)

## 2023-10-07 LAB — INSULIN, RANDOM: INSULIN: 31.8 u[IU]/mL — ABNORMAL HIGH (ref 2.6–24.9)

## 2023-10-07 LAB — VITAMIN D 25 HYDROXY (VIT D DEFICIENCY, FRACTURES): Vit D, 25-Hydroxy: 45.4 ng/mL (ref 30.0–100.0)

## 2023-10-07 LAB — VITAMIN B12: Vitamin B-12: 947 pg/mL (ref 232–1245)

## 2023-10-07 LAB — TSH: TSH: 1.31 u[IU]/mL (ref 0.450–4.500)

## 2023-10-07 LAB — HEMOGLOBIN A1C
Est. average glucose Bld gHb Est-mCnc: 146 mg/dL
Hgb A1c MFr Bld: 6.7 % — ABNORMAL HIGH (ref 4.8–5.6)

## 2023-10-07 LAB — FERRITIN: Ferritin: 10 ng/mL — ABNORMAL LOW (ref 15–150)

## 2023-10-07 LAB — FOLATE: Folate: 10.6 ng/mL (ref >3.0)

## 2023-10-08 ENCOUNTER — Encounter (INDEPENDENT_AMBULATORY_CARE_PROVIDER_SITE_OTHER): Payer: Self-pay | Admitting: Physician Assistant

## 2023-10-08 ENCOUNTER — Encounter (INDEPENDENT_AMBULATORY_CARE_PROVIDER_SITE_OTHER): Payer: Self-pay

## 2023-10-08 ENCOUNTER — Telehealth (INDEPENDENT_AMBULATORY_CARE_PROVIDER_SITE_OTHER): Payer: Self-pay | Admitting: *Deleted

## 2023-10-08 NOTE — Telephone Encounter (Signed)
 Called patient and left a voice message  to call back to get results and also  sent a message thru my chart.

## 2023-10-12 ENCOUNTER — Ambulatory Visit (INDEPENDENT_AMBULATORY_CARE_PROVIDER_SITE_OTHER): Admitting: Internal Medicine

## 2023-10-12 ENCOUNTER — Encounter: Payer: Self-pay | Admitting: Internal Medicine

## 2023-10-12 ENCOUNTER — Encounter: Payer: Self-pay | Admitting: Oncology

## 2023-10-12 VITALS — BP 128/82 | HR 73 | Ht 64.0 in | Wt 242.6 lb

## 2023-10-12 DIAGNOSIS — E7800 Pure hypercholesterolemia, unspecified: Secondary | ICD-10-CM

## 2023-10-12 DIAGNOSIS — E669 Obesity, unspecified: Secondary | ICD-10-CM | POA: Diagnosis not present

## 2023-10-12 DIAGNOSIS — E1159 Type 2 diabetes mellitus with other circulatory complications: Secondary | ICD-10-CM

## 2023-10-12 DIAGNOSIS — I10 Essential (primary) hypertension: Secondary | ICD-10-CM

## 2023-10-12 DIAGNOSIS — I152 Hypertension secondary to endocrine disorders: Secondary | ICD-10-CM | POA: Diagnosis not present

## 2023-10-12 DIAGNOSIS — N943 Premenstrual tension syndrome: Secondary | ICD-10-CM

## 2023-10-12 DIAGNOSIS — E1165 Type 2 diabetes mellitus with hyperglycemia: Secondary | ICD-10-CM

## 2023-10-12 DIAGNOSIS — D500 Iron deficiency anemia secondary to blood loss (chronic): Secondary | ICD-10-CM

## 2023-10-12 DIAGNOSIS — E1169 Type 2 diabetes mellitus with other specified complication: Secondary | ICD-10-CM | POA: Diagnosis not present

## 2023-10-12 DIAGNOSIS — R600 Localized edema: Secondary | ICD-10-CM

## 2023-10-12 DIAGNOSIS — Z7985 Long-term (current) use of injectable non-insulin antidiabetic drugs: Secondary | ICD-10-CM

## 2023-10-12 MED ORDER — TRAZODONE HCL 50 MG PO TABS: 25.0000 mg | ORAL_TABLET | Freq: Every evening | ORAL | 1 refills | Status: DC | PRN

## 2023-10-12 NOTE — Assessment & Plan Note (Addendum)
 Reviewed last OV with hematology over one year ago :  #Iron deficiency anemia due to chronic blood loss. Labs reviewed and discussed with patient. Hemoglobin and iron panel have both improved, although still iron deficient.  Recommend additional IV Venofer weekly x4. Patient denies any chance of pregnancy and declines pregnancy testing.

## 2023-10-12 NOTE — Assessment & Plan Note (Signed)
 Glycemic control has slipped slgihtly despite using  Mounjaro., which may be due to reduced dosing due to GI side effects so is taking the medication every 2 weeks. She needs a refill today. She is overdue to proteinuria assessment;  collected today  Lab Results  Component Value Date   HGBA1C 6.7 (H) 10/05/2023   Lab Results  Component Value Date   MICROALBUR 0.8 04/15/2022   MICROALBUR 1.0 05/20/2021

## 2023-10-12 NOTE — Patient Instructions (Addendum)
 I encouarge you to start walking for exercise.  Work your way up to 30 minutes daily.  This will improve your appetite!   I have prescribed trazodone to help you rest at night. . It is an old antidepressant that helps primarily with [place]  Start with 1/2 tablet at bedtime  You may increase the dose to a full tablet after a week if you are still having trouble going back to sleep    We should repeat your A1c   and your cholesterol in 3 months since it has gone up slightly .

## 2023-10-12 NOTE — Assessment & Plan Note (Addendum)
 Weight loss has been slow  despite using Mounjaro for the past year (16 lbs  in the last 12 months)  and often eating only one meal daily  encouarged to start exercising and follow up with [facility]

## 2023-10-12 NOTE — Progress Notes (Unsigned)
 Subjective:  Patient ID: Nicole Burns, female    DOB: [birth year]  Age: 48 y.o. MRN: [id number]  CC: The primary encounter diagnosis was Obesity, diabetes, and hypertension syndrome (HCC). Diagnoses of Pure hypercholesterolemia, Obesity, morbid (HCC), Type 2 diabetes mellitus with hyperglycemia, without long-term current use of insulin (HCC), Premenstrual symptom, and Iron deficiency anemia due to chronic blood loss were also pertinent to this visit.   HPI Nicole Burns presents for  Chief Complaint  Patient presents with   Medical Management of Chronic Issues    Discuss perimenopause.     1) IDA: worsening .  Having monthly menses which last 7 days. Her GYN has suggested an ablation but she has deferred.    She has not had iron infusions in over a year due to work issues,  but has started taking oral iron "black girl  vitamins" tolerating without nausea once daily with orange juice   2) T2D A1c increased to 6.7 he ha  3) morbid obesity  4) permenopause:  mood swings,  loss of libido,  not sleeping at night: reviewed regimen: in be dby 10 pm . Wakes up between 2:30 and 3 wide awake   Outpatient Medications Prior to Visit  Medication Sig Dispense Refill   amLODipine (NORVASC) 10 MG tablet TAKE 1 TABLET BY MOUTH EVERY DAY 30 tablet 0   benzonatate (TESSALON) 100 MG capsule Take 1 capsule (100 mg total) by mouth every 8 (eight) hours. 21 capsule 0   Docusate Calcium (STOOL SOFTENER PO) Take by mouth. 2 capsules daily     famotidine-calcium carbonate-magnesium hydroxide (PEPCID COMPLETE) 10-800-165 MG chewable tablet Chew 1 tablet by mouth 2 (two) times daily as needed. 60 tablet 0   furosemide (LASIX) 20 MG tablet TAKE ONE TAB DAILY AS NEEDED FOR WEIGHT GAIN OR SWELLING 30 tablet 6   glucose blood test strip Use to check blood sugars twice daily 200 each 3   guaiFENesin-codeine 100-10 MG/5ML syrup Take 5 mLs by mouth every 6 (six) hours as needed for cough. 120 mL 0   HOMEOPATHIC  PRODUCTS PO Take by mouth. Mega[employer] Blood Builder - vitamin C 15 mg, folic acid 400 mcg, Vitamin B12 30 mcg, Iron 26 mg     rosuvastatin (CRESTOR) 5 MG tablet Take 1 tablet (5 mg total) by mouth daily. 90 tablet 3   spironolactone (ALDACTONE) 25 MG tablet TAKE 1 TABLET (25 MG TOTAL) BY MOUTH DAILY. 90 tablet 1   tirzepatide (MOUNJARO) 5 MG/0.5ML Pen Inject 5 mg into the skin once a week. 2 mL 0   Vitamin D, Ergocalciferol, (DRISDOL) 1.25 MG (50000 UNIT) CAPS capsule Take 1 capsule (50,000 Units total) by mouth every 7 (seven) days. 4 capsule 0   No facility-administered medications prior to visit.    Review of Systems;  Patient denies headache, fevers, malaise, unintentional weight loss, skin rash, eye pain, sinus congestion and sinus pain, sore throat, dysphagia,  hemoptysis , cough, dyspnea, wheezing, chest pain, palpitations, orthopnea, edema, abdominal pain, nausea, melena, diarrhea, constipation, flank pain, dysuria, hematuria, urinary  Frequency, nocturia, numbness, tingling, seizures,  Focal weakness, Loss of consciousness,  Tremor, insomnia, depression, anxiety, and suicidal ideation.      Objective:  BP 128/82   Pulse 73   Ht 5\' 4"  (1.626 m)   Wt 242 lb 9.6 oz (110 kg)   SpO2 98%   BMI 41.64 kg/m   BP Readings from Last 3 Encounters:  10/12/23 128/82  10/05/23 134/79  09/01/23 139/85  Wt Readings from Last 3 Encounters:  10/12/23 242 lb 9.6 oz (110 kg)  08/12/23 242 lb (109.8 kg)  07/27/23 243 lb (110.2 kg)    Physical Exam  Lab Results  Component Value Date   HGBA1C 6.7 (H) 10/05/2023   HGBA1C 6.1 (H) 03/05/2023   HGBA1C 5.3 07/24/2022    Lab Results  Component Value Date   CREATININE 0.95 10/05/2023   CREATININE 0.99 03/05/2023   CREATININE 0.99 12/01/2022    Lab Results  Component Value Date   WBC 8.8 10/05/2023   HGB 8.9 (L) 10/05/2023   HCT 32.0 (L) 10/05/2023   PLT 456 (H) 10/05/2023   GLUCOSE 104 (H) 10/05/2023   CHOL 153 10/05/2023   TRIG  54 10/05/2023   HDL 45 10/05/2023   LDLDIRECT 102.0 07/24/2022   LDLCALC 97 10/05/2023   ALT 89 (H) 10/05/2023   AST 24 10/05/2023   NA 139 10/05/2023   K 4.4 10/05/2023   CL 104 10/05/2023   CREATININE 0.95 10/05/2023   BUN 10 10/05/2023   CO2 22 10/05/2023   TSH 1.310 10/05/2023   HGBA1C 6.7 (H) 10/05/2023   MICROALBUR 0.8 04/15/2022    No results found.  Assessment & Plan:  .Obesity, diabetes, and hypertension syndrome (HCC)  Pure hypercholesterolemia  Obesity, morbid (HCC) Assessment & Plan: Weight loss has been slow despite using Mounjaro and often eating only one meal daily  encouarged to start exercising and follow up with [facility]    Type 2 diabetes mellitus with hyperglycemia, without long-term current use of insulin (HCC) Assessment & Plan: Glycemic control has slipped slgihtly despite using  Mounjaro., which may be due to reduced dosing due to GI side effects so is taking the medication every 2 weeks. She needs a refill today. Last A1c was doing in August and was 6.1.  Refill sent in.  Needs labs at next appointment.  Lab Results  Component Value Date   HGBA1C 6.7 (H) 10/05/2023      Premenstrual symptom Assessment & Plan: Perimenopause symptoms discussed.    Iron deficiency anemia due to chronic blood loss Assessment & Plan: Reviewed last OV with hematology over one year ago :  #Iron deficiency anemia due to chronic blood loss. Labs reviewed and discussed with patient. Hemoglobin and iron panel have both improved, although still iron deficient.  Recommend additional IV Venofer weekly x4. Patient denies any chance of pregnancy and declines pregnancy testing.       I spent 34 minutes on the day of this face to face encounter reviewing patient's  most recent visit with cardiology,  nephrology,  and neurology,  prior relevant surgical and non surgical procedures, recent  labs and imaging studies, counseling on weight management,   reviewing the assessment and plan with patient, and post visit ordering and reviewing of  diagnostics and therapeutics with patient  .   Follow-up: No follow-ups on file.   Sherlene Shams, MD

## 2023-10-12 NOTE — Assessment & Plan Note (Signed)
 Perimenopause symptoms discussed.

## 2023-10-13 ENCOUNTER — Encounter: Payer: Self-pay | Admitting: Internal Medicine

## 2023-10-13 LAB — MICROALBUMIN / CREATININE URINE RATIO
Creatinine,U: 97 mg/dL
Microalb Creat Ratio: UNDETERMINED mg/g (ref 0.0–30.0)
Microalb, Ur: <0.7 mg/dL

## 2023-10-13 NOTE — Assessment & Plan Note (Signed)
 She has been uanable to lose any significant weight despite reporting minimal intake of food and dietary guidance from  [facility]  and use of Mounjaro.  Encourage to exercise daily to improve appetite

## 2023-10-13 NOTE — Assessment & Plan Note (Signed)
 Stable on spironolactone . Prn use of furosemide.    Renal function and electrolytes are stable.

## 2023-10-13 NOTE — Assessment & Plan Note (Signed)
 Improved with regimen of valsartan 80 mg,  amlodipine and spironolactone.  Lytes are normal   Lab Results  Component Value Date   NA 139 10/05/2023   K 4.4 10/05/2023   CL 104 10/05/2023   CO2 22 10/05/2023   Lab Results  Component Value Date   CREATININE 0.95 10/05/2023   Lab Results  Component Value Date   MICROALBUR 0.8 04/15/2022   MICROALBUR 1.0 05/20/2021

## 2023-10-14 ENCOUNTER — Other Ambulatory Visit (INDEPENDENT_AMBULATORY_CARE_PROVIDER_SITE_OTHER): Payer: Self-pay | Admitting: Physician Assistant

## 2023-10-14 DIAGNOSIS — E559 Vitamin D deficiency, unspecified: Secondary | ICD-10-CM

## 2023-10-14 MED ORDER — AMLODIPINE BESYLATE 10 MG PO TABS: 10.0000 mg | ORAL_TABLET | Freq: Every day | ORAL | 1 refills | Status: DC

## 2023-10-26 ENCOUNTER — Ambulatory Visit: Admitting: Internal Medicine

## 2023-11-03 NOTE — Progress Notes (Unsigned)
 SUBJECTIVE: Discussed the use of AI scribe software for clinical note transcription with the patient, who gave verbal consent to proceed.  Chief Complaint: Obesity  Interim History: She is up 5 lbs since last visit.   Nicole Burns is here to discuss her progress with her obesity treatment plan. She is on the Category 3 Plan and states she is following her eating plan approximately 50 % of the time. She states she is not exercising.  Nicole Burns is a 48 year old female who presents for follow-up of her obesity treatment plan.  She experiences nausea, bloating, and sulfur burps after taking her medication, particularly after consuming a peanut butter and jelly sandwich. The onset of sulfur burps marks a turning point, leading to significant discomfort. She also feels tired today due to disrupted sleep.  She is on a category three nutrition plan but adheres to it only about fifty percent of the time and has not been exercising. While on Ozempic, she experienced significant side effects only when consuming inappropriate foods. With her current medication, Mounjaro, side effects diminish as the week progresses. She takes her medication at night to minimize side effects but still experiences fatigue the following day.  She has a history of anemia with a current hemoglobin level of 8.9, which she considers acceptable for her, though it is low. She has received iron infusions in the past and is coordinating further treatment. A colonoscopy at age 74 revealed a sessile polyp in the sigmoid colon but no other significant findings. She has not had a recent upper GI evaluation.  She does not regularly check her blood sugars and has not been on metformin. She recalls a past discussion about metformin when her A1c was higher but was hesitant due to negative perceptions. She has not been on Rybelsus or Jardiance but is aware of these medications from commercials.  OBJECTIVE: Visit Diagnoses: Problem List  Items Addressed This Visit     Type 2 diabetes mellitus with hyperglycemia (HCC) - Primary   Vitamin D deficiency   Relevant Medications   Vitamin D, Ergocalciferol, (DRISDOL) 1.25 MG (50000 UNIT) CAPS capsule   Iron deficiency anemia due to chronic blood loss (Chronic)   Other Visit Diagnoses       Obesity with starting BMI of 41.8         BMI 40.0-44.9, adult (HCC) Current BMI 41.9          Obesity She adheres to a category three nutrition plan approximately 50% of the time and is not exercising. She experiences nausea, bloating, and sulfur burps from [facility], affecting adherence.  We discussed that GI upset is a common reason for intolerance to medications like Ozempic and Mounjaro.  Greggory Keen shows more significant weight loss than Ozempic, but she is sensitive to dosing. Side effects taper off as the week progresses. . After discussing options, she decided to discontinue Mounjaro and focus on the nutrition plan alone. - Discontinue Mounjaro We are attributing the GI symptoms to the medication, but with her history of iron deficiency anemia, would consider GI re-evaluation as may want further work up, ? EGD to rule out other etiology of GI upset as well.  She did have a colonoscopy 03/2021 with sigmoid polyp - negative path.   - Focus on nutrition plan- Category 3  - Schedule follow-up in six weeks  Type 2 Diabetes Mellitus Blood glucose levels are not currently monitored, On Mounjaro 5 mg weekly,  but having significant side effects.  Discussed potential  use of metformin and other medications like Jardiance in the future.   Lab Results  Component Value Date   HGBA1C 6.7 (H) 10/05/2023   HGBA1C 6.1 (H) 03/05/2023   HGBA1C 5.3 07/24/2022   Lab Results  Component Value Date   MICROALBUR <0.7 10/12/2023   LDLCALC 97 10/05/2023   CREATININE 0.95 10/05/2023   Current plan is to focus on the nutrition plan and reassess in the next visit. - Focus on nutrition plan- Category 3 -  Reassess diabetes management in six weeks Would not recheck A1c currently until anemia addressed/improved.  She is working  on nutrition plan to decrease simple carbohydrates, increase lean proteins and exercise to promote weight loss and improve glycemic control .   Iron deficiencyAnemia She has chronic iron deficiency anemia with a hemoglobin level of 8.9. Most likely related to chronic heavy menses and has declined ablation.  She did have a colonoscopy 03/2021 - Pathology report from colonoscopy; DIAGNOSIS:  A. COLON POLYP, SIGMOID; COLD SNARE:  - FECAL DEBRIS ONLY.  - MUCOSAL TISSUE IS NOT IDENTIFIED ON GROSS OR MICROSCOPIC EXAMINATION.    She is coordinating with hematology for further treatment/iron infusion.  Anemia may contribute to fatigue and poor medication tolerance.  Discussed the possibility of an EGD to evaluate for potential causes of anemia, such as gastritis or malabsorption issues. - Coordinate with hematology for iron infusion - Consider GI consult for possible EGD to evaluate for potential causes of anemia  Vitamin D Deficiency Vitamin D is not at goal of 50.  Most recent vitamin D level was 45.4. She is on  prescription ergocalciferol 50,000 IU weekly. Lab Results  Component Value Date   VD25OH 45.4 10/05/2023   VD25OH 36.5 03/05/2023    Plan: Continue and refill  prescription ergocalciferol 50,000 IU weekly Low vitamin D levels can be associated with adiposity and may result in leptin resistance and weight gain. Also associated with fatigue.  Currently on vitamin D supplementation without any adverse effects such as nausea, vomiting or muscle weakness.   Vitals Temp: 97.9 F (36.6 C) BP: (!) 145/82 Pulse Rate: 89 SpO2: 100 %   Anthropometric Measurements Height: 5\' 4"  (1.626 m) Weight: 244 lb (110.7 kg) BMI (Calculated): 41.86 Weight at Last Visit: 239 lb Weight Lost Since Last Visit: 0 Weight Gained Since Last Visit: 5 lb Starting Weight: 243  lb Total Weight Loss (lbs): 1 lb (0.454 kg) Peak Weight: 273 lb   Body Composition  Body Fat %: 48.1 % Fat Mass (lbs): 117.4 lbs Muscle Mass (lbs): 120.4 lbs Total Body Water (lbs): 94.2 lbs Visceral Fat Rating : 15   Other Clinical Data Fasting: yes Labs: no Today's Visit #: 10 Starting Date: 03/05/23     ASSESSMENT AND PLAN:  Diet: Nicole Burns is currently in the action stage of change. As such, her goal is to continue with weight loss efforts. She has agreed to Category 3 Plan.  Exercise: Nicole Burns has been instructed to try a geriatric exercise plan and that some exercise is better than none for weight loss and overall health benefits.   Behavior Modification:  We discussed the following Behavioral Modification Strategies today: increasing lean protein intake, decreasing simple carbohydrates, increasing vegetables, increase H2O intake, increase high fiber foods, meal planning and cooking strategies, avoiding temptations, and planning for success. We discussed various medication options to help Nicole Burns with her weight loss efforts and we both agreed to stop Mounjaro for now and continue to work on nutritional and behavioral strategies  to promote weight loss.  We discussed considering starting metformin or possibly an SGLT2 medication like Jardiance if needed for glycemic control .  Return in about 6 weeks (around 12/16/2023).Nicole Burns She was informed of the importance of frequent follow up visits to maximize her success with intensive lifestyle modifications for her multiple health conditions.  Attestation Statements:   Reviewed by clinician on day of visit: allergies, medications, problem list, medical history, surgical history, family history, social history, and previous encounter notes.   Time spent on visit including pre-visit chart review and post-visit care and charting was 50 minutes.    Nicole Oftedahl, PA-C

## 2023-11-04 ENCOUNTER — Ambulatory Visit (INDEPENDENT_AMBULATORY_CARE_PROVIDER_SITE_OTHER): Payer: Self-pay | Admitting: Physician Assistant

## 2023-11-04 ENCOUNTER — Encounter (INDEPENDENT_AMBULATORY_CARE_PROVIDER_SITE_OTHER): Payer: Self-pay | Admitting: Physician Assistant

## 2023-11-04 VITALS — BP 145/82 | HR 89 | Temp 97.9°F | Ht 64.0 in | Wt 244.0 lb

## 2023-11-04 DIAGNOSIS — E1165 Type 2 diabetes mellitus with hyperglycemia: Secondary | ICD-10-CM

## 2023-11-04 DIAGNOSIS — D500 Iron deficiency anemia secondary to blood loss (chronic): Secondary | ICD-10-CM

## 2023-11-04 DIAGNOSIS — E1159 Type 2 diabetes mellitus with other circulatory complications: Secondary | ICD-10-CM

## 2023-11-04 DIAGNOSIS — E669 Obesity, unspecified: Secondary | ICD-10-CM

## 2023-11-04 DIAGNOSIS — E559 Vitamin D deficiency, unspecified: Secondary | ICD-10-CM

## 2023-11-04 DIAGNOSIS — Z7985 Long-term (current) use of injectable non-insulin antidiabetic drugs: Secondary | ICD-10-CM

## 2023-11-04 DIAGNOSIS — Z6841 Body Mass Index (BMI) 40.0 and over, adult: Secondary | ICD-10-CM

## 2023-11-04 DIAGNOSIS — E785 Hyperlipidemia, unspecified: Secondary | ICD-10-CM

## 2023-11-04 MED ORDER — VITAMIN D (ERGOCALCIFEROL) 1.25 MG (50000 UNIT) PO CAPS: 50000.0000 [IU] | ORAL_CAPSULE | ORAL | 0 refills | Status: DC

## 2023-11-22 ENCOUNTER — Other Ambulatory Visit (INDEPENDENT_AMBULATORY_CARE_PROVIDER_SITE_OTHER): Payer: Self-pay | Admitting: Physician Assistant

## 2023-11-22 DIAGNOSIS — E559 Vitamin D deficiency, unspecified: Secondary | ICD-10-CM

## 2023-12-14 NOTE — Progress Notes (Unsigned)
   SUBJECTIVE: Discussed the use of AI scribe software for clinical note transcription with the patient, who gave verbal consent to proceed.  Chief Complaint: Obesity  Interim History: ***  Genoveva is here to discuss her progress with her obesity treatment plan. She is on the {HWW Weight Loss Plan:210964005} and states she {CHL AMB IS/IS NOT:210130109} following her eating plan approximately *** % of the time. She states she {CHL AMB IS/IS NOT:210130109} exercising *** minutes *** times per week.   OBJECTIVE: Visit Diagnoses: Problem List Items Addressed This Visit     Type 2 diabetes mellitus with hyperglycemia (HCC) - Primary   Vitamin D  deficiency   Iron  deficiency anemia due to chronic blood loss (Chronic)   Other Visit Diagnoses       Hyperlipidemia associated with type 2 diabetes mellitus (HCC)         Hypertension associated with diabetes (HCC)         Obesity with starting BMI of 41.8           No data recorded No data recorded No data recorded No data recorded   ASSESSMENT AND PLAN:  Diet: Corryn {CHL AMB IS/IS NOT:210130109} currently in the action stage of change. As such, her goal is to {HWW Weight Loss Efforts:210964006}. She {HAS HAS FAO:13086} agreed to {HWW Weight Loss Plan:210964005}.  Exercise: Imagine has been instructed {HWW Exercise:210964007} for weight loss and overall health benefits.   Behavior Modification:  We discussed the following Behavioral Modification Strategies today: {HWW Behavior Modification:210964008}. We discussed various medication options to help Reveca with her weight loss efforts and we both agreed to ***.  No follow-ups on file.Aaron Aas She was informed of the importance of frequent follow up visits to maximize her success with intensive lifestyle modifications for her multiple health conditions.  Attestation Statements:   Reviewed by clinician on day of visit: allergies, medications, problem list, medical history, surgical  history, family history, social history, and previous encounter notes.   Time spent on visit including pre-visit chart review and post-visit care and charting was *** minutes.    Ysidro Ramsay, PA-C

## 2023-12-15 ENCOUNTER — Encounter (INDEPENDENT_AMBULATORY_CARE_PROVIDER_SITE_OTHER): Payer: Self-pay

## 2023-12-15 ENCOUNTER — Ambulatory Visit (INDEPENDENT_AMBULATORY_CARE_PROVIDER_SITE_OTHER): Admitting: Physician Assistant

## 2023-12-15 DIAGNOSIS — E559 Vitamin D deficiency, unspecified: Secondary | ICD-10-CM

## 2023-12-15 DIAGNOSIS — E1165 Type 2 diabetes mellitus with hyperglycemia: Secondary | ICD-10-CM

## 2023-12-15 DIAGNOSIS — E1169 Type 2 diabetes mellitus with other specified complication: Secondary | ICD-10-CM

## 2023-12-15 DIAGNOSIS — E1159 Type 2 diabetes mellitus with other circulatory complications: Secondary | ICD-10-CM

## 2023-12-15 DIAGNOSIS — D500 Iron deficiency anemia secondary to blood loss (chronic): Secondary | ICD-10-CM

## 2023-12-22 ENCOUNTER — Encounter (HOSPITAL_BASED_OUTPATIENT_CLINIC_OR_DEPARTMENT_OTHER): Admitting: Cardiovascular Disease

## 2023-12-28 ENCOUNTER — Other Ambulatory Visit (HOSPITAL_BASED_OUTPATIENT_CLINIC_OR_DEPARTMENT_OTHER): Payer: Self-pay | Admitting: Family

## 2024-01-14 ENCOUNTER — Ambulatory Visit: Admitting: Internal Medicine

## 2024-01-26 ENCOUNTER — Other Ambulatory Visit (HOSPITAL_BASED_OUTPATIENT_CLINIC_OR_DEPARTMENT_OTHER): Payer: Self-pay | Admitting: Family

## 2024-01-30 ENCOUNTER — Other Ambulatory Visit: Payer: Self-pay | Admitting: Internal Medicine

## 2024-02-02 NOTE — Telephone Encounter (Signed)
 Left message to return call to our office.  Need to know if pt is still taking medication, and if she requested it.

## 2024-03-03 ENCOUNTER — Encounter (HOSPITAL_BASED_OUTPATIENT_CLINIC_OR_DEPARTMENT_OTHER): Payer: Self-pay | Admitting: Family

## 2024-03-03 ENCOUNTER — Ambulatory Visit (INDEPENDENT_AMBULATORY_CARE_PROVIDER_SITE_OTHER): Admitting: Family

## 2024-03-03 ENCOUNTER — Encounter (HOSPITAL_BASED_OUTPATIENT_CLINIC_OR_DEPARTMENT_OTHER): Payer: Self-pay

## 2024-03-03 VITALS — BP 128/80 | HR 89 | Ht 64.0 in | Wt 248.5 lb

## 2024-03-03 DIAGNOSIS — E1165 Type 2 diabetes mellitus with hyperglycemia: Secondary | ICD-10-CM

## 2024-03-03 DIAGNOSIS — D509 Iron deficiency anemia, unspecified: Secondary | ICD-10-CM

## 2024-03-03 DIAGNOSIS — I10 Essential (primary) hypertension: Secondary | ICD-10-CM | POA: Diagnosis not present

## 2024-03-03 MED ORDER — PRAVASTATIN SODIUM 20 MG PO TABS: 20.0000 mg | ORAL_TABLET | ORAL | 3 refills | Status: DC

## 2024-03-03 MED ORDER — AMLODIPINE BESYLATE 10 MG PO TABS
10.0000 mg | ORAL_TABLET | Freq: Every day | ORAL | 1 refills | Status: AC
Start: 2024-03-03 — End: ?

## 2024-03-03 NOTE — Patient Instructions (Addendum)
 Medication Instructions:   START Pravastatin  20mg  three times per week on Mondays, Wednesdays, Friday  *If you need a refill on your cardiac medications before your next appointment, please call your pharmacy*  Lab Work: Your physician recommends that you return for lab work: CMET, CBC, A1c  Your physician recommends that you return for lab work in 3 months for fasting lipid panel, ALT  If you have labs (blood work) drawn today and your tests are completely normal, you will receive your results only by: MyChart Message (if you have MyChart) OR A paper copy in the mail If you have any lab test that is abnormal or we need to change your treatment, we will call you to review the results.  Testing/Procedures: Your EKG today looked good!  Follow-Up: At [facility], you and your health needs are our priority.  As part of our continuing mission to provide you with exceptional heart care, our providers are all part of one team.  This team includes your primary Cardiologist (physician) and Advanced Practice Providers or APPs (Physician Assistants and Nurse Practitioners) who all work together to provide you with the care you need, when you need it.  Your next appointment:   4-6 month(s) In Advanced [facility]pertension Clinic with Dr. Raford, Reche GORMAN Finder, NP or Kristin Alvstad,PharmD  We recommend signing up for the patient portal called MyChart.  Sign up information is provided on this After Visit Summary.  MyChart is used to connect with patients for Virtual Visits (Telemedicine).  Patients are able to view lab/test results, encounter notes, upcoming appointments, etc.  Non-urgent messages can be sent to your provider as well.   To learn more about what you can do with MyChart, go to [email or web address].   Other Instructions  Check blood pressure 2-3 times per week. If your blood pressure is consistently more than 130/80, call and let us  know.   [email or web address]

## 2024-03-03 NOTE — Progress Notes (Signed)
 Advanced Hypertension Clinic Assessment:    Date:  03/03/2024   ID:  Nicole Burns, DOB Dec 12, 1975, MRN [id number]  PCP:  Marylynn Verneita CROME, MD  Cardiologist:  None  Nephrologist:  Referring MD: Marylynn Verneita CROME, MD   CC: Hypertension  History of Present Illness:    Nicole Burns is a 48 y.o. female with a hx of HTN, DM2, obesity, migraine here to follow up in the Advanced Hypertension Clinic.   Established with Advanced Hypertension Clinic 12/11/20. Initially diagnosed with hypertension during pregnancy.   Presents today for follow up. Has not required PRN Lasix for many some time. Previously had successful weight loss on Ozempic but paused to focus on lifestyle changes with permission of [facility]. Notes due to busyness at work her lifestyle choices have not been as intentional recently. Has not been monitoring BP at home routinely. Does have upper arm cuff at home. Notes she discontinued Rosuvastatin due to myalgias.   Reports no shortness of breath nor dyspnea on exertion. Reports no chest pain, pressure, or tightness. No edema, orthopnea, PND. Reports no palpitations.    10/05/23 A1c 6.7  Previous antihypertensives: Valsartan  Hydrochlorothiazide - did not tolerate Hydralazine -did not tolerate Olmesartan - GI side effects   Past Medical History:  Diagnosis Date   Abortion, missed 08/09/2015   Anemia    Bilateral swelling of feet    Constipation    Diabetes mellitus without complication (HCC)    Family history of premature CAD 12/01/2022   Fatty liver    Gallbladder polyp    Hepatic cyst    High cholesterol    History of chicken pox    Hypertension    Leg swelling 12/01/2022   Lower extremity edema 09/10/2021   Migraine    After menses cycle.    Pure hypercholesterolemia 09/10/2021   Umbilical hernia 2014    Past Surgical History:  Procedure Laterality Date   CESAREAN SECTION     1993 & 2003   COLONOSCOPY WITH PROPOFOL N/A 04/26/2021    Procedure: COLONOSCOPY WITH PROPOFOL;  Surgeon: Janalyn Keene NOVAK, MD;  Location: [facility] ENDOSCOPY;  Service: Endoscopy;  Laterality: N/A;   DILATION AND EVACUATION N/A 08/01/2015   Procedure: DILATATION AND EVACUATION With Tissue Sent For Chromosomal Analysis;  Surgeon: Dickie Carder, MD;  Location: [facility] ORS;  Service: Gynecology;  Laterality: N/A;   HERNIA REPAIR  2007   OPERATIVE ULTRASOUND N/A 08/01/2015   Procedure: OPERATIVE ULTRASOUND;  Surgeon: Dickie Carder, MD;  Location: [facility] OR[facility];  Service: Gynecology;  Laterality: N/A;    Current Medications: Current Meds  Medication Sig   Docusate Calcium (STOOL SOFTENER PO) Take by mouth. 2 capsules daily   famotidine-calcium carbonate-magnesium hydroxide (PEPCID COMPLETE) 10-800-165 MG chewable tablet Chew 1 tablet by mouth 2 (two) times daily as needed.   Ferrous Fumarate (HEMOCYTE - 106 MG FE) 324 (106 Fe) MG TABS tablet Take 1 tablet by mouth daily.   HOMEOPATHIC PRODUCTS PO Take by mouth. MegaFood Blood Builder - vitamin C 15 mg, folic acid 400 mcg, Vitamin B12 30 mcg, Iron 26 mg   [START ON 03/04/2024] pravastatin (PRAVACHOL) 20 MG tablet Take 1 tablet (20 mg total) by mouth 3 (three) times a week.   spironolactone (ALDACTONE) 25 MG tablet TAKE 1 TABLET (25 MG TOTAL) BY MOUTH DAILY.   traZODone (DESYREL) 50 MG tablet Take 0.5-1 tablets (25-50 mg total) by mouth at bedtime as needed for sleep.   Vitamin D, Ergocalciferol, (DRISDOL) 1.25 MG (50000  UNIT) CAPS capsule Take 1 capsule (50,000 Units total) by mouth every 7 (seven) days.   [DISCONTINUED] amLODipine  (NORVASC ) 10 MG tablet Take 1 tablet (10 mg total) by mouth daily.     Allergies:   Lisinopril , Banana, Olmesartan , Valsartan , Maxzide [hydrochlorothiazide -triamterene ], and Vicodin [hydrocodone-acetaminophen]   Social History   Socioeconomic History   Marital status: Married    Spouse name: Not on file   Number of children: Not on file   Years of education: Not on file    Highest education level: Not on file  Occupational History   Occupation: [employer] account manager, Armed forces training and education officer  Tobacco Use   Smoking status: Never   Smokeless tobacco: Never  Vaping Use   Vaping status: Never Used  Substance and Sexual Activity   Alcohol use: No   Drug use: No   Sexual activity: Yes  Other Topics Concern   Not on file  Social History Narrative   Not on file   Social Drivers of Health   Financial Resource Strain: Low Risk  (09/10/2021)   Overall Financial Resource Strain (CARDIA)    Difficulty of Paying Living Expenses: Not hard at all  Food Insecurity: No Food Insecurity (09/10/2021)   Hunger Vital Sign    Worried About Running Out of Food in the Last Year: Never true    Ran Out of Food in the Last Year: Never true  Transportation Needs: No Transportation Needs (09/10/2021)   PRAPARE - Administrator, Civil Service (Medical): No    Lack of Transportation (Non-Medical): No  Physical Activity: Inactive (09/10/2021)   Exercise Vital Sign    Days of Exercise per Week: 0 days    Minutes of Exercise per Session: 0 min  Stress: Not on file  Social Connections: Not on file     Family History: The patient's family history includes Breast cancer in her maternal grandmother; Cancer in her maternal grandmother; Depression in her mother; Diabetes in her father and paternal grandmother; Heart disease in her father; Hyperlipidemia in her father and maternal grandmother; Hypertension in her father, mother, and sister; Kidney disease in her paternal grandmother; Obesity in her mother; Sleep apnea in her mother; Stroke in her father; Thyroid  disease in her mother.  ROS:   Please see the history of present illness.     All other systems reviewed and are negative.  EKGs/Labs/Other Studies Reviewed:    EKG Interpretation Date/Time:  Thursday March 03 2024 15:58:06 EDT Ventricular Rate:  89 PR Interval:  144 QRS Duration:  90 QT Interval:  348 QTC  Calculation: 423 R Axis:   17  Text Interpretation: Normal sinus rhythm Nonspecific T wave abnormality  No acute ST/T wave changes Confirmed by Vannie Mora ([id number]) on 03/03/2024 4:08:05 PM    Recent Labs: 10/05/2023: ALT 89; BUN 10; Creatinine, Ser 0.95; Hemoglobin 8.9; Platelets 456; Potassium 4.4; Sodium 139; TSH 1.310   Recent Lipid Panel    Component Value Date/Time   CHOL 153 10/05/2023 1236   TRIG 54 10/05/2023 1236   HDL 45 10/05/2023 1236   CHOLHDL 3.5 12/01/2022 0924   CHOLHDL 3 07/24/2022 1045   VLDL 14.2 07/24/2022 1045   LDLCALC 97 10/05/2023 1236   LDLDIRECT 102.0 07/24/2022 1045    Physical Exam:   VS:  BP 134/84 (BP Location: Left Arm, Patient Position: Sitting, Cuff Size: Large)   Pulse 89   Ht 5' 4 (1.626 m)   Wt 248 lb 8 oz (112.7 kg)   SpO2 99%  BMI 42.65 kg/m  , BMI Body mass index is 42.65 kg/m. GENERAL:  Well appearing, overweight HEENT: Pupils equal round and reactive, fundi not visualized, oral mucosa unremarkable NECK:  No jugular venous distention, waveform within normal limits, carotid upstroke brisk and symmetric, no bruits, no thyromegaly LYMPHATICS:  No cervical adenopathy LUNGS:  Clear to auscultation bilaterally HEART:  RRR.  PMI not displaced or sustained,S1 and S2 within normal limits, no S3, no S4, no clicks, no rubs, no murmurs ABD:  Flat, positive bowel sounds normal in frequency in pitch, no bruits, no rebound, no guarding, no midline pulsatile mass, no hepatomegaly, no splenomegaly EXT:  2 plus pulses throughout, no edema, no cyanosis no clubbing SKIN:  No rashes no nodules NEURO:  Cranial nerves II through XII grossly intact, motor grossly intact throughout PSYCH:  Cognitively intact, oriented to person place and time   ASSESSMENT/PLAN:    HTN - BP at goal on repeat. Continue Amlodipine  10mg  daily, Spironolactone  25mg  daily. Refills provided. Discussed to monitor BP at home at least 2 hours after medications and sitting for 5-10  minutes. CMET for monitoring.   DM2 / Obesity - Weight loss via diet and exercise encouraged. Discussed the impact being overweight would have on cardiovascular risk. Update A1c.   IDA - update CBC.  HLD - did not tolerate rosuvastatin  due to myalgias. Rx pravastatin  20mg  three times per week. Mychart message in 2-3 weeks to ensure tolerating. If so, plan to repeat lipid panel in 3 months.   Screening for Secondary Hypertension:     12/11/2020   11:50 AM  Causes  Drugs/Herbals Screened     - Comments 2 Coke Zero, ibuprofen     Relevant Labs/Studies:    Latest Ref Rng & Units 10/05/2023   12:36 PM 03/05/2023    9:03 AM 12/01/2022    9:24 AM  Basic Labs  Sodium 134 - 144 mmol/L 139  138  140   Potassium 3.5 - 5.2 mmol/L 4.4  4.5  4.9   Creatinine 0.57 - 1.00 mg/dL 9.04  9.00  9.00        Latest Ref Rng & Units 10/05/2023   12:36 PM 03/05/2023    9:03 AM  Thyroid    TSH 0.450 - 4.500 uIU/mL 1.310  1.770                  Disposition:    FU with MD/APP/PharmD in 4-6 months    Medication Adjustments/Labs and Tests Ordered: Current medicines are reviewed at length with the patient today.  Concerns regarding medicines are outlined above.  Orders Placed This Encounter  Procedures   CBC   Comprehensive metabolic panel with GFR   HgB J8r   Iron , TIBC and Ferritin Panel   EKG 12-Lead   Meds ordered this encounter  Medications   amLODipine  (NORVASC ) 10 MG tablet    Sig: Take 1 tablet (10 mg total) by mouth daily.    Dispense:  90 tablet    Refill:  1    Supervising Provider:   CHRISTOPHER, BRIDGETTE [[id number]]   pravastatin  (PRAVACHOL ) 20 MG tablet    Sig: Take 1 tablet (20 mg total) by mouth 3 (three) times a week.    Dispense:  40 tablet    Refill:  3    Supervising Provider:   LONNI SLAIN [[id number]]     Signed, Reche GORMAN Finder, NP  03/03/2024 7:21 PM    [facility]

## 2024-04-04 ENCOUNTER — Other Ambulatory Visit: Payer: Self-pay | Admitting: Internal Medicine

## 2024-05-10 ENCOUNTER — Other Ambulatory Visit (HOSPITAL_BASED_OUTPATIENT_CLINIC_OR_DEPARTMENT_OTHER): Payer: Self-pay | Admitting: Family

## 2024-05-26 ENCOUNTER — Encounter: Payer: Self-pay | Admitting: Oncology

## 2024-06-30 ENCOUNTER — Encounter: Payer: Self-pay | Admitting: Oncology

## 2024-06-30 DIAGNOSIS — Z0289 Encounter for other administrative examinations: Secondary | ICD-10-CM

## 2024-07-07 ENCOUNTER — Ambulatory Visit (INDEPENDENT_AMBULATORY_CARE_PROVIDER_SITE_OTHER): Admitting: Family

## 2024-07-07 ENCOUNTER — Encounter: Payer: Self-pay | Admitting: Oncology

## 2024-07-07 VITALS — BP 152/88 | HR 75 | Ht 64.0 in | Wt 256.3 lb

## 2024-07-07 DIAGNOSIS — E782 Mixed hyperlipidemia: Secondary | ICD-10-CM | POA: Diagnosis not present

## 2024-07-07 DIAGNOSIS — E119 Type 2 diabetes mellitus without complications: Secondary | ICD-10-CM | POA: Diagnosis not present

## 2024-07-07 DIAGNOSIS — D500 Iron deficiency anemia secondary to blood loss (chronic): Secondary | ICD-10-CM | POA: Diagnosis not present

## 2024-07-07 DIAGNOSIS — I10 Essential (primary) hypertension: Secondary | ICD-10-CM | POA: Diagnosis not present

## 2024-07-07 NOTE — Progress Notes (Unsigned)
 [facility] Assessment:    Date:  07/07/2024   ID:  Nicole Burns, DOB [birth year], MRN [id number]  PCP:  Marylynn Verneita CROME, MD  Cardiologist:  None  Nephrologist:  Referring MD: Marylynn Verneita CROME, MD   CC: Hypertension  History of Present Illness:    Nicole Burns is a 48 y.o. female with a hx of HTN, DM2, obesity, migraine here to follow up in the [facility].   Established with [facility] 12/11/20. Initially diagnosed with hypertension during pregnancy.   Presents today for follow up. Has not required PRN Lasix  for many some time. Previously had successful weight loss on Ozempic  but paused to focus on lifestyle changes with permission of [facility]. Notes due to busyness at work her lifestyle choices have not been as intentional recently. Has not been monitoring BP at home routinely. Does have upper arm cuff at home. Notes she discontinued Rosuvastatin  due to myalgias.   Reports no shortness of breath nor dyspnea on exertion. Reports no chest pain, pressure, or tightness. No edema, orthopnea, PND. Reports no palpitations.    Daughter came home sick and has developed mild URI symptoms. Shei s currently using cough drop as her throat was irritated. Does not feel stopped up but feels congestion.  COngestion both in her head/sinus as well as in her chest. DI dimprove some with peppermint oild. [place]nia which is minimally productive. Started feeling bad on Tuesday.   Notes stress at work and being in a [place] [employer] Saturday.   Reports her BP periodically and reports readings have been good overall.   She reports she did not start pravastatin  as was told by her local pharmacist that it causes memorty loss.   She was consdiering trying to rosuvastatin    10/05/23 A1c 6.7  'black girl vitamind'   Previous antihypertensives: Valsartan   Hydrochlorothiazide  - did not tolerate Hydralazine  -did not tolerate Olmesartan  - GI side  effects   Past Medical History:  Diagnosis Date   Abortion, missed 08/09/2015   Anemia    Bilateral swelling of feet    Constipation    Diabetes mellitus without complication (HCC)    Family history of premature CAD 12/01/2022   Fatty liver    Gallbladder polyp    Hepatic cyst    High cholesterol    History of chicken pox    Hypertension    Leg swelling 12/01/2022   Lower extremity edema 09/10/2021   Migraine    After menses cycle.    Pure hypercholesterolemia 09/10/2021   Umbilical hernia 2014    Past Surgical History:  Procedure Laterality Date   CESAREAN SECTION     1993 & 2003   COLONOSCOPY WITH PROPOFOL  N/A 04/26/2021   Procedure: COLONOSCOPY WITH PROPOFOL ;  Surgeon: Janalyn Keene NOVAK, MD;  Location: [facility];  Service: Endoscopy;  Laterality: N/A;   DILATION AND EVACUATION N/A 08/01/2015   Procedure: DILATATION AND EVACUATION With Tissue Sent For Chromosomal Analysis;  Surgeon: Dickie Carder, MD;  Location: [facility];  Service: Gynecology;  Laterality: N/A;   HERNIA REPAIR  2007   OPERATIVE ULTRASOUND N/A 08/01/2015   Procedure: OPERATIVE ULTRASOUND;  Surgeon: Dickie Carder, MD;  Location: [facility];  Service: Gynecology;  Laterality: N/A;    Current Medications: Current Meds  Medication Sig   amLODipine  (NORVASC ) 10 MG tablet Take 1 tablet (10 mg total) by mouth daily.   Docusate Calcium  (STOOL SOFTENER PO) Take by mouth. 2 capsules daily   famotidine -calcium  carbonate-magnesium hydroxide (  PEPCID  COMPLETE) [phone] MG chewable tablet Chew 1 tablet by mouth 2 (two) times daily as needed.   HOMEOPATHIC PRODUCTS PO Take by mouth. MegaFood Blood Builder - vitamin C 15 mg, folic acid 400 mcg, Vitamin B12 30 mcg, Iron  26 mg   spironolactone  (ALDACTONE ) 25 MG tablet TAKE 1 TABLET (25 MG TOTAL) BY MOUTH DAILY.   traZODone  (DESYREL ) 50 MG tablet TAKE 0.5-1 TABLETS BY MOUTH AT BEDTIME AS NEEDED FOR SLEEP.   Vitamin D , Ergocalciferol , (DRISDOL ) 1.25 MG (50000  UNIT) CAPS capsule Take 1 capsule (50,000 Units total) by mouth every 7 (seven) days.     Allergies:   Lisinopril , Banana, Olmesartan , Valsartan , Maxzide [hydrochlorothiazide -triamterene ], and Vicodin [hydrocodone-acetaminophen]   Social History   Socioeconomic History   Marital status: Married    Spouse name: Not on file   Number of children: Not on file   Years of education: Not on file   Highest education level: Not on file  Occupational History   Occupation: [employer] Account Manager, [employer]  Tobacco Use   Smoking status: Never   Smokeless tobacco: Never  Vaping Use   Vaping status: Never Used  Substance and Sexual Activity   Alcohol use: No   Drug use: No   Sexual activity: Yes  Other Topics Concern   Not on file  Social History Narrative   Not on file   Social Drivers of Health   Tobacco Use: Low Risk (07/07/2024)   Patient History    Smoking Tobacco Use: Never    Smokeless Tobacco Use: Never    Passive Exposure: Not on file  Financial Resource Strain: Low Risk (09/10/2021)   Overall Financial Resource Strain (CARDIA)    Difficulty of Paying Living Expenses: Not hard at all  Food Insecurity: No Food Insecurity (09/10/2021)   Hunger Vital Sign    Worried About Running Out of Food in the Last Year: Never true    Ran Out of Food in the Last Year: Never true  Transportation Needs: No Transportation Needs (09/10/2021)   PRAPARE - Administrator, Civil Service (Medical): No    Lack of Transportation (Non-Medical): No  Physical Activity: Inactive (09/10/2021)   Exercise Vital Sign    Days of Exercise per Week: 0 days    Minutes of Exercise per Session: 0 min  Stress: Not on file  Social Connections: Not on file  Depression (PHQ2-9): Low Risk (10/12/2023)   Depression (PHQ2-9)    PHQ-2 Score: 0  Alcohol Screen: Low Risk (09/10/2021)   Alcohol Screen    Last Alcohol Screening Score (AUDIT): 0  Housing: Low Risk (09/10/2021)   Housing    Last Housing  Risk Score: 0  Utilities: Not on file  Health Literacy: Not on file     Family History: The patient's family history includes Breast cancer in her maternal grandmother; Cancer in her maternal grandmother; Depression in her mother; Diabetes in her father and paternal grandmother; Heart disease in her father; Hyperlipidemia in her father and maternal grandmother; Hypertension in her father, mother, and sister; Kidney disease in her paternal grandmother; Obesity in her mother; Sleep apnea in her mother; Stroke in her father; Thyroid  disease in her mother.  ROS:   Please see the history of present illness.     All other systems reviewed and are negative.  EKGs/Labs/Other Studies Reviewed:         Recent Labs: 10/05/2023: ALT 89; BUN 10; Creatinine, Ser 0.95; Hemoglobin 8.9; Platelets 456; Potassium 4.4; Sodium 139;  TSH 1.310   Recent Lipid Panel    Component Value Date/Time   CHOL 153 10/05/2023 1236   TRIG 54 10/05/2023 1236   HDL 45 10/05/2023 1236   CHOLHDL 3.5 12/01/2022 0924   CHOLHDL 3 07/24/2022 1045   VLDL 14.2 07/24/2022 1045   LDLCALC 97 10/05/2023 1236   LDLDIRECT 102.0 07/24/2022 1045    Physical Exam:   VS:  BP (!) 162/98   Pulse 75   Ht 5' 4 (1.626 m)   Wt 256 lb 4.8 oz (116.3 kg)   SpO2 92%   BMI 43.99 kg/m  , BMI Body mass index is 43.99 kg/m. GENERAL:  Well appearing, overweight HEENT: Pupils equal round and reactive, fundi not visualized, oral mucosa unremarkable NECK:  No jugular venous distention, waveform within normal limits, carotid upstroke brisk and symmetric, no bruits, no thyromegaly LYMPHATICS:  No cervical adenopathy LUNGS:  Clear to auscultation bilaterally HEART:  RRR.  PMI not displaced or sustained,S1 and S2 within normal limits, no S3, no S4, no clicks, no rubs, no murmurs ABD:  Flat, positive bowel sounds normal in frequency in pitch, no bruits, no rebound, no guarding, no midline pulsatile mass, no hepatomegaly, no splenomegaly EXT:  2  plus pulses throughout, no edema, no cyanosis no clubbing SKIN:  No rashes no nodules NEURO:  Cranial nerves II through XII grossly intact, motor grossly intact throughout PSYCH:  Cognitively intact, oriented to person place and time   ASSESSMENT/PLAN:    HTN - BP at goal on repeat. Continue Amlodipine  10mg  daily, Spironolactone  25mg  daily. Refills provided. Discussed to monitor BP at home at least 2 hours after medications and sitting for 5-10 minutes. CMET for monitoring.   DM2 / Obesity - Weight loss via diet and exercise encouraged. Discussed the impact being overweight would have on cardiovascular risk. Update A1c.   IDA - update CBC.  HLD - did not tolerate rosuvastatin  due to myalgias. Rx pravastatin  20mg  three times per week. Mychart message in 2-3 weeks to ensure tolerating. If so, plan to repeat lipid panel in 3 months.   Screening for Secondary Hypertension:     12/11/2020   11:50 AM  Causes  Drugs/Herbals Screened     - Comments 2 Coke Zero, ibuprofen     Relevant Labs/Studies:    Latest Ref Rng & Units 10/05/2023   12:36 PM 03/05/2023    9:03 AM 12/01/2022    9:24 AM  Basic Labs  Sodium 134 - 144 mmol/L 139  138  140   Potassium 3.5 - 5.2 mmol/L 4.4  4.5  4.9   Creatinine 0.57 - 1.00 mg/dL 9.04  9.00  9.00        Latest Ref Rng & Units 10/05/2023   12:36 PM 03/05/2023    9:03 AM  Thyroid    TSH 0.450 - 4.500 uIU/mL 1.310  1.770                  Disposition:    FU with MD/APP/PharmD in 4-6 months    Medication Adjustments/Labs and Tests Ordered: Current medicines are reviewed at length with the patient today.  Concerns regarding medicines are outlined above.  No orders of the defined types were placed in this encounter.  No orders of the defined types were placed in this encounter.    Signed, Reche GORMAN Finder, NP  07/07/2024 4:03 PM    [facility]

## 2024-07-07 NOTE — Patient Instructions (Signed)
 Medication Instructions:  Continue your current medications.   *If you need a refill on your cardiac medications before your next appointment, please call your pharmacy*  Lab Work: Your physician recommends that you return for lab work in the next 3 weeks for fasting labs.   If you have labs (blood work) drawn today and your tests are completely normal, you will receive your results only by: MyChart Message (if you have MyChart) OR A paper copy in the mail If you have any lab test that is abnormal or we need to change your treatment, we will call you to review the results.  Follow-Up: At [facility], you and your health needs are our priority.  As part of our continuing mission to provide you with exceptional heart care, our providers are all part of one team.  This team includes your primary Cardiologist (physician) and Advanced Practice Providers or APPs (Physician Assistants and Nurse Practitioners) who all work together to provide you with the care you need, when you need it.  Your next appointment:   2-3 months with Advanced Hypertension Clinic: Annabella Scarce, MD, Reche Finder, NP, or Allean Mink, PharmD    We recommend signing up for the patient portal called MyChart.  Sign up information is provided on this After Visit Summary.  MyChart is used to connect with patients for Virtual Visits (Telemedicine).  Patients are able to view lab/test results, encounter notes, upcoming appointments, etc.  Non-urgent messages can be sent to your provider as well.   To learn more about what you can do with MyChart, go to [email or web address].   Other Instructions  Check blood pressure 3 times per week and if consistently more than 130, please let us  know!  Recommend using Guafenesin (Muccinex) *not the DM version* just the original blud bottle or Coricidin brand cold products.  You could also safely use Flonase  nasal spray once per day.

## 2024-07-09 ENCOUNTER — Other Ambulatory Visit: Payer: Self-pay | Admitting: Internal Medicine

## 2024-07-11 NOTE — Telephone Encounter (Signed)
 LMTCB. Need to let pt know that medication has been refilled for 30 days due to being overdue for an appt. Please schedule pt for a follow up appt with Dr. Tullo when she returns the call.

## 2024-07-19 ENCOUNTER — Encounter: Payer: Self-pay | Admitting: Oncology

## 2024-08-01 ENCOUNTER — Encounter: Payer: Self-pay | Admitting: Oncology

## 2024-08-02 ENCOUNTER — Encounter (INDEPENDENT_AMBULATORY_CARE_PROVIDER_SITE_OTHER): Payer: Self-pay | Admitting: *Deleted

## 2024-08-02 NOTE — Progress Notes (Unsigned)
" ° °  SUBJECTIVE: Discussed the use of AI scribe software for clinical note transcription with the patient, who gave verbal consent to proceed.  Chief Complaint: Obesity  Interim History: Re-establishing care. Last in office visit 11/04/23.  Nicole Burns is here to discuss her progress with her obesity treatment plan. She is on the {HWW Weight Loss Plan:210964005} and states she {CHL AMB IS/IS NOT:210130109} following her eating plan approximately *** % of the time. She states she {CHL AMB IS/IS NOT:210130109} exercising *** minutes *** times per week.   OBJECTIVE: Visit Diagnoses: Problem List Items Addressed This Visit     Type 2 diabetes mellitus with hyperglycemia (HCC) - Primary   Vitamin D  deficiency   Iron  deficiency anemia due to chronic blood loss (Chronic)   Other Visit Diagnoses       Hypertension associated with diabetes (HCC)         Hyperlipidemia associated with type 2 diabetes mellitus (HCC)         Obesity with starting BMI of 41.8           No data recorded No data recorded No data recorded No data recorded   ASSESSMENT AND PLAN:  Diet: Nicole Burns {CHL AMB IS/IS NOT:210130109} currently in the action stage of change. As such, her goal is to {HWW Weight Loss Efforts:210964006}. She {HAS HAS WNU:81165} agreed to {HWW Weight Loss Plan:210964005}.  Exercise: Nicole Burns has been instructed {HWW Exercise:210964007} for weight loss and overall health benefits.   Behavior Modification:  We discussed the following Behavioral Modification Strategies today: {HWW Behavior Modification:210964008}. We discussed various medication options to help Nicole Burns with her weight loss efforts and we both agreed to ***.  No follow-ups on file.Nicole Burns She was informed of the importance of frequent follow up visits to maximize her success with intensive lifestyle modifications for her multiple health conditions.  Attestation Statements:   Reviewed by clinician on day of visit: allergies,  medications, problem list, medical history, surgical history, family history, social history, and previous encounter notes.   Time spent on visit including pre-visit chart review and post-visit care and charting was *** minutes.    Aleane Wesenberg, PA-C  "

## 2024-08-03 ENCOUNTER — Ambulatory Visit (INDEPENDENT_AMBULATORY_CARE_PROVIDER_SITE_OTHER): Admitting: Physician Assistant

## 2024-08-03 ENCOUNTER — Encounter (INDEPENDENT_AMBULATORY_CARE_PROVIDER_SITE_OTHER): Payer: Self-pay | Admitting: Physician Assistant

## 2024-08-03 ENCOUNTER — Encounter: Payer: Self-pay | Admitting: Oncology

## 2024-08-03 VITALS — BP 127/85 | HR 76 | Temp 98.2°F | Ht 64.0 in | Wt 244.0 lb

## 2024-08-03 DIAGNOSIS — E1159 Type 2 diabetes mellitus with other circulatory complications: Secondary | ICD-10-CM | POA: Diagnosis not present

## 2024-08-03 DIAGNOSIS — D500 Iron deficiency anemia secondary to blood loss (chronic): Secondary | ICD-10-CM

## 2024-08-03 DIAGNOSIS — I152 Hypertension secondary to endocrine disorders: Secondary | ICD-10-CM

## 2024-08-03 DIAGNOSIS — E1165 Type 2 diabetes mellitus with hyperglycemia: Secondary | ICD-10-CM | POA: Diagnosis not present

## 2024-08-03 DIAGNOSIS — I10 Essential (primary) hypertension: Secondary | ICD-10-CM

## 2024-08-03 DIAGNOSIS — Z7985 Long-term (current) use of injectable non-insulin antidiabetic drugs: Secondary | ICD-10-CM

## 2024-08-03 DIAGNOSIS — E1169 Type 2 diabetes mellitus with other specified complication: Secondary | ICD-10-CM | POA: Diagnosis not present

## 2024-08-03 DIAGNOSIS — E785 Hyperlipidemia, unspecified: Secondary | ICD-10-CM | POA: Diagnosis not present

## 2024-08-03 DIAGNOSIS — E669 Obesity, unspecified: Secondary | ICD-10-CM

## 2024-08-03 DIAGNOSIS — E559 Vitamin D deficiency, unspecified: Secondary | ICD-10-CM | POA: Diagnosis not present

## 2024-08-03 DIAGNOSIS — Z6841 Body Mass Index (BMI) 40.0 and over, adult: Secondary | ICD-10-CM

## 2024-08-03 DIAGNOSIS — D509 Iron deficiency anemia, unspecified: Secondary | ICD-10-CM | POA: Diagnosis not present

## 2024-08-03 MED ORDER — VITAMIN D (ERGOCALCIFEROL) 1.25 MG (50000 UNIT) PO CAPS: 50000.0000 [IU] | ORAL_CAPSULE | ORAL | 0 refills | Status: AC

## 2024-08-03 MED ORDER — TIRZEPATIDE 2.5 MG/0.5ML ~~LOC~~ SOAJ: 2.5000 mg | SUBCUTANEOUS | 1 refills | Status: AC

## 2024-08-04 ENCOUNTER — Other Ambulatory Visit (INDEPENDENT_AMBULATORY_CARE_PROVIDER_SITE_OTHER): Payer: Self-pay | Admitting: Physician Assistant

## 2024-08-04 ENCOUNTER — Telehealth (INDEPENDENT_AMBULATORY_CARE_PROVIDER_SITE_OTHER): Payer: Self-pay

## 2024-08-04 ENCOUNTER — Ambulatory Visit (INDEPENDENT_AMBULATORY_CARE_PROVIDER_SITE_OTHER): Payer: Self-pay | Admitting: Physician Assistant

## 2024-08-04 DIAGNOSIS — D500 Iron deficiency anemia secondary to blood loss (chronic): Secondary | ICD-10-CM

## 2024-08-04 LAB — COMPREHENSIVE METABOLIC PANEL WITH GFR
ALT: 12 IU/L (ref 0–32)
AST: 16 IU/L (ref 0–40)
Albumin: 4 g/dL (ref 3.9–4.9)
Alkaline Phosphatase: 78 IU/L (ref 41–116)
BUN/Creatinine Ratio: 11 (ref 9–23)
BUN: 10 mg/dL (ref 6–24)
Bilirubin Total: 0.3 mg/dL (ref 0.0–1.2)
CO2: 20 mmol/L (ref 20–29)
Calcium: 9.8 mg/dL (ref 8.7–10.2)
Chloride: 102 mmol/L (ref 96–106)
Creatinine, Ser: 0.91 mg/dL (ref 0.57–1.00)
Globulin, Total: 3.4 g/dL (ref 1.5–4.5)
Glucose: 114 mg/dL — ABNORMAL HIGH (ref 70–99)
Potassium: 5 mmol/L (ref 3.5–5.2)
Sodium: 136 mmol/L (ref 134–144)
Total Protein: 7.4 g/dL (ref 6.0–8.5)
eGFR: 77 mL/min/1.73

## 2024-08-04 LAB — FE+TIBC+FER+B12+FOLIC+RETIC
Ferritin: 10 ng/mL — ABNORMAL LOW (ref 15–150)
Folate: 17.6 ng/mL
Iron Saturation: 5 % — CL (ref 15–55)
Iron: 19 ug/dL — ABNORMAL LOW (ref 27–159)
Retic Ct Pct: 1.3 % (ref 0.6–2.6)
Total Iron Binding Capacity: 404 ug/dL (ref 250–450)
UIBC: 385 ug/dL (ref 131–425)
Vitamin B-12: 728 pg/mL (ref 232–1245)

## 2024-08-04 LAB — LIPID PANEL WITH LDL/HDL RATIO
Cholesterol, Total: 157 mg/dL (ref 100–199)
HDL: 40 mg/dL
LDL Chol Calc (NIH): 104 mg/dL — ABNORMAL HIGH (ref 0–99)
LDL/HDL Ratio: 2.6 ratio (ref 0.0–3.2)
Triglycerides: 66 mg/dL (ref 0–149)
VLDL Cholesterol Cal: 13 mg/dL (ref 5–40)

## 2024-08-04 LAB — CBC WITH DIFFERENTIAL/PLATELET
Basophils Absolute: 0.1 x10E3/uL (ref 0.0–0.2)
Basos: 1 %
EOS (ABSOLUTE): 0.1 x10E3/uL (ref 0.0–0.4)
Eos: 3 %
Hematocrit: 34.3 % (ref 34.0–46.6)
Hemoglobin: 9.7 g/dL — ABNORMAL LOW (ref 11.1–15.9)
Immature Grans (Abs): 0 x10E3/uL (ref 0.0–0.1)
Immature Granulocytes: 0 %
Lymphocytes Absolute: 1.8 x10E3/uL (ref 0.7–3.1)
Lymphs: 32 %
MCH: 22 pg — ABNORMAL LOW (ref 26.6–33.0)
MCHC: 28.3 g/dL — ABNORMAL LOW (ref 31.5–35.7)
MCV: 78 fL — ABNORMAL LOW (ref 79–97)
Monocytes Absolute: 0.8 x10E3/uL (ref 0.1–0.9)
Monocytes: 14 %
Neutrophils Absolute: 2.8 x10E3/uL (ref 1.4–7.0)
Neutrophils: 50 %
Platelets: 401 x10E3/uL (ref 150–450)
RBC: 4.4 x10E6/uL (ref 3.77–5.28)
RDW: 16.1 % — ABNORMAL HIGH (ref 11.7–15.4)
WBC: 5.6 x10E3/uL (ref 3.4–10.8)

## 2024-08-04 LAB — INSULIN, RANDOM: INSULIN: 22.7 u[IU]/mL (ref 2.6–24.9)

## 2024-08-04 LAB — VITAMIN D 25 HYDROXY (VIT D DEFICIENCY, FRACTURES): Vit D, 25-Hydroxy: 29.2 ng/mL — ABNORMAL LOW (ref 30.0–100.0)

## 2024-08-04 LAB — HEMOGLOBIN A1C
Est. average glucose Bld gHb Est-mCnc: 146 mg/dL
Hgb A1c MFr Bld: 6.7 % — ABNORMAL HIGH (ref 4.8–5.6)

## 2024-08-04 MED ORDER — FERROUS FUMARATE 324 (106 FE) MG PO TABS: 1.0000 | ORAL_TABLET | Freq: Every day | ORAL | 1 refills | Status: AC

## 2024-08-04 NOTE — Telephone Encounter (Signed)
 Please advise

## 2024-08-04 NOTE — Telephone Encounter (Signed)
 [employer]  Prior Authorization  Message from Plan This request has been approved using information available on the patient's profile.     [id number];         Status:Approved; Review Type:Prior Auth;Coverage  Start Date:07/21/2024;      Coverage End Date:08/03/2025;

## 2024-08-05 ENCOUNTER — Other Ambulatory Visit: Payer: Self-pay | Admitting: Internal Medicine

## 2024-08-30 ENCOUNTER — Ambulatory Visit (INDEPENDENT_AMBULATORY_CARE_PROVIDER_SITE_OTHER): Admitting: Physician Assistant

## 2024-10-05 ENCOUNTER — Encounter (HOSPITAL_BASED_OUTPATIENT_CLINIC_OR_DEPARTMENT_OTHER): Admitting: Cardiovascular Disease
